# Patient Record
Sex: Female | Born: 1940 | Race: White | Hispanic: No | Marital: Married | Smoking: Never smoker
Health system: Southern US, Community
[De-identification: ages and names within clinical notes are randomized; demographics above are authoritative.]

## PROBLEM LIST (undated history)

## (undated) DIAGNOSIS — K5790 Diverticulosis of intestine, part unspecified, without perforation or abscess without bleeding: Secondary | ICD-10-CM

## (undated) DIAGNOSIS — F32A Depression, unspecified: Secondary | ICD-10-CM

## (undated) DIAGNOSIS — F419 Anxiety disorder, unspecified: Secondary | ICD-10-CM

## (undated) DIAGNOSIS — K449 Diaphragmatic hernia without obstruction or gangrene: Secondary | ICD-10-CM

## (undated) DIAGNOSIS — A159 Respiratory tuberculosis unspecified: Secondary | ICD-10-CM

## (undated) DIAGNOSIS — K317 Polyp of stomach and duodenum: Secondary | ICD-10-CM

## (undated) DIAGNOSIS — K635 Polyp of colon: Secondary | ICD-10-CM

## (undated) DIAGNOSIS — Z9289 Personal history of other medical treatment: Secondary | ICD-10-CM

## (undated) DIAGNOSIS — C4491 Basal cell carcinoma of skin, unspecified: Secondary | ICD-10-CM

## (undated) DIAGNOSIS — M797 Fibromyalgia: Secondary | ICD-10-CM

## (undated) DIAGNOSIS — E669 Obesity, unspecified: Secondary | ICD-10-CM

## (undated) DIAGNOSIS — K2210 Ulcer of esophagus without bleeding: Secondary | ICD-10-CM

## (undated) DIAGNOSIS — I7300 Raynaud's syndrome without gangrene: Secondary | ICD-10-CM

## (undated) DIAGNOSIS — K219 Gastro-esophageal reflux disease without esophagitis: Secondary | ICD-10-CM

## (undated) DIAGNOSIS — E039 Hypothyroidism, unspecified: Secondary | ICD-10-CM

## (undated) DIAGNOSIS — I10 Essential (primary) hypertension: Secondary | ICD-10-CM

## (undated) DIAGNOSIS — E785 Hyperlipidemia, unspecified: Secondary | ICD-10-CM

## (undated) DIAGNOSIS — F329 Major depressive disorder, single episode, unspecified: Secondary | ICD-10-CM

## (undated) DIAGNOSIS — R112 Nausea with vomiting, unspecified: Secondary | ICD-10-CM

## (undated) DIAGNOSIS — Z8249 Family history of ischemic heart disease and other diseases of the circulatory system: Secondary | ICD-10-CM

## (undated) DIAGNOSIS — E538 Deficiency of other specified B group vitamins: Secondary | ICD-10-CM

## (undated) DIAGNOSIS — G4730 Sleep apnea, unspecified: Secondary | ICD-10-CM

## (undated) DIAGNOSIS — G894 Chronic pain syndrome: Secondary | ICD-10-CM

## (undated) DIAGNOSIS — R0600 Dyspnea, unspecified: Secondary | ICD-10-CM

## (undated) DIAGNOSIS — M1990 Unspecified osteoarthritis, unspecified site: Secondary | ICD-10-CM

## (undated) DIAGNOSIS — M25551 Pain in right hip: Secondary | ICD-10-CM

## (undated) DIAGNOSIS — G588 Other specified mononeuropathies: Secondary | ICD-10-CM

## (undated) DIAGNOSIS — N3010 Interstitial cystitis (chronic) without hematuria: Secondary | ICD-10-CM

## (undated) DIAGNOSIS — E063 Autoimmune thyroiditis: Secondary | ICD-10-CM

## (undated) DIAGNOSIS — Z9889 Other specified postprocedural states: Secondary | ICD-10-CM

## (undated) HISTORY — DX: Gastro-esophageal reflux disease without esophagitis: K21.9

## (undated) HISTORY — DX: Interstitial cystitis (chronic) without hematuria: N30.10

## (undated) HISTORY — DX: Chronic pain syndrome: G89.4

## (undated) HISTORY — DX: Depression, unspecified: F32.A

## (undated) HISTORY — DX: Unspecified osteoarthritis, unspecified site: M19.90

## (undated) HISTORY — DX: Essential (primary) hypertension: I10

## (undated) HISTORY — DX: Autoimmune thyroiditis: E06.3

## (undated) HISTORY — DX: Obesity, unspecified: E66.9

## (undated) HISTORY — DX: Basal cell carcinoma of skin, unspecified: C44.91

## (undated) HISTORY — PX: LIPOMA EXCISION: SHX5283

## (undated) HISTORY — DX: Ulcer of esophagus without bleeding: K22.10

## (undated) HISTORY — DX: Other specified mononeuropathies: G58.8

## (undated) HISTORY — DX: Personal history of other medical treatment: Z92.89

## (undated) HISTORY — DX: Raynaud's syndrome without gangrene: I73.00

## (undated) HISTORY — DX: Hyperlipidemia, unspecified: E78.5

## (undated) HISTORY — DX: Polyp of colon: K63.5

## (undated) HISTORY — DX: Pain in right hip: M25.551

## (undated) HISTORY — DX: Diverticulosis of intestine, part unspecified, without perforation or abscess without bleeding: K57.90

## (undated) HISTORY — DX: Hypothyroidism, unspecified: E03.9

## (undated) HISTORY — DX: Deficiency of other specified B group vitamins: E53.8

## (undated) HISTORY — DX: Diaphragmatic hernia without obstruction or gangrene: K44.9

## (undated) HISTORY — DX: Polyp of stomach and duodenum: K31.7

## (undated) HISTORY — DX: Major depressive disorder, single episode, unspecified: F32.9

## (undated) HISTORY — DX: Fibromyalgia: M79.7

## (undated) HISTORY — DX: Family history of ischemic heart disease and other diseases of the circulatory system: Z82.49

## (undated) HISTORY — DX: Anxiety disorder, unspecified: F41.9

## (undated) HISTORY — PX: CATARACT EXTRACTION: SUR2

## (undated) HISTORY — DX: Sleep apnea, unspecified: G47.30

---
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
## 1980-05-21 HISTORY — PX: ABDOMINAL HYSTERECTOMY: SHX81

## 1997-05-21 HISTORY — PX: FOOT SURGERY: SHX648

## 1998-02-07 ENCOUNTER — Observation Stay (HOSPITAL_COMMUNITY): Admission: RE | Admit: 1998-02-07 | Discharge: 1998-02-08 | Payer: Self-pay | Admitting: Specialist

## 1998-06-24 ENCOUNTER — Other Ambulatory Visit: Admission: RE | Admit: 1998-06-24 | Discharge: 1998-06-24 | Payer: Self-pay | Admitting: Obstetrics & Gynecology

## 2000-03-07 ENCOUNTER — Ambulatory Visit (HOSPITAL_COMMUNITY): Admission: RE | Admit: 2000-03-07 | Discharge: 2000-03-07 | Payer: Self-pay | Admitting: Gastroenterology

## 2000-03-27 ENCOUNTER — Encounter: Payer: Self-pay | Admitting: Internal Medicine

## 2000-04-08 ENCOUNTER — Encounter (INDEPENDENT_AMBULATORY_CARE_PROVIDER_SITE_OTHER): Payer: Self-pay | Admitting: *Deleted

## 2000-05-21 DIAGNOSIS — C4491 Basal cell carcinoma of skin, unspecified: Secondary | ICD-10-CM

## 2000-05-21 HISTORY — DX: Basal cell carcinoma of skin, unspecified: C44.91

## 2000-06-13 ENCOUNTER — Ambulatory Visit (HOSPITAL_COMMUNITY): Admission: RE | Admit: 2000-06-13 | Discharge: 2000-06-13 | Payer: Self-pay | Admitting: Gastroenterology

## 2000-08-08 ENCOUNTER — Ambulatory Visit (HOSPITAL_COMMUNITY): Admission: RE | Admit: 2000-08-08 | Discharge: 2000-08-08 | Payer: Self-pay | Admitting: Gastroenterology

## 2001-03-31 HISTORY — PX: BASAL CELL CARCINOMA EXCISION: SHX1214

## 2001-07-29 ENCOUNTER — Ambulatory Visit (HOSPITAL_COMMUNITY): Admission: RE | Admit: 2001-07-29 | Discharge: 2001-07-29 | Payer: Self-pay | Admitting: Internal Medicine

## 2001-07-29 ENCOUNTER — Encounter: Payer: Self-pay | Admitting: Internal Medicine

## 2002-07-16 ENCOUNTER — Other Ambulatory Visit: Admission: RE | Admit: 2002-07-16 | Discharge: 2002-07-16 | Payer: Self-pay | Admitting: Obstetrics and Gynecology

## 2002-12-23 ENCOUNTER — Ambulatory Visit (HOSPITAL_COMMUNITY): Admission: RE | Admit: 2002-12-23 | Discharge: 2002-12-23 | Payer: Self-pay | Admitting: Family Medicine

## 2002-12-23 ENCOUNTER — Encounter: Payer: Self-pay | Admitting: Family Medicine

## 2003-05-22 HISTORY — PX: BLADDER REPAIR: SHX76

## 2003-08-11 ENCOUNTER — Other Ambulatory Visit: Admission: RE | Admit: 2003-08-11 | Discharge: 2003-08-11 | Payer: Self-pay | Admitting: Obstetrics and Gynecology

## 2003-09-17 ENCOUNTER — Ambulatory Visit (HOSPITAL_COMMUNITY): Admission: RE | Admit: 2003-09-17 | Discharge: 2003-09-17 | Payer: Self-pay | Admitting: Gastroenterology

## 2003-10-05 ENCOUNTER — Ambulatory Visit (HOSPITAL_COMMUNITY): Admission: RE | Admit: 2003-10-05 | Discharge: 2003-10-05 | Payer: Self-pay | Admitting: General Surgery

## 2003-10-05 ENCOUNTER — Encounter (INDEPENDENT_AMBULATORY_CARE_PROVIDER_SITE_OTHER): Payer: Self-pay | Admitting: Specialist

## 2004-03-14 ENCOUNTER — Ambulatory Visit (HOSPITAL_COMMUNITY): Admission: RE | Admit: 2004-03-14 | Discharge: 2004-03-14 | Payer: Self-pay | Admitting: Family Medicine

## 2004-07-19 ENCOUNTER — Ambulatory Visit: Payer: Self-pay | Admitting: Gastroenterology

## 2004-08-08 ENCOUNTER — Ambulatory Visit: Payer: Self-pay | Admitting: Gastroenterology

## 2006-01-14 ENCOUNTER — Ambulatory Visit: Payer: Self-pay | Admitting: Gastroenterology

## 2006-01-23 ENCOUNTER — Ambulatory Visit: Payer: Self-pay | Admitting: Gastroenterology

## 2006-01-28 ENCOUNTER — Ambulatory Visit: Payer: Self-pay | Admitting: Gastroenterology

## 2006-01-28 ENCOUNTER — Encounter: Payer: Self-pay | Admitting: Internal Medicine

## 2006-02-11 ENCOUNTER — Ambulatory Visit: Payer: Self-pay | Admitting: Gastroenterology

## 2006-02-19 ENCOUNTER — Encounter: Payer: Self-pay | Admitting: Internal Medicine

## 2006-02-19 ENCOUNTER — Encounter (INDEPENDENT_AMBULATORY_CARE_PROVIDER_SITE_OTHER): Payer: Self-pay | Admitting: *Deleted

## 2006-02-19 ENCOUNTER — Ambulatory Visit: Payer: Self-pay | Admitting: Gastroenterology

## 2006-03-04 ENCOUNTER — Ambulatory Visit: Payer: Self-pay | Admitting: Gastroenterology

## 2006-04-03 ENCOUNTER — Ambulatory Visit: Payer: Self-pay | Admitting: Gastroenterology

## 2006-04-03 LAB — CONVERTED CEMR LAB
CO2: 33 meq/L — ABNORMAL HIGH (ref 19–32)
Calcium: 9.4 mg/dL (ref 8.4–10.5)
Chloride: 101 meq/L (ref 96–112)
Creatinine, Ser: 1 mg/dL (ref 0.4–1.2)
Glucose, Bld: 97 mg/dL (ref 70–99)
Potassium: 3 meq/L — ABNORMAL LOW (ref 3.5–5.1)

## 2006-04-04 ENCOUNTER — Ambulatory Visit: Payer: Self-pay | Admitting: Gastroenterology

## 2006-04-16 ENCOUNTER — Ambulatory Visit: Payer: Self-pay | Admitting: Gastroenterology

## 2006-11-19 ENCOUNTER — Ambulatory Visit (HOSPITAL_COMMUNITY): Admission: RE | Admit: 2006-11-19 | Discharge: 2006-11-19 | Payer: Self-pay | Admitting: *Deleted

## 2006-12-20 HISTORY — PX: TRANSTHORACIC ECHOCARDIOGRAM: SHX275

## 2007-03-18 ENCOUNTER — Encounter (HOSPITAL_COMMUNITY): Admission: RE | Admit: 2007-03-18 | Discharge: 2007-04-17 | Payer: Self-pay | Admitting: Family Medicine

## 2007-04-22 ENCOUNTER — Encounter (HOSPITAL_COMMUNITY): Admission: RE | Admit: 2007-04-22 | Discharge: 2007-05-21 | Payer: Self-pay | Admitting: Family Medicine

## 2007-07-01 ENCOUNTER — Ambulatory Visit (HOSPITAL_COMMUNITY): Admission: RE | Admit: 2007-07-01 | Discharge: 2007-07-01 | Payer: Self-pay | Admitting: Family Medicine

## 2008-01-12 ENCOUNTER — Ambulatory Visit (HOSPITAL_COMMUNITY): Admission: RE | Admit: 2008-01-12 | Discharge: 2008-01-12 | Payer: Self-pay | Admitting: Family Medicine

## 2008-08-23 ENCOUNTER — Encounter (INDEPENDENT_AMBULATORY_CARE_PROVIDER_SITE_OTHER): Payer: Self-pay | Admitting: *Deleted

## 2009-01-19 HISTORY — PX: BLADDER SURGERY: SHX569

## 2009-02-15 ENCOUNTER — Ambulatory Visit (HOSPITAL_COMMUNITY): Admission: RE | Admit: 2009-02-15 | Discharge: 2009-02-16 | Payer: Self-pay | Admitting: Obstetrics and Gynecology

## 2009-02-18 ENCOUNTER — Ambulatory Visit (HOSPITAL_COMMUNITY): Admission: RE | Admit: 2009-02-18 | Discharge: 2009-02-18 | Payer: Self-pay | Admitting: Obstetrics and Gynecology

## 2009-08-16 ENCOUNTER — Ambulatory Visit (HOSPITAL_COMMUNITY): Admission: RE | Admit: 2009-08-16 | Discharge: 2009-08-16 | Payer: Self-pay | Admitting: Family Medicine

## 2009-09-07 ENCOUNTER — Telehealth (INDEPENDENT_AMBULATORY_CARE_PROVIDER_SITE_OTHER): Payer: Self-pay | Admitting: *Deleted

## 2009-10-21 ENCOUNTER — Encounter (INDEPENDENT_AMBULATORY_CARE_PROVIDER_SITE_OTHER): Payer: Self-pay | Admitting: *Deleted

## 2009-11-29 ENCOUNTER — Ambulatory Visit: Payer: Self-pay | Admitting: Internal Medicine

## 2009-11-29 DIAGNOSIS — K219 Gastro-esophageal reflux disease without esophagitis: Secondary | ICD-10-CM | POA: Insufficient documentation

## 2009-11-29 DIAGNOSIS — Z860100 Personal history of colon polyps, unspecified: Secondary | ICD-10-CM | POA: Insufficient documentation

## 2009-11-29 DIAGNOSIS — R1319 Other dysphagia: Secondary | ICD-10-CM

## 2009-12-02 ENCOUNTER — Telehealth: Payer: Self-pay | Admitting: Internal Medicine

## 2009-12-02 ENCOUNTER — Encounter (INDEPENDENT_AMBULATORY_CARE_PROVIDER_SITE_OTHER): Payer: Self-pay | Admitting: *Deleted

## 2009-12-09 ENCOUNTER — Encounter (INDEPENDENT_AMBULATORY_CARE_PROVIDER_SITE_OTHER): Payer: Self-pay | Admitting: *Deleted

## 2009-12-16 ENCOUNTER — Ambulatory Visit: Payer: Self-pay | Admitting: Internal Medicine

## 2009-12-20 ENCOUNTER — Ambulatory Visit: Payer: Self-pay | Admitting: Internal Medicine

## 2009-12-20 HISTORY — PX: UPPER GASTROINTESTINAL ENDOSCOPY: SHX188

## 2009-12-20 HISTORY — PX: COLONOSCOPY W/ BIOPSIES AND POLYPECTOMY: SHX1376

## 2009-12-23 ENCOUNTER — Encounter: Payer: Self-pay | Admitting: Internal Medicine

## 2010-04-20 DIAGNOSIS — Z9289 Personal history of other medical treatment: Secondary | ICD-10-CM

## 2010-04-20 HISTORY — DX: Personal history of other medical treatment: Z92.89

## 2010-05-25 HISTORY — PX: OTHER SURGICAL HISTORY: SHX169

## 2010-06-10 ENCOUNTER — Encounter: Payer: Self-pay | Admitting: Family Medicine

## 2010-06-20 NOTE — Procedures (Signed)
Summary: EGD: Dr. Doreatha Martin   EGD  Procedure date:  02/19/2006  Findings:      Location: [facility]  Findings: Benign Fundic gland and hyperplatic polyps   Patient Name: Audrey [name], Audrey [name] MRN: [id number] Procedure Procedures: Panendoscopy (EGD) CPT: 43235.    with biopsy(s)/brushing(s). CPT: D1846139.  Personnel: Endoscopist: Ulyess Mort, MD.  Referred By: Anderson Malta, DO.  Exam Location: Exam performed in Outpatient Clinic. Outpatient  Patient Consent: Procedure, Alternatives, Risks and Benefits discussed, consent obtained, from patient. Consent was obtained by the RN.  Indications Symptoms: Dysphagia. Dyspepsia, Abdominal pain,  History  Current Medications: Patient is not currently taking Coumadin.  Medical/Surgical History: Possible von willebrand's ,  Allergies: No known allergies.  Pre-Exam Physical: Entire physical exam was normal. Abnormal PE findings include: cushinoid .  Comments: Pt. history reviewed/updated, physical exam performed prior to initiation of sedation? Exam Exam Info: Maximum depth of insertion Duodenum, intended Duodenum. Patient position: on left side. Vocal cords visualized. Gastric retroflexion performed. Images taken. ASA Classification: II. Tolerance: good.  Sedation Meds: Patient assessed and found to be appropriate for moderate (conscious) sedation. Fentanyl 75 mcg. given IV. Versed 9 mg. given IV. Cetacaine Spray 2 sprays given aerosolized.  Monitoring: BP and pulse monitoring done. Oximetry used. Supplemental O2 given  Findings - HIATAL HERNIA: 3 cms. in length. mild stricture.  - MUCOSAL ABNORMALITY: Pyloric Sphincter to Jejunum. Granular mucosa.  POLYP: in Body. Maximum size: 6 mm. sessile polyp. Procedure: biopsy without cautery, Polyp sent to pathology. 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  POLYP: in Antrum. Maximum size: 12 mm. sessile polyp. Procedure: biopsy without cautery, not removed, sent to  pathology. Comments: submucosal lesion  bxs-5.  - MUCOSAL ABNORMALITY: Body to Antrum. Granular mucosa.   Assessment Abnormal examination, see findings above.  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   Events  Unplanned Intervention: No unplanned interventions were required.  Unplanned Events: There were no complications. Plans Medication(s): Await pathology. Continue current medications. PPI:   Patient Education: Patient given standard instructions for: Hiatal Hernia. Reflux. Stenosis / Stricture. Mucosal Abnormality. Polyps.  Disposition: After procedure patient sent to recovery. After recovery patient sent home.   CC:   Anderson Malta, MD   Arlan Organ, MD  This report was created from the original endoscopy report, which was reviewed and signed by the above listed endoscopist.

## 2010-06-20 NOTE — Procedures (Signed)
Summary: Colonoscopy  Patient: [name] Note: All result statuses are Final unless otherwise noted.  Tests: (1) Colonoscopy (COL)   COL Colonoscopy           DONE     [facility]     [address]     [place], [place]  [address]           COLONOSCOPY PROCEDURE REPORT           PATIENT:  Audrey, [name]  MR#:  [id number]     BIRTHDATE:  1940/10/12, 68 yrs. old  GENDER:  female     ENDOSCOPIST:  Iva Boop, MD, [facility]           PROCEDURE DATE:  12/20/2009     PROCEDURE:  Colonoscopy with snare polypectomy     ASA CLASS:  Class II     INDICATIONS:  surveillance and high-risk screening, history of     polyps two polyps removed/destroyed in 2006     MEDICATIONS:   There was residual sedation effect present from     prior procedure., Versed 2 mg IV           DESCRIPTION OF PROCEDURE:   After the risks benefits and     alternatives of the procedure were thoroughly explained, informed     consent was obtained.  Digital rectal exam was performed and     revealed no abnormalities.   The LB CF-[id number] endoscope     was introduced through the anus and advanced to the cecum, which     was identified by both the appendix and ileocecal valve, without     limitations.  The quality of the prep was excellent, using     MoviPrep.  The instrument was then slowly withdrawn as the colon     was fully examined. Insertion: : 4:04 minutes Withdrawal: 9:35     minutes     <<PROCEDUREIMAGES>>           FINDINGS:  Two polyps were found in the ascending colon. 6mm and     1mm polyps. Polyps were snared without cautery. Retrieval was     successful. snare polyp  Moderate diverticulosis was found in the     sigmoid colon.  This was otherwise a normal examination of the     colon.   Retroflexed views in the rectum revealed no     abnormalities.    The scope was then withdrawn from the patient     and the procedure completed.           COMPLICATIONS:  None     ENDOSCOPIC IMPRESSION:   1) Two polyps in the ascending colon (1 and 6 mm polyps removed)           2) Moderate diverticulosis in the sigmoid colon     3) Otherwise normal examination, excellent prep     4) Prior polyp removal (2) in 2006, maximum size 8mm.           REPEAT EXAM:  In for Colonoscopy, pending biopsy results.           Iva Boop, MD, [place]            CC:  Assunta Found, MD     The Patient           n.     eSIGNED:   Iva Boop at 12/20/2009 04:53 PM  Audrey, [name], [id number]  Note: An exclamation mark (!) indicates a result that was not dispersed into the flowsheet. Document Creation Date: 12/20/2009 4:53 PM _______________________________________________________________________  (1) Order result status: Final Collection or observation date-time: 12/20/2009 16:36 Requested date-time:  Receipt date-time:  Reported date-time:  Referring Physician:   Ordering Physician: Stan Head [phone]) Specimen Source:  Source: Launa Grill Order Number: [id number] Lab site:   Appended Document: Colonoscopy     Procedures Next Due Date:    Colonoscopy: 12/2014

## 2010-06-20 NOTE — Letter (Signed)
Summary: New Patient letter  [facility]  [address] [place], [place] [address]   Phone: [phone]  Fax: [phone]       10/21/2009 MRN: [id number]  [facility] [place] [address] [place] [address] [place]  [address]  [place]  Dear Ms. Condon,  Welcome to the Gastroenterology Division at [facility].    You are scheduled to see Dr. Leone Payor on  11/29/2009 at 2:45pm on the 3rd floor at [facility], [address]  We ask that you try to arrive at our office 15 minutes prior to your appointment time to allow for check-in.  We would like you to complete the enclosed self-administered evaluation form prior to your visit and bring it with you on the day of your appointment.  We will review it with you.  Also, please bring a complete list of all your medications or, if you prefer, bring the medication bottles and we will list them.  Please bring your insurance card so that we may make a copy of it.  If your insurance requires a referral to see a specialist, please bring your referral form from your primary care physician.  Co-payments are due at the time of your visit and may be paid by cash, check or credit card.     Your office visit will consist of a consult with your physician (includes a physical exam), any laboratory testing he/she may order, scheduling of any necessary diagnostic testing (e.g. x-ray, ultrasound, CT-scan), and scheduling of a procedure (e.g. Endoscopy, Colonoscopy) if required.  Please allow enough time on your schedule to allow for any/all of these possibilities.    If you cannot keep your appointment, please call [phone] to cancel or reschedule prior to your appointment date.  This allows [place] the opportunity to schedule an appointment for another patient in need of care.  If you do not cancel or reschedule by 5 p.m. the business day prior to your appointment date, you will be charged a $50.00 late cancellation/no-show fee.    Thank you for choosing  [facility] for your medical needs.  We appreciate the opportunity to care for you.  Please visit [place] at our website  to learn more about our practice.                     Sincerely,                                                             The Gastroenterology Division

## 2010-06-20 NOTE — Letter (Signed)
Summary: Patient Notice- Polyp Results  [facility]  [address] [place], [place] [address]   Phone: [phone]  Fax: [phone]        December 23, 2009 MRN: [id number]    [facility] [place] [address] [place] [address] [place]  [address]    Dear Ms. Swiney,  The polyp removed from your colon was adenomatous. This means that it was pre-cancerous or that  it had the potential to change into cancer over time.  I recommend that you have a repeat colonoscopy in 5 years to determine if you have developed any new polyps over time. If you develop any new rectal bleeding, abdominal pain or significant bowel habit changes, please contact us before then.  The esophageal biopsies demonstrated some changes from acid reflux and the stomach biopsies were ok also, no cancer or pre-cancerous changes.  Let's see how you do on the higher dose of omeprazole and hopefully, dilating your esophagus helped. If it did not or you are having other problems, let me know.  Good luck with your bladder surgery!  Sincerely,  Iva Boop MD, [facility]  This letter has been electronically signed by your physician.  Appended Document: Patient Notice- Polyp Results letter mailed

## 2010-06-20 NOTE — Assessment & Plan Note (Signed)
Summary: discuss colonoscopy and pt wants egd, recall project/lk   History of Present Illness Visit Type: new patient  Primary GI MD: Stan Head MD [facility] Primary Provider: Assunta Found, MD  Requesting Provider: na Chief Complaint: Dysphagia  History of Present Illness:   70 yo ww with IBS and GERD, previously followed by Dr. Victorino Dike. Solid-food dysphagia intermittently, especially with meats, hanging and moving through slowly. Also with frequent burping and belching and sometimes she has heartburn. Has been on Prilosec 40 mg daily for some time.   She has to stay on an antibiotic to treat eroded mesh into the bladder related to prior prolapse surgery. Husband recently had surgery for lung cancer thought to be curative. She is going to need surgery on her bladder, most likely.   GI Review of Systems    Reports acid reflux, belching, bloating, dysphagia with solids, and  heartburn.      Denies abdominal pain, chest pain, dysphagia with liquids, loss of appetite, nausea, vomiting, vomiting blood, weight loss, and  weight gain.        Denies anal fissure, black tarry stools, change in bowel habit, constipation, diarrhea, diverticulosis, fecal incontinence, heme positive stool, hemorrhoids, irritable bowel syndrome, jaundice, light color stool, liver problems, rectal bleeding, and  rectal pain.    EGD  Procedure date:  02/19/2006  Findings:      Fundic gland polyps  Colonoscopy  Procedure date:  08/08/2004  Findings:      Two polyps removed: 3mm and 8mm, no pathology specimens Diverticulosis  Colonoscopy  Procedure date:  08/08/2000  Findings:      Diverticulosis  External hemorrhoids  Procedures Next Due Date:    Colonoscopy: 08/2009   Current Medications (verified): 1)  Savella 50 Mg Tabs (Milnacipran Hcl) .... One Tablet By By Mouth in The Morning and One Tablet By Mouth At Night 2)  Lasix 40 Mg Tabs (Furosemide) .... One Tablet To Two Tablets  By Mouth  Once Daily 3)  Prilosec 20 Mg Cpdr (Omeprazole) .... One Tablet By Mouth Once Daily 4)  Hydrochlorothiazide 25 Mg Tabs (Hydrochlorothiazide) .... One Tablet By Mouth Once Daily 5)  Neurontin 300 Mg Caps (Gabapentin) .... One Tablet By Mouth Three Times A Day 6)  Potassium Chloride 20 Meq Pack (Potassium Chloride) .... One Tablet By Mouth Once Daily 7)  Multivitamins   Tabs (Multiple Vitamin) .... One Tablet By Mouth Once Daily 8)  Levothyroxine Sodium 25 Mcg Tabs (Levothyroxine Sodium) .... One Tablet By Mouth Once Daily 9)  Vitamin D (Ergocalciferol) 50000 Unit Caps (Ergocalciferol) .... One Tablet By Mouth Once A Week 10)  Bystolic 5 Mg Tabs (Nebivolol Hcl) .... One Tablet By Mouth Once Daily 11)  Macrobid 100 Mg Caps (Nitrofurantoin Monohyd Macro) .... One Tablet By Mouth Once Daily At Bedtime 12)  Oxygen .... At Bedtime 13)  Librium 10mg  .... One Tablet By Mouth As Needed  Allergies (verified): 1)  ! Erythromycin 2)  ! * Adhesives 3)  ! * Monitor Pad 4)  ! * Band-Aids  Past History:  Past Medical History: Allergies Anxiety Disorder Arthritis Depression GERD Obesity colon polyps gastric polyps Diverticulosis Hyperlipidemia Hypertension Thyroid  Disease Raynaud's ?Von Willerbrands Menieres Disease    Past Surgical History: Hysterectomy Tonsillectomy Cataract Bilateral  Bladder Repair   Family History: Family History of Pancreatic Cancer:Mother  Family History of Prostate Cancer:Father  Family History of Colon Polyps:Father  Family History of Clotting disorder: Oldest Daughter, and Three Grandchildern  Family History of /Crohn's:  Granddaughter  Family History of Diabetes: Mother, Father, Brother and MGM Family History of Heart Disease: Father  Family History of Kidney Disease: Father  Family History of Colon  ? Maternal Uncle  Family History of Breast Cancer: Maternal Aunt   Social History: Retired Married Childern  Patient has never smoked.  Alcohol Use  - no Illicit Drug Use - no Smoking Status:  never Drug Use:  no  Review of Systems       The patient complains of allergy/sinus, back pain, fatigue, muscle pains/cramps, swelling of feet/legs, and urination changes/pain.         All other ROS negative except as per HPI.   Vital Signs:  Patient profile:   70 year old female Height:      61 inches Weight:      134 pounds BMI:     25.41 BSA:     1.59 Pulse rate:   76 / minute Pulse rhythm:   regular BP sitting:   124 / 62  (left arm) Cuff size:   regular  Vitals Entered By: Ok Anis CMA (November 29, 2009 2:53 PM)  Physical Exam  General:  Well developed, well nourished, no acute distress. Eyes:  anicteric Mouth:  clear Neck:  supple Lungs:  Clear throughout to auscultation. Heart:  Regular rate and rhythm; no murmurs, rubs,  or bruits. Abdomen:  Soft, nontender and nondistended. No masses, hepatosplenomegaly or hernias noted. Normal bowel sounds. Rectal:  deferred until time of colonoscopy.   Extremities:  no edema TED stocking on left ankle Psych:  Alert and cooperative. Normal mood and affect.   Impression & Recommendations:  Problem # 1:  OTHER DYSPHAGIA (ICD-787.29) Assessment New Intermittent solid food dysphagia sounds like a ring in esophagus needs EGD and possible esophageal dilation  she will find out when bladder surgery is and then schedule this Risks, benefits,and indications of endoscopic procedure(s) were reviewed with the patient and all questions answered. she has and will modify diet in interim + omeprazole increased from 20 to 40 mg daily  Problem # 2:  GERD (ICD-530.81) Assessment: Unchanged stay on PPI, increase to 40 mg omeprazole once daily may need to adjust once EGD results back  Problem # 3:  ? 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 Dr. Myna Hidalgo evaluated her in 2001 and her  tests did not meet criteria, however. She does not need DDAVP and has tolerated colonoscopic abd gastric polypectomy as well as bladder + other surgerise without DDAVP.  Problem # 4:  COLONIC POLYPS, BENIGN, HX OF (ICD-V12.72) Assessment: Unchanged due for a screening and surveillance exam. No polyp pathology but had an 8 mm and 3 mm polyp removed in 2006. If no polyps this time, 7-10 years for next routine exam, I think. She will call us about scheduling after she learns of bladder surgery timiing. Risks, benefits,and indications of endoscopic procedure(s) were reviewed with the patient and all questions answered.  Problem # 5:  SCREENING, COLON CANCER (ICD-V76.51) Assessment: Unchanged  Patient Instructions: 1)  Please pick up your medications at your pharmacy. OMEPRAZOLE 2)  Increase omeprazole is directed below.  You may take two 20mg  tablets until they run out. 3)  Please call back to schedule your procedures.   4)  The medication list was reviewed and reconciled.  All changed / newly prescribed medications were explained.  A complete medication list was provided to  the patient / caregiver. Prescriptions: OMEPRAZOLE 40 MG CPDR (OMEPRAZOLE) 1 by mouth each morning 30-60 mnutes before breakfast  #30 x 11   Entered and Authorized by:   Iva Boop MD, [facility]   Signed by:   Iva Boop MD, [facility] on 11/29/2009   Method used:   Electronically to        [facility]* (retail)       [address] [place], [place]  [address]       Ph: [phone]       Fax: [phone]   RxID:   [id number]   Appended Document: discuss colonoscopy and pt wants egd, recall project/lk cc Assunta Found, MD and Lorin Picket McDiarmid, MD

## 2010-06-20 NOTE — Letter (Signed)
[facility]   Imported By: Sherian Rein 12/01/2009 13:15:40  _____________________________________________________________________  External Attachment:    Type:   Image     Comment:   External Document

## 2010-06-20 NOTE — Miscellaneous (Signed)
Summary: [facility] Previsit/prep  Clinical Lists Changes  Medications: Added new medication of MOVIPREP 100 GM  SOLR (PEG-KCL-NACL-NASULF-NA ASC-C) As per prep instructions. - Signed Rx of MOVIPREP 100 GM  SOLR (PEG-KCL-NACL-NASULF-NA ASC-C) As per prep instructions.;  #1 x 0;  Signed;  Entered by: Wyona Almas RN;  Authorized by: Iva Boop MD, [facility];  Method used: Electronically to [facility] [address], [place], [place], [place]  [address], Ph: [phone], Fax: [phone] Observations: Added new observation of ALLERGY REV: Done (12/16/2009 12:34)    Prescriptions: MOVIPREP 100 GM  SOLR (PEG-KCL-NACL-NASULF-NA ASC-C) As per prep instructions.  #1 x 0   Entered by:   Wyona Almas RN   Authorized by:   Iva Boop MD, [facility]   Signed by:   Wyona Almas RN on 12/16/2009   Method used:   Electronically to        [facility] (retail)       [address]       [place], [place]  [address]       Ph: [phone]       Fax: [phone]   RxID:   [id number]

## 2010-06-20 NOTE — Letter (Signed)
Summary: [facility] Instructions  [facility]  [address] [place], [place] [address]   Phone: [phone]  Fax: [phone]       Audrey [name]    August 27, 1968    MRN: [id number]        Procedure Day /Date:  12/20/09  Tuesday     Arrival Time:  2:30pm      Procedure Time:  3:30pm     Location of Procedure:                    _x _   Endoscopy Center (4th Floor)                        PREPARATION FOR COLONOSCOPY WITH MOVIPREP   Starting 5 days prior to your procedure 12/15/09 _ do not eat nuts, seeds, popcorn, corn, beans, peas,  salads, or any raw vegetables.  Do not take any fiber supplements (e.g. Metamucil, Citrucel, and Benefiber).  THE DAY BEFORE YOUR PROCEDURE         DATE:   12/19/09  DAY:  Monday  1.  Drink clear liquids the entire day-NO SOLID FOOD  2.  Do not drink anything colored red or purple.  Avoid juices with pulp.  No orange juice.  3.  Drink at least 64 oz. (8 glasses) of fluid/clear liquids during the day to prevent dehydration and help the prep work efficiently.  CLEAR LIQUIDS INCLUDE: Water Jello Ice Popsicles Tea (sugar ok, no milk/cream) Powdered fruit flavored drinks Coffee (sugar ok, no milk/cream) Gatorade Juice: apple, white grape, white cranberry  Lemonade Clear bullion, consomm, broth Carbonated beverages (any kind) Strained chicken noodle soup Hard Candy                             4.  In the morning, mix first dose of MoviPrep solution:    Empty 1 Pouch A and 1 Pouch B into the disposable container    Add lukewarm drinking water to the top line of the container. Mix to dissolve    Refrigerate (mixed solution should be used within 24 hrs)  5.  Begin drinking the prep at 5:00 p.m. The MoviPrep container is divided by 4 marks.   Every 15 minutes drink the solution down to the next mark (approximately 8 oz) until the full liter is complete.   6.  Follow completed prep with 16 oz of clear liquid of your choice  (Nothing red or purple).  Continue to drink clear liquids until bedtime.  7.  Before going to bed, mix second dose of MoviPrep solution:    Empty 1 Pouch A and 1 Pouch B into the disposable container    Add lukewarm drinking water to the top line of the container. Mix to dissolve    Refrigerate  THE DAY OF YOUR PROCEDURE      DATE:   12/20/09  DAY:  Tuesday  Beginning at 10:30 a.m. (5 hours before procedure):         1. Every 15 minutes, drink the solution down to the next mark (approx 8 oz) until the full liter is complete.  2. Follow completed prep with 16 oz. of clear liquid of your choice.    3. You may drink clear liquids until 1:30pm  (2 HOURS BEFORE PROCEDURE).   MEDICATION INSTRUCTIONS  Unless otherwise instructed, you should take regular prescription medications with a small sip  of water   as early as possible the morning of your procedure.    Additional medication instructions:   Hold Lasix and HCTZ the morning of procedure.         OTHER INSTRUCTIONS  You will need a responsible adult at least 70 years of age to accompany you and drive you home.   This person must remain in the waiting room during your procedure.  Wear loose fitting clothing that is easily removed.  Leave jewelry and other valuables at home.  However, you may wish to bring a book to read or  an iPod/MP3 player to listen to music as you wait for your procedure to start.  Remove all body piercing jewelry and leave at home.  Total time from sign-in until discharge is approximately 2-3 hours.  You should go home directly after your procedure and rest.  You can resume normal activities the  day after your procedure.  The day of your procedure you should not:   Drive   Make legal decisions   Operate machinery   Drink alcohol   Return to work  You will receive specific instructions about eating, activities and medications before you leave.    The above instructions have been  reviewed and explained to me by   Wyona Almas RN  December 16, 2009 1:33 PM     I fully understand and can verbalize these instructions _____________________________ Date _________

## 2010-06-20 NOTE — Progress Notes (Signed)
Summary: Schedule Colonoscopy  Phone Note Outgoing Call Call back at [facility] Phone [phone]   Call placed by: Harlow Mares CMA Duncan Dull),  September 07, 2009 4:02 PM Call placed to: Patient Summary of Call: patient is due for Colonosocpy due to family hx of colon cancer.Marland KitchenMarland KitchenLeft message on patients machine to call back. patient was Dr. Corinda Gubler patient Dr. Leone Payor reviewed the chart but patient has not seen any MD in the Practice since Dr. Corinda Gubler.  Initial call taken by: Harlow Mares CMA Duncan Dull),  September 07, 2009 4:04 PM  Follow-up for Phone Call        patient dealing with alot of medical situations with herself and her husband I will flag myself and call her back in a month or so.  Follow-up by: Harlow Mares CMA Duncan Dull),  Sep 19, 2009 11:09 AM

## 2010-06-20 NOTE — Letter (Signed)
Summary: Previsit letter  [facility]  [address] [place], [place] [address]   Phone: [phone]  Fax: [phone]       12/02/2009 MRN: [id number]  [facility] [address] [place] [address], [place]  [address]  [place]  Dear Audrey [name],  Welcome to the Gastroenterology Division at [facility].    You are scheduled to see a nurse for your pre-procedure visit on December 12, 2009 at 4:00 pm on the 3rd floor at [facility], [address]  We ask that you try to arrive at our office 15 minutes prior to your appointment time to allow for check-in.  Your nurse visit will consist of discussing your medical and surgical history, your immediate family medical history, and your medications.    Please bring a complete list of all your medications or, if you prefer, bring the medication bottles and we will list them.  We will need to be aware of both prescribed and over the counter drugs.  We will need to know exact dosage information as well.  If you are on blood thinners (Coumadin, Plavix, Aggrenox, Ticlid, etc.) please call our office today/prior to your appointment, as we need to consult with your physician about holding your medication.   Please be prepared to read and sign documents such as consent forms, a financial agreement, and acknowledgement forms.  If necessary, and with your consent, a friend or relative is welcome to sit-in on the nurse visit with you.  Please bring your insurance card so that we may make a copy of it.  If your insurance requires a referral to see a specialist, please bring your referral form from your primary care physician.  No co-pay is required for this nurse visit.     If you cannot keep your appointment, please call [phone] to cancel or reschedule prior to your appointment date.  This allows [place] the opportunity to schedule an appointment for another patient in need of care.    Thank you for choosing [facility] for your  medical needs.  We appreciate the opportunity to care for you.  Please visit [place] at our website  to learn more about our practice.                     Sincerely.                                                                                                                   The Gastroenterology Division

## 2010-06-20 NOTE — Progress Notes (Signed)
Summary: ECL ?'s  Phone Note Call from Patient Call back at Home Phone [phone]   Caller: Patient Call For: Dr. Leone Payor Reason for Call: Talk to Nurse Summary of Call: would like to speak to [facility] specifically about ECL that was suggested by Dr. Leone Payor per pt Initial call taken by: Vallarie Mare,  December 02, 2009 11:05 AM  Follow-up for Phone Call        Endo-Colon scheduled for 12/20/09 @3 :30pm.  previsit on 12/12/09. Follow-up by: Francee Piccolo CMA Duncan Dull),  December 02, 2009 2:11 PM

## 2010-06-20 NOTE — Procedures (Signed)
Summary: Upper Endoscopy  Patient: Audrey [name] Note: All result statuses are Final unless otherwise noted.  Tests: (1) Upper Endoscopy (EGD)   EGD Upper Endoscopy       DONE (C)     [facility]     [address]     [place], [place]  [address]           ENDOSCOPY PROCEDURE REPORT           PATIENT:  Audrey, [name]  MR#:  [id number]     BIRTHDATE:  [birth year], 68 yrs. old  GENDER:  female           ENDOSCOPIST:  Iva Boop, MD, [facility]     Referred by:           PROCEDURE DATE:  12/20/2009     PROCEDURE:  EGD with biopsy, [name] Dilation of Esophagus     ASA CLASS:  Class II     INDICATIONS:  dysphagia           MEDICATIONS:   Fentanyl 100 mcg IV, Versed 9 mg IV     TOPICAL ANESTHETIC:  Exactacain Spray           DESCRIPTION OF PROCEDURE:   After the risks benefits and     alternatives of the procedure were thoroughly explained, informed     consent was obtained.  [facility] GIF-H180 [id number] endoscope was     introduced through the mouth and advanced to the second portion of     the duodenum, without limitations.  The instrument was slowly     withdrawn as the mucosa was fully examined.     <<PROCEDUREIMAGES>>           There were columnar mucosal changes consistent with Barrett's     esophagus identified  in the distal esophagus. 1-2 cm area above     z-line, best seen on NBI view Multiple biopsies were obtained and     sent to pathology.  Abnormal appearing mucosa in the total     esophagus. Fissuring, some whitish nodules. Raises ? of     eosinophilic esophagitis vs. spasm. Multiple biopsies were     obtained and sent to pathology.  There were multiple polyps     identified. in the body of the stomach. In fundus also. Small (5mm     max) and whitish, consistent with known fundic gland polyps.  A     sessile polyp was found in the antrum. It was 12 mm in size.     Multiple biopsies were obtained and sent to pathology.  Otherwise     the examination was normal.  Maloney dilation performed due to     dysphagia. 48 French Maloney dilator passed, trace heme seen     (biopsies had been taken).    Retroflexed views revealed     Retroflexion exam demonstrated findings as previously described.     The scope was then withdrawn from the patient and the procedure     completed.           COMPLICATIONS:  None           ENDOSCOPIC IMPRESSION:  1) Abnormal mucosa distal esophagus, ?     Barrett's (biopsied)     2) Abnormal mucosa in total esophagus, ? 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 54 Fr Maloney dilation performed     CORRECTION: 48 French           RECOMMENDATIONS:     Clear liquids until 6PM then soft foods. Normal foods tomorrow.           Will notify her re: pathology results and plans.           REPEAT EXAM:  No           Iva Boop, MD, [facility]           CC:  Assunta Found, MD     The Patient           n.     REVISED:  12/23/2009 06:12 AM     eSIGNED:   Iva Boop at 12/23/2009 06:12 AM           Nehemiah Massed, [id number]  Note: An exclamation mark (!) indicates a result that was not dispersed into the flowsheet. Document Creation Date: 12/23/2009 6:13 AM _______________________________________________________________________  (1) Order result status: Final Collection or observation date-time: 12/20/2009 16:17 Requested date-time:  Receipt date-time:  Reported date-time:  Referring Physician:   Ordering Physician: Stan Head [phone]) Specimen Source:  Source: Launa Grill Order Number: [id number] Lab site:

## 2010-06-20 NOTE — Letter (Signed)
[facility]   Imported By: Sherian Rein 09/19/2009 14:22:59  _____________________________________________________________________  External Attachment:    Type:   Image     Comment:   External Document

## 2010-08-09 ENCOUNTER — Encounter (HOSPITAL_COMMUNITY): Payer: Medicare Other | Attending: Oncology

## 2010-08-09 ENCOUNTER — Other Ambulatory Visit (HOSPITAL_COMMUNITY): Payer: Medicare Other

## 2010-08-09 ENCOUNTER — Ambulatory Visit (HOSPITAL_COMMUNITY): Payer: Self-pay | Admitting: Oncology

## 2010-08-09 ENCOUNTER — Ambulatory Visit (HOSPITAL_COMMUNITY): Payer: Medicare Other | Admitting: Oncology

## 2010-08-09 DIAGNOSIS — D6800 Von Willebrand disease, unspecified: Secondary | ICD-10-CM | POA: Insufficient documentation

## 2010-08-09 DIAGNOSIS — Z0189 Encounter for other specified special examinations: Secondary | ICD-10-CM

## 2010-08-14 ENCOUNTER — Other Ambulatory Visit (HOSPITAL_COMMUNITY): Payer: Self-pay | Admitting: Oncology

## 2010-08-14 ENCOUNTER — Other Ambulatory Visit (HOSPITAL_COMMUNITY): Payer: Medicare Other

## 2010-08-14 ENCOUNTER — Encounter (HOSPITAL_COMMUNITY): Payer: Medicare Other | Attending: Oncology

## 2010-08-14 DIAGNOSIS — D6800 Von Willebrand disease, unspecified: Secondary | ICD-10-CM | POA: Insufficient documentation

## 2010-08-14 DIAGNOSIS — D689 Coagulation defect, unspecified: Secondary | ICD-10-CM

## 2010-08-25 LAB — CBC
HCT: 40.3 % (ref 36.0–46.0)
MCHC: 33.7 g/dL (ref 30.0–36.0)
MCV: 93.1 fL (ref 78.0–100.0)
MCV: 93.1 fL (ref 78.0–100.0)
RBC: 3.25 MIL/uL — ABNORMAL LOW (ref 3.87–5.11)
RDW: 13.1 % (ref 11.5–15.5)

## 2010-08-25 LAB — COMPREHENSIVE METABOLIC PANEL
ALT: 22 U/L (ref 0–35)
Albumin: 3.8 g/dL (ref 3.5–5.2)
BUN: 10 mg/dL (ref 6–23)
CO2: 36 mEq/L — ABNORMAL HIGH (ref 19–32)
Chloride: 102 mEq/L (ref 96–112)
Creatinine, Ser: 0.77 mg/dL (ref 0.4–1.2)
GFR calc Af Amer: >60 mL/min (ref >60)
GFR calc non Af Amer: >60 mL/min (ref >60)
Glucose, Bld: 71 mg/dL (ref 70–99)

## 2010-09-12 HISTORY — PX: NASOLACRIMAL DUCT PROBING: SHX367

## 2010-09-26 HISTORY — PX: NASOLACRIMAL DUCT PROBING: SHX367

## 2010-10-06 NOTE — Procedures (Signed)
[facility]  Patient:    Audrey [name], Audrey [name]                           MRN: [id number] Proc. Date: 06/13/00 Adm. Date:  [id number] Disc. Date: [id number] Attending:  Orland Mustard CC:         Sheppard Plumber. Earlene Plater, M.D.  Luciana Axe, M.D.  Brook A. Edward Jolly, M.D.  Rose Phi. Myna Hidalgo, M.D.   Procedure Report  PROCEDURE:  Incomplete colonoscopy.  MEDICATIONS:  Fentanyl 100 mcg, Versed 10 mg IV.  SCOPE:  Olympus pediatric video colonoscope.  INDICATIONS FOR PROCEDURE:  Strong family history of colon cancer.  DESCRIPTION OF PROCEDURE:  The procedure had been explained to the patient and consent obtained. With the patient in the left lateral decubitus position, the Olympus pediatric video colonoscope was inserted and advanced under direct visualization. The patient had a very long tortuous colon. Some time was required to pass through the sigmoid. We were able to eventually advance up to the mid transverse colon as indicated by the light transilluminated in the epigastrium. Despite multiple maneuvers including abdominal pressure, the patient in the supine, right lateral decubitus, left lateral decubitus, and prone position, we were unable to advance beyond the hepatic flexure. The scope was withdrawn. The part of the transverse colon that could be seen was normal as was the splenic flexure, descending and sigmoid colon. There was no diverticular disease and no polyps. The rectum was also free of polyps. The patient tolerated the procedure well and was maintained on low flow oxygen and pulse oximeter throughout the procedure with no obvious problem.  ASSESSMENT: Colon clear to the proximal transverse colon, unable to clear the right side of the colon.  PLAN:  Will send the patient home and arrange an outpatient barium enema in the next several weeks. Recommend that we follow her clinically with a barium enema and sigmoidoscopy rather than attempting another  colonoscopy every five years due to her family history of colon cancer. DD:  06/13/00 TD:  06/13/00 Job: [id number]

## 2010-10-06 NOTE — Op Note (Signed)
NAME:  Audrey [name], Audrey [name]                            ACCOUNT NO.:  [id number]   MEDICAL RECORD NO.:  [id number]                   PATIENT TYPE:  AMB   LOCATION:  DAY                                  FACILITY:  [facility]   PHYSICIAN:  Timothy E. Earlene Plater, M.D.              DATE OF BIRTH:  07-09-1940   DATE OF PROCEDURE:  DATE OF DISCHARGE:                                 OPERATIVE REPORT   PREOPERATIVE DIAGNOSIS:  Lipoma, right arm.   POSTOPERATIVE DIAGNOSIS:  Lipoma, right arm.   PROCEDURE:  Excision of lipoma.   SURGEON:  Timothy E. Earlene Plater, M.D.   ANESTHESIA:  Local with standby.   Ms. [name] presented to my office with a new painful mass in the lateral  aspect of the right antecubital fossa.  This had the appearance and exam of  a lipoma. She wished to have it removed, so we consented her for surgery.  She was identified, and the operative site marked.   She was taken to the operating room.  IV sedation given.  The right arm was  prepped and draped.  The lipoma was both visible and palpable.  The area was  injected with 0.25% Marcaine with epinephrine.  A 3 cm horizontal incision  was made.  In just the superficial fatty tissue was a diffuse lipoma.  This  was gently teased away from the surrounding tissue.  Both internal and  external palpation revealed no other abnormality.  Bleeding was controlled  with cautery.  The wound was closed with 4-0 Vicryl and 3-0 nylon.  A dry  sterile dressing was applied.  Counts were correct.  She tolerated it well.  Was removed to the recovery room in good condition.   Written and verbal instructions given, including Darvocet-N #36.  She will  be followed from outpatient.                                               Timothy E. Earlene Plater, M.D.    TED/MEDQ  D:  10/05/2003  T:  10/05/2003  Job:  [id number]

## 2010-10-06 NOTE — Assessment & Plan Note (Signed)
[facility]                           GASTROENTEROLOGY OFFICE NOTE   NAME:Audrey [name], Audrey [name]                         MRN:          [id number]  DATE:01/28/2006                            DOB:          Sep 24, 1940    This is a very nice young lady who comes in on January 28, 2006, says she  has been having diarrhea up to 7 times a day.  Today, 4 times already.  She  says there is no blood.  Some mucus as before.  She just does not feel well.  Cipro and Flagyl were no help.  She did use some Lomotil.  She has been  having some arthritis symptoms, throat sore, feels somewhat raw.  Has some  heartburn, some dysphagia, she bends over and fluid comes up in her mouth.  Said she has more dysphagia to liquids than to solids, it seems.  She is  concerned because her granddaughter has just been diagnosed with Crohn's,  fairly severe Crohn's.  I think she probably wonders if she has somewhat of  the same kind of problem and I kind of wonder that myself with her history  of arthritis, aphthous ulcers, fatigue and reflux and diarrhea.  As we have  indicated in the past, we have worked her up fairly extensively and she did  have an upper endoscopy.  She was back to Dr. Randa Evens that did not reveal  anything of significance at the time.  This was in 2002.   PHYSICAL EXAMINATION:  She looks good for her age.  Weighs 156, blood  pressure 128/72, pulse 60 and regular.  Oropharynx reveals the coated tongue  again.  I did not see the aphthous ulcers.  There was some slight erythema  of the posterior pharyngeal area.  NECK:  Negative to palpation.  Thyroid was normal to palpation.  No  supraclavicular nodes.  CHEST:  Clear.  HEART:  Revealed a regular rhythm without significant murmur.  ABDOMEN:  Soft.  No masses or organomegaly.  Nontender.   IMPRESSION:  1. Diarrhea of questionable etiology.  2. Mild obesity.  3. Gastroesophageal reflux disease.  4. Arthritis.  5.  Anxiety and depression.  6. History of sinus trouble and allergies.  7. Status post hysterectomy.   FAMILY HISTORY:  Positive for cancer and for Crohn's disease.   RECOMMENDATIONS AND DISPOSITION:  Do a Prometheus First Step diagnostic  study on her.  Start her on some low dose prednisone 20 mg and put her on  some AcipHex samples and try to get her off the Prevacid.  See if this might  change her bowel activity because Prevacid has been known to cause diarrhea.  Will consider doing a CPE on her, which is a capsule endoscopy.  We have  scheduled her for the above followup.  I think she certainly could have  functional diarrhea but I do not really think so.  It almost sounds like she  could have some inflammatory bowel disease.  Will see how the steroids  affect her symptoms.  Ulyess Mort, MD   SML/MedQ  DD:  01/28/2006  DT:  01/29/2006  Job #:  [id number]

## 2010-10-06 NOTE — Assessment & Plan Note (Signed)
[facility]                           GASTROENTEROLOGY OFFICE NOTE   NAME:Audrey, NYJA [name]                         MRN:          [id number]  DATE:04/03/2006                            DOB:          [birth year]    Audrey [name] comes in on November 14, she says she has gone to the bathroom 4 times  before noon today.  The stools are formed a little since she is taking the  Xifaxan.  She still has urgency, and she has seen some blood in the past, it  was bright red blood per rectum, it was not to any great degree.  She has  been having pain in her right lower quadrant, which has gone into her back.   PHYSICAL EXAMINATION:  She weighs 169, blood pressure 130/82, pulse 72 and  regular.  OROPHARYNX:  Negative.  NECK:  Negative.  CHEST:  Clear.  HEART:  Revealed a regular rhythm.  ABDOMEN:  Soft, no masses or organomegaly.  However, it was tender on  palpation of the right lower quadrant.  RECTAL:  Deferred.  EXTREMITIES:  Unremarkable.   IMPRESSION:  1. Right lower quadrant pain of questionable etiology, rule out Crohn's      disease.  2. Diarrhea of questionable etiology.  3. Mild obesity.  4. History of gastroesophageal reflux disease.  5. Arthritis.  6. Anxiety and depression.  7. Status post hysterectomy.  8. Allergies and sinus disorder.   RECOMMENDATIONS:  Get a CT of her abdomen to better assess this significant  pain that she is talking about, and to change to bowel activity I told her  to keep some Protonix to take 1 q.a.m., some Flora-Q, and I also am going to  get a celiac sprue profile, and I will see her back in followup.     Ulyess Mort, MD  Electronically Signed    SML/MedQ  DD: 04/04/2006  DT: 04/05/2006  Job #: [id number]

## 2010-10-06 NOTE — Assessment & Plan Note (Signed)
[facility]                           GASTROENTEROLOGY OFFICE NOTE   NAME:Audrey [name], Audrey [name]                         MRN:          [id number]  DATE:01/14/2006                            DOB:          [birth year]    This nice lady comes in and says she has questionable ulcers in her throat.  Kind of a funny taste in her month and discomfort on her tongue and throat.  She says she had been having diarrhea for 3 weeks.  She was on Cipro for 5  days at 500 mg b.i.d. and then she continued it for another 5 days at 250 mg  b.i.d. for 5 days.  She says she has seen no blood in her stool, does have  some mucus.  Stools are somewhat like jelly.  Has some abdominal pain  working through into the back.  She has had no fever or chills.  She sees  Dr. Phillips Odor.  She says she does not remember anything that she could relate  to that might have caused this.  She says no other family members have had  any diarrheal episode.  The last colonoscopic examination was on August 08, 2004.  She did have several small polyps but nothing else of any  significance was seen except for some mild diverticulosis.  She has had some  problems in the past of dysphagia as well, and she has been on some proton  pump inhibitor for this.   PAST MEDICAL HISTORY:  1. Hyperlipidemia.  2. Arthritis.  3. Anxiety and depression.  4. She is status post hysterectomy.  5. She has had a tonsillectomy, cataract surgery and foot surgery.   FAMILY HISTORY:  Important in that she has a family member with colon cancer  and a family member with inflammatory bowel disease.   SOCIAL HISTORY:  Really noncontributory.  She is retired from the [employer].   REVIEW OF SYSTEMS:  Noncontributory.   PHYSICAL EXAMINATION:  VITAL SIGNS:  She weighed 188, blood pressure 108/66,  pulse 68 and regular.  GENERAL:  Mild obesity.  HEENT:  Oropharynx was coated and dry.  I could not detect any ulcerations  or lesions such as aphthous ulcers.  She was slightly pale.  NECK:  Negative.  Her thyroid was negative.  CARDIAC:  Palpation of the heart revealed a regular rhythm with no  significant murmur.  CHEST:  Clear.  ABDOMEN:  Soft, no mass or organomegaly, nontender.  RECTAL:  Deferred.   IMPRESSION:  1. Acute episode of diarrhea over the past 3 weeks, questionable etiology,      probably infectious or rule our inflammatory bowel disease.  2. Hyperlipidemia.  3. History of arthritis.  4. Anxiety and depression.  5. Status post hysterectomy, tonsillectomy, cataracts and foot surgery.  6. Gastroesophageal reflux disease.   RECOMMENDATIONS:  She should continue her Cipro but I am going to increase  it to 500 mg b.i.d. for 10 days, Flagyl 250 mg one q.i.d., get some stool  for O&P and culture prior to using the medication.  Continue  Lomotil one-  half for each loose bowel movement, no more than 5-6 a day.  I also gave her  some Magic Mouthwash to use for her throat and some Align, and I gave her  some samples of __________.   Present medications include some Prevacid, hydrochlorothiazide, Librium,  Lasix, Premarin, Clarinex, Zetia, Cymbalta, iron and vitamins, Coenzyme Q10,  Lomotil and Flonase.   I told her that if she was not any better in the next week to 10 days, she  should let us know or can call us if her condition worsens.  She needs to  drink lots and lots of fluids, and I put her on a low-residue diet.                                   Ulyess Mort, MD   SML/MedQ  DD:  01/14/2006  DT:  01/15/2006  Job #:  [id number]   cc:   Corrie Mckusick, MD

## 2010-10-06 NOTE — Assessment & Plan Note (Signed)
[facility]                           GASTROENTEROLOGY OFFICE NOTE   NAME:Audrey [name], Audrey [name]                         MRN:          [id number]  DATE:03/04/2006                            DOB:          1940/08/22    Audrey [name] comes in predominantly to discuss her symptoms.  She says that she is  better, approximately 50%.  She has had 4 bowel movements a day, no blood,  soft stools.  The patient has had some more gas with stools and slight  cramping with her bowel movements.  She said that the Cipro and Flagyl that  she took helped, but only near the end of the therapy; and her symptoms  returned fully after she stopped.  C. difficile studies negative, cultures  were negative.  The Prometheus came back as inflammatory bowel disease not  predicted.  She has gotten cushingoid from the prednisone.  She is on 20 mg  a day along with her Prevacid, Zetia, Cymbalta, multivitamins, Coenzyme Q10,  Premarin, Lasix, Librium, hydrochlorothiazide, and Flonase.   I suggested that she taper her prednisone over the next several weeks,  totally.  I gave her some Questran packets to take 1 packet a day, put her  on some Xifaxan 200 mg b.i.d. for 10 days.  We will see how she responds to  the above.  I told her just to call us after she has had approximately 10  days of treatment.            ______________________________  Ulyess Mort, MD      SML/MedQ  DD:  03/04/2006  DT:  03/05/2006  Job #:  [id number]

## 2010-10-06 NOTE — Procedures (Signed)
[facility]  Patient:    Audrey [name], Audrey [name]                           MRN: [id number] Proc. Date: 03/07/00 Attending:  Fayrene Fearing L. Randa Evens, M.D. CC:         [facility]             Dr. Vista Deck. Earlene Plater, M.D.             Brook A. Edward Jolly, M.D.                           Procedure Report  PROCEDURE:  Esophagogastroduodenoscopy with biopsy.  MEDICATIONS:  Hurricaine spray, fentanyl 62.5 mcg, Versed 7 mg IV.  INDICATION:  Persistent dyspepsia.  DESCRIPTION OF PROCEDURE:  The procedure had been explained to the patient and consent obtained.  With the patient in the left lateral decubitus position, the Olympus video endoscope was inserted blindly in the esophagus, advanced under direct visualization.  The stomach was entered, the pylorus identified and passed.  The duodenum, including the bulb and the second portion, was seen well.  Scope withdrawn.  The patient did have a small hiatal hernia.  No other lesions were seen.  Distal and proximal esophagus was normal.  The scope withdrawn.  The patient tolerated the procedure well.  ASSESSMENT:  Hiatal hernia with reflux.  PLAN:  Will recommend colonoscopy after evaluation for Von Willebrands disease. DD:  03/07/00 TD:  03/07/00 Job: [id number]

## 2011-01-08 ENCOUNTER — Ambulatory Visit (HOSPITAL_COMMUNITY)
Admission: RE | Admit: 2011-01-08 | Discharge: 2011-01-08 | Disposition: A | Discharge status: Home / Self Care | Payer: Medicare Other | Source: Ambulatory Visit | Attending: Family Medicine | Admitting: Family Medicine

## 2011-01-08 ENCOUNTER — Other Ambulatory Visit (HOSPITAL_COMMUNITY): Payer: Self-pay | Admitting: Family Medicine

## 2011-01-08 DIAGNOSIS — R609 Edema, unspecified: Secondary | ICD-10-CM

## 2011-01-08 DIAGNOSIS — M899 Disorder of bone, unspecified: Secondary | ICD-10-CM | POA: Insufficient documentation

## 2011-01-08 DIAGNOSIS — M542 Cervicalgia: Secondary | ICD-10-CM

## 2011-01-08 DIAGNOSIS — M7989 Other specified soft tissue disorders: Secondary | ICD-10-CM | POA: Insufficient documentation

## 2011-01-08 DIAGNOSIS — M949 Disorder of cartilage, unspecified: Secondary | ICD-10-CM | POA: Insufficient documentation

## 2011-01-10 ENCOUNTER — Other Ambulatory Visit (HOSPITAL_COMMUNITY): Payer: Self-pay | Admitting: Family Medicine

## 2011-01-10 DIAGNOSIS — M4802 Spinal stenosis, cervical region: Secondary | ICD-10-CM

## 2011-01-12 ENCOUNTER — Other Ambulatory Visit: Payer: Self-pay | Admitting: Gastroenterology

## 2011-01-12 MED ORDER — OMEPRAZOLE 40 MG PO CPDR: 40.0000 mg | DELAYED_RELEASE_CAPSULE | Freq: Every day | ORAL | Status: DC

## 2011-01-12 NOTE — Telephone Encounter (Signed)
Medication refilled

## 2011-01-15 ENCOUNTER — Ambulatory Visit (HOSPITAL_COMMUNITY)
Admission: RE | Admit: 2011-01-15 | Discharge: 2011-01-15 | Disposition: A | Discharge status: Home / Self Care | Payer: Medicare Other | Source: Ambulatory Visit | Attending: Family Medicine | Admitting: Family Medicine

## 2011-01-15 DIAGNOSIS — M4802 Spinal stenosis, cervical region: Secondary | ICD-10-CM

## 2011-01-15 DIAGNOSIS — M542 Cervicalgia: Secondary | ICD-10-CM | POA: Insufficient documentation

## 2011-03-01 HISTORY — PX: OTHER SURGICAL HISTORY: SHX169

## 2011-06-06 DIAGNOSIS — M26609 Unspecified temporomandibular joint disorder, unspecified side: Secondary | ICD-10-CM | POA: Diagnosis not present

## 2011-06-06 DIAGNOSIS — H9209 Otalgia, unspecified ear: Secondary | ICD-10-CM | POA: Diagnosis not present

## 2011-06-06 DIAGNOSIS — H905 Unspecified sensorineural hearing loss: Secondary | ICD-10-CM | POA: Diagnosis not present

## 2011-06-11 ENCOUNTER — Telehealth: Payer: Self-pay | Admitting: Internal Medicine

## 2011-06-11 NOTE — Telephone Encounter (Signed)
Patient called stating that she is still having problems with burning in her throat (acid reflux). Omeprazole not helping patient question what can she do. Please advise.

## 2011-06-12 NOTE — Telephone Encounter (Signed)
Left patient a message to return my call

## 2011-06-12 NOTE — Telephone Encounter (Signed)
Not seen since 2011  She could discuss with PCP or schedule non-urgent REV with me

## 2011-06-12 NOTE — Telephone Encounter (Signed)
Appointment scheduled for 06/26/11 @ 8:45.

## 2011-06-26 ENCOUNTER — Encounter: Payer: Self-pay | Admitting: Internal Medicine

## 2011-06-26 ENCOUNTER — Ambulatory Visit (INDEPENDENT_AMBULATORY_CARE_PROVIDER_SITE_OTHER): Payer: Medicare Other | Admitting: Internal Medicine

## 2011-06-26 VITALS — BP 118/70 | HR 76 | Ht 61.5 in | Wt 156.0 lb

## 2011-06-26 DIAGNOSIS — Z832 Family history of diseases of the blood and blood-forming organs and certain disorders involving the immune mechanism: Secondary | ICD-10-CM

## 2011-06-26 DIAGNOSIS — K219 Gastro-esophageal reflux disease without esophagitis: Secondary | ICD-10-CM | POA: Diagnosis not present

## 2011-06-26 DIAGNOSIS — R1011 Right upper quadrant pain: Secondary | ICD-10-CM

## 2011-06-26 DIAGNOSIS — Z1231 Encounter for screening mammogram for malignant neoplasm of breast: Secondary | ICD-10-CM | POA: Diagnosis not present

## 2011-06-26 DIAGNOSIS — R1319 Other dysphagia: Secondary | ICD-10-CM | POA: Diagnosis not present

## 2011-06-26 NOTE — Patient Instructions (Addendum)
Please try to reduce or eliminate caffeine. You have been scheduled for an Endoscopy with separate instructions given.

## 2011-06-26 NOTE — Assessment & Plan Note (Signed)
This seems mild. No problems on physical exam today. The ribs are not tender either. I would observe this. If we change PPI therapy may be effective though I think that it may the related to her fibromyalgia

## 2011-06-26 NOTE — Assessment & Plan Note (Addendum)
It sounds like this is flaring up it. May need to change PPI. Dysphagia have planned for upper GI endoscopy with probable repeat dilation. I have explained the risks benefits and indications to her she understands and agrees to proceed. She may need to reduce her caffeine as well. Have advised this. She has gained 10 kg in the last year and this could be having some effect on her symptoms. We discussed this today. She does not take him an increase in her abdominal girth. Weight loss is recommended anyway. Note that some of her symptoms sound more like regurgitation and therapy might be directed that.

## 2011-06-26 NOTE — Assessment & Plan Note (Signed)
She is having recurrent problems. This has responded dilation in the past. The last time was 56 [place]. I thought she might have eosinophilic esophagitis but she did not. Presumably related to GERD, question she's forming a stricture. Plan for upper endoscopy with esophageal dilation. Risks benefits and indications were explained she understands and agrees to proceed.

## 2011-06-26 NOTE — Progress Notes (Signed)
Subjective:    Patient ID: Audrey [name], female    DOB: [birth year], 71 y.o.   MRN: [id number]  HPI The patient is here due to several week history burning in the chest area and tightness in the neck with mild solid food, pill (KCL)dysphagia at times. Feels like spasms. Occurs intermitently and day or night though not disrupting sleep much but it has. Has intermittent burning taste in mouth. Right upper quadrant pain at times, sore. No pattern. No clear trigger. She notices she is belching in AM. She takes her omeprazole before breakfast 30-45 minutes before. About 2 cups of coffee a day (+ caffeine). Non-smoker, no tobacco. No bowel complaints. Wt Readings from Last 3 Encounters:  06/26/11 156 lb (70.761 kg)  11/29/09 134 lb (60.782 kg)   Allergies  Allergen Reactions  . Bactrim   . Erythromycin   . Statins    Outpatient Prescriptions Prior to Visit  Medication Sig Dispense Refill  . AMBULATORY NON FORMULARY MEDICATION Oxygen At bedtime      . chlordiazePOXIDE (LIBRIUM) 10 MG capsule Take 10 mg by mouth as needed.      . furosemide (LASIX) 40 MG tablet 1-2 tablets by mouth once daily      . gabapentin (NEURONTIN) 300 MG capsule Take 300 mg by mouth 2 (two) times daily.       . Milnacipran (SAVELLA) 50 MG TABS Take 50 mg by mouth 2 (two) times daily.      . Multiple Vitamin (MULTIVITAMIN) tablet Take 1 tablet by mouth daily.      Marland Kitchen omeprazole (PRILOSEC) 40 MG capsule Take 1 capsule (40 mg total) by mouth daily.  30 capsule  11  . potassium chloride SA (K-DUR,KLOR-CON) 20 MEQ tablet Take 2 tablets by mouth every morning and 2 tablets by mouth every evening      . hydrochlorothiazide (HYDRODIURIL) 25 MG tablet Take 25 mg by mouth daily.      . nebivolol (BYSTOLIC) 5 MG tablet Take 5 mg by mouth daily.      Marland Kitchen levothyroxine (SYNTHROID, LEVOTHROID) 25 MCG tablet Take 25 mcg by mouth daily.      . Vitamin D, Ergocalciferol, (DRISDOL) 50000 UNITS CAPS Take 50,000 Units by mouth every 7 (seven)  days.       Past Medical History  Diagnosis Date  . Allergic rhinitis   . Anxiety and depression   . Arthritis   . Obesity   . Colon polyp   . Diverticulosis   . HLD (hyperlipidemia)   . HTN (hypertension)   . Hypothyroidism   . Raynaud's syndrome   . Meniere's disease   . Von Willebrand disease     ? (testing did not show this)  . Gastric polyps   . GERD (gastroesophageal reflux disease)   . Fibromyalgia   . Sleep apnea    Past Surgical History  Procedure Date  . Abdominal hysterectomy 1982  . Tonsillectomy 1959  . Cataract extraction     bilateral  . Bladder repair 2005    ovaries, tubes removed; pelvic prolapse corrected; rectal repair  . Colonoscopy w/ biopsies and polypectomy 12/20/2009    diverticulosis, adenomatous polyps  . Upper gastrointestinal endoscopy 12/20/2009    GERD  . Foot surgery 1999    bilateral  . Basal cell carcinoma excision 03-31-01    face  . Lipoma excision     right arm  . Bladder surgery 01-2009    with mesh placement  . Laser surgery bladder  05-25-10    on mesh  . Nasolacrimal duct probing 09-12-10    right eye  . Nasolacrimal duct probing 09-26-10    left eye  . Removal of bladder mesh 03-01-11   History   Social History  . Marital Status: Married    Spouse Name: N/A    Number of Children: N/A  . Years of Education: N/A   Occupational History  . retired    Social History Main Topics  . Smoking status: Never Smoker   . Smokeless tobacco: Never Used  . Alcohol Use: No  . Drug Use: No  . Sexually Active: None   Other Topics Concern  . None   Social History Narrative  . None   Family History  Problem Relation Age of Onset  . Pancreatic cancer Mother   . Prostate cancer Father   . Colon polyps Father   . Clotting disorder Daughter   . Clotting disorder      grandchildren x 3  . Crohn's disease      granddaughter  . Diabetes Mother   . Diabetes Father   . Diabetes Brother   . Heart disease Father   . Kidney disease  Father   . Breast cancer Maternal Aunt     x 4  . Colon cancer Maternal Uncle   . Diabetes Maternal Grandmother   . Heart disease Paternal Grandfather   . Stroke Brother         Review of Systems No recent infections, + mild sinus drainage, + fibromyalgia symptoms Blood pressure medications changes after she had abdominal mesh - bladder infiltration surgery - has been on cephalexin since then   Objective:   Physical Exam General:  NAD Eyes:   anicteric Lungs:  clear Heart:  S1S2 no rubs, murmurs or gallops Abdomen:  soft and nontender, BS+ low midline scar     I have reviewed previous colonoscopy and endoscopy reports. Previous office visits.        Assessment & Plan:

## 2011-07-03 DIAGNOSIS — G4733 Obstructive sleep apnea (adult) (pediatric): Secondary | ICD-10-CM | POA: Diagnosis not present

## 2011-07-03 DIAGNOSIS — I10 Essential (primary) hypertension: Secondary | ICD-10-CM | POA: Diagnosis not present

## 2011-07-03 DIAGNOSIS — E782 Mixed hyperlipidemia: Secondary | ICD-10-CM | POA: Diagnosis not present

## 2011-07-03 DIAGNOSIS — R609 Edema, unspecified: Secondary | ICD-10-CM | POA: Diagnosis not present

## 2011-07-04 DIAGNOSIS — E782 Mixed hyperlipidemia: Secondary | ICD-10-CM | POA: Diagnosis not present

## 2011-07-04 DIAGNOSIS — E878 Other disorders of electrolyte and fluid balance, not elsewhere classified: Secondary | ICD-10-CM | POA: Diagnosis not present

## 2011-07-11 ENCOUNTER — Encounter: Payer: Self-pay | Admitting: Internal Medicine

## 2011-07-11 ENCOUNTER — Ambulatory Visit (AMBULATORY_SURGERY_CENTER): Payer: Medicare Other | Admitting: Internal Medicine

## 2011-07-11 VITALS — BP 138/77 | HR 78 | Temp 97.7°F | Resp 19 | Ht 61.5 in | Wt 156.0 lb

## 2011-07-11 DIAGNOSIS — R1319 Other dysphagia: Secondary | ICD-10-CM | POA: Diagnosis not present

## 2011-07-11 DIAGNOSIS — K219 Gastro-esophageal reflux disease without esophagitis: Secondary | ICD-10-CM | POA: Diagnosis not present

## 2011-07-11 DIAGNOSIS — R1011 Right upper quadrant pain: Secondary | ICD-10-CM

## 2011-07-11 MED ORDER — SODIUM CHLORIDE 0.9 % IV SOLN: 500.0000 mL | INTRAVENOUS | Status: DC

## 2011-07-11 MED ORDER — PANTOPRAZOLE SODIUM 40 MG PO TBEC: 40.0000 mg | DELAYED_RELEASE_TABLET | Freq: Every day | ORAL | Status: DC

## 2011-07-11 NOTE — Progress Notes (Signed)
@  1245 DISCHARGE Patient did not experience any of the following events: a burn prior to discharge; a fall within the facility; wrong site/side/patient/procedure/implant event; or a hospital transfer or hospital admission upon discharge from the facility. [phone]) Patient did not have preoperative order for IV antibiotic SSI prophylaxis. [phone])

## 2011-07-11 NOTE — Patient Instructions (Addendum)
A small hiatal hernia was seen. The esophagus was dilated. I changed omeprazole to pantoprazole - you may get that new prescription at your pharmacy. Iva Boop, MD, FACG  YOU HAD AN ENDOSCOPIC PROCEDURE TODAY AT THE [facility]: Refer to the procedure report that was given to you for any specific questions about what was found during the examination.  If the procedure report does not answer your questions, please call your gastroenterologist to clarify.  If you requested that your care partner not be given the details of your procedure findings, then the procedure report has been included in a sealed envelope for you to review at your convenience later.  YOU SHOULD EXPECT: Some feelings of bloating in the abdomen. Passage of more gas than usual.  Walking can help get rid of the air that was put into your GI tract during the procedure and reduce the bloating. If you had a lower endoscopy (such as a colonoscopy or flexible sigmoidoscopy) you may notice spotting of blood in your stool or on the toilet paper. If you underwent a bowel prep for your procedure, then you may not have a normal bowel movement for a few days.  Diet:   Follow the dilation diet given to you in recovery.  ACTIVITY: Your care partner should take you home directly after the procedure.  You should plan to take it easy, moving slowly for the rest of the day.  You can resume normal activity the day after the procedure however you should NOT DRIVE or use heavy machinery for 24 hours (because of the sedation medicines used during the test).    SYMPTOMS TO REPORT IMMEDIATELY: A gastroenterologist can be reached at any hour.  During normal business hours, 8:30 AM to 5:00 PM Monday through Friday, call [phone].  After hours and on weekends, please call the GI answering service at [phone] who will take a message and have the physician on call contact you.   Following upper endoscopy (EGD)  Vomiting of blood  or coffee ground material  New chest pain or pain under the shoulder blades  Painful or persistently difficult swallowing  New shortness of breath  Fever of 100F or higher  Black, tarry-looking stools  FOLLOW UP: If any biopsies were taken you will be contacted by phone or by letter within the next 1-3 weeks.  Call your gastroenterologist if you have not heard about the biopsies in 3 weeks.  Our staff will call the home number listed on your records the next business day following your procedure to check on you and address any questions or concerns that you may have at that time regarding the information given to you following your procedure. This is a courtesy call and so if there is no answer at the home number and we have not heard from you through the emergency physician on call, we will assume that you have returned to your regular daily activities without incident.  SIGNATURES/CONFIDENTIALITY: You and/or your care partner have signed paperwork which will be entered into your electronic medical record.  These signatures attest to the fact that that the information above on your After Visit Summary has been reviewed and is understood.  Full responsibility of the confidentiality of this discharge information lies with you and/or your care-partner.

## 2011-07-11 NOTE — Op Note (Addendum)
[facility] [address] [place], [place]  [address]  ENDOSCOPY PROCEDURE REPORT  PATIENT:  Audrey [name], Audrey [name]  MR#:  [id number] BIRTHDATE:  1940/11/04, 70 yrs. old  GENDER:  female  ENDOSCOPIST:  Iva Boop, MD, [facility]  PROCEDURE DATE:  07/11/2011 PROCEDURE:  EGD, diagnostic, [name] Dilation of the Esophagus ASA CLASS:  Class II INDICATIONS:  1) dysphagia  2) reflux symptoms despite therapy 3)RUQ pain  MEDICATIONS:   These medications were titrated to patient response per physician's verbal order, Fentanyl 50 mcg IV, Versed 6 mg and Benadryl 12.5 mg IV TOPICAL ANESTHETIC:  Cetacaine Spray  DESCRIPTION OF PROCEDURE:   After the risks benefits and alternatives of the procedure were thoroughly explained, informed consent was obtained.  The [id number] endoscope was introduced through the mouth and advanced to the second portion of the duodenum, without limitations.  The instrument was slowly withdrawn as the mucosa was carefully examined. Esophageal photo omitted. <<PROCEDUREIMAGES>>  A hiatal hernia was found. 2 cm in size, from 35-37.  polyps in the body of the stomach. 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 Several seen.  The examination was otherwise normal.    Dilation was then performed at the total esophagus  1) Dilator:  [name]  Size(s):  54 French Resistance:  minimal  Heme:  none  COMPLICATIONS:  None  ENDOSCOPIC IMPRESSION: 1) Hiatal hernia - 2 cm 2) Polyps in the body of the stomach - has known fundic gland polyps 3) Otherwise normal examination. RECOMMENDATIONS: 1) post-dilation diet today 2) stop omeprazole and start pantoprazole 40 mg daily 3) follow-up Dr. Leone Payor as needed  Iva Boop, MD, [facility]  CC:  Assunta Found, MD and The Patient  n. REVISED:  07/11/2011 12:10 PM eSIGNED:   Iva Boop at 07/11/2011 12:10 PM  Nehemiah Massed, [phone]

## 2011-07-12 ENCOUNTER — Telehealth: Payer: Self-pay | Admitting: *Deleted

## 2011-07-12 NOTE — Telephone Encounter (Signed)
  Follow up Call-  Call back number 07/11/2011  Post procedure Call Back phone  # [phone]  Permission to leave phone message Yes     Patient questions:  Do you have a fever, pain , or abdominal swelling? no Pain Score  0 *  Have you tolerated food without any problems? yes  Have you been able to return to your normal activities? yes  Do you have any questions about your discharge instructions: Diet   no Medications  no Follow up visit  no  Do you have questions or concerns about your Care? no  Actions: * If pain score is 4 or above: No action needed, pain <4.

## 2011-07-24 DIAGNOSIS — J019 Acute sinusitis, unspecified: Secondary | ICD-10-CM | POA: Diagnosis not present

## 2011-07-24 DIAGNOSIS — Z6827 Body mass index (BMI) 27.0-27.9, adult: Secondary | ICD-10-CM | POA: Diagnosis not present

## 2011-07-24 DIAGNOSIS — M19019 Primary osteoarthritis, unspecified shoulder: Secondary | ICD-10-CM | POA: Diagnosis not present

## 2011-07-24 DIAGNOSIS — E538 Deficiency of other specified B group vitamins: Secondary | ICD-10-CM | POA: Diagnosis not present

## 2011-07-24 DIAGNOSIS — J069 Acute upper respiratory infection, unspecified: Secondary | ICD-10-CM | POA: Diagnosis not present

## 2011-08-08 DIAGNOSIS — Z85828 Personal history of other malignant neoplasm of skin: Secondary | ICD-10-CM | POA: Diagnosis not present

## 2011-08-08 DIAGNOSIS — L570 Actinic keratosis: Secondary | ICD-10-CM | POA: Diagnosis not present

## 2011-08-08 DIAGNOSIS — L820 Inflamed seborrheic keratosis: Secondary | ICD-10-CM | POA: Diagnosis not present

## 2011-08-08 DIAGNOSIS — L821 Other seborrheic keratosis: Secondary | ICD-10-CM | POA: Diagnosis not present

## 2011-08-22 DIAGNOSIS — M79609 Pain in unspecified limb: Secondary | ICD-10-CM | POA: Diagnosis not present

## 2011-08-22 DIAGNOSIS — Q828 Other specified congenital malformations of skin: Secondary | ICD-10-CM | POA: Diagnosis not present

## 2011-09-07 ENCOUNTER — Other Ambulatory Visit (HOSPITAL_COMMUNITY): Payer: Self-pay | Admitting: Family Medicine

## 2011-09-07 DIAGNOSIS — E039 Hypothyroidism, unspecified: Secondary | ICD-10-CM | POA: Diagnosis not present

## 2011-09-07 DIAGNOSIS — Z139 Encounter for screening, unspecified: Secondary | ICD-10-CM

## 2011-09-07 DIAGNOSIS — J309 Allergic rhinitis, unspecified: Secondary | ICD-10-CM | POA: Diagnosis not present

## 2011-09-07 DIAGNOSIS — M25519 Pain in unspecified shoulder: Secondary | ICD-10-CM | POA: Diagnosis not present

## 2011-09-07 DIAGNOSIS — I10 Essential (primary) hypertension: Secondary | ICD-10-CM | POA: Diagnosis not present

## 2011-09-07 DIAGNOSIS — E538 Deficiency of other specified B group vitamins: Secondary | ICD-10-CM | POA: Diagnosis not present

## 2011-09-07 DIAGNOSIS — Z6826 Body mass index (BMI) 26.0-26.9, adult: Secondary | ICD-10-CM | POA: Diagnosis not present

## 2011-09-12 ENCOUNTER — Ambulatory Visit (HOSPITAL_COMMUNITY)
Admission: RE | Admit: 2011-09-12 | Discharge: 2011-09-12 | Disposition: A | Discharge status: Home / Self Care | Payer: Medicare Other | Source: Ambulatory Visit | Attending: Family Medicine | Admitting: Family Medicine

## 2011-09-12 DIAGNOSIS — Z13820 Encounter for screening for osteoporosis: Secondary | ICD-10-CM | POA: Diagnosis not present

## 2011-09-12 DIAGNOSIS — Z780 Asymptomatic menopausal state: Secondary | ICD-10-CM | POA: Diagnosis not present

## 2011-09-12 DIAGNOSIS — M19019 Primary osteoarthritis, unspecified shoulder: Secondary | ICD-10-CM | POA: Diagnosis not present

## 2011-09-12 DIAGNOSIS — M949 Disorder of cartilage, unspecified: Secondary | ICD-10-CM | POA: Insufficient documentation

## 2011-09-12 DIAGNOSIS — M25519 Pain in unspecified shoulder: Secondary | ICD-10-CM | POA: Diagnosis not present

## 2011-09-12 DIAGNOSIS — M899 Disorder of bone, unspecified: Secondary | ICD-10-CM | POA: Insufficient documentation

## 2011-09-12 DIAGNOSIS — Z139 Encounter for screening, unspecified: Secondary | ICD-10-CM

## 2011-09-26 DIAGNOSIS — G4733 Obstructive sleep apnea (adult) (pediatric): Secondary | ICD-10-CM | POA: Diagnosis not present

## 2011-09-26 DIAGNOSIS — I10 Essential (primary) hypertension: Secondary | ICD-10-CM | POA: Diagnosis not present

## 2011-09-26 DIAGNOSIS — E878 Other disorders of electrolyte and fluid balance, not elsewhere classified: Secondary | ICD-10-CM | POA: Diagnosis not present

## 2011-09-26 DIAGNOSIS — E782 Mixed hyperlipidemia: Secondary | ICD-10-CM | POA: Diagnosis not present

## 2011-10-11 DIAGNOSIS — D518 Other vitamin B12 deficiency anemias: Secondary | ICD-10-CM | POA: Diagnosis not present

## 2011-10-11 DIAGNOSIS — W57XXXA Bitten or stung by nonvenomous insect and other nonvenomous arthropods, initial encounter: Secondary | ICD-10-CM | POA: Diagnosis not present

## 2011-10-11 DIAGNOSIS — T148 Other injury of unspecified body region: Secondary | ICD-10-CM | POA: Diagnosis not present

## 2011-10-11 DIAGNOSIS — Z6826 Body mass index (BMI) 26.0-26.9, adult: Secondary | ICD-10-CM | POA: Diagnosis not present

## 2011-11-06 DIAGNOSIS — N3941 Urge incontinence: Secondary | ICD-10-CM | POA: Diagnosis not present

## 2011-11-06 DIAGNOSIS — R32 Unspecified urinary incontinence: Secondary | ICD-10-CM | POA: Diagnosis not present

## 2011-11-06 DIAGNOSIS — N390 Urinary tract infection, site not specified: Secondary | ICD-10-CM | POA: Diagnosis not present

## 2011-11-13 DIAGNOSIS — M79609 Pain in unspecified limb: Secondary | ICD-10-CM | POA: Diagnosis not present

## 2011-11-13 DIAGNOSIS — Q828 Other specified congenital malformations of skin: Secondary | ICD-10-CM | POA: Diagnosis not present

## 2011-11-14 DIAGNOSIS — G250 Essential tremor: Secondary | ICD-10-CM | POA: Diagnosis not present

## 2011-11-14 DIAGNOSIS — IMO0001 Reserved for inherently not codable concepts without codable children: Secondary | ICD-10-CM | POA: Diagnosis not present

## 2011-11-14 DIAGNOSIS — Z6826 Body mass index (BMI) 26.0-26.9, adult: Secondary | ICD-10-CM | POA: Diagnosis not present

## 2011-11-14 DIAGNOSIS — H9209 Otalgia, unspecified ear: Secondary | ICD-10-CM | POA: Diagnosis not present

## 2011-11-14 DIAGNOSIS — G252 Other specified forms of tremor: Secondary | ICD-10-CM | POA: Diagnosis not present

## 2011-12-25 DIAGNOSIS — N952 Postmenopausal atrophic vaginitis: Secondary | ICD-10-CM | POA: Diagnosis not present

## 2011-12-25 DIAGNOSIS — T83711A Erosion of implanted vaginal mesh and other prosthetic materials to surrounding organ or tissue, initial encounter: Secondary | ICD-10-CM | POA: Diagnosis not present

## 2011-12-25 DIAGNOSIS — R32 Unspecified urinary incontinence: Secondary | ICD-10-CM | POA: Diagnosis not present

## 2011-12-25 DIAGNOSIS — N3941 Urge incontinence: Secondary | ICD-10-CM | POA: Diagnosis not present

## 2011-12-25 DIAGNOSIS — R159 Full incontinence of feces: Secondary | ICD-10-CM | POA: Diagnosis not present

## 2011-12-27 DIAGNOSIS — R159 Full incontinence of feces: Secondary | ICD-10-CM | POA: Insufficient documentation

## 2011-12-27 DIAGNOSIS — D518 Other vitamin B12 deficiency anemias: Secondary | ICD-10-CM | POA: Diagnosis not present

## 2011-12-27 DIAGNOSIS — N952 Postmenopausal atrophic vaginitis: Secondary | ICD-10-CM | POA: Insufficient documentation

## 2011-12-27 DIAGNOSIS — N3941 Urge incontinence: Secondary | ICD-10-CM | POA: Insufficient documentation

## 2012-01-20 HISTORY — PX: INTERSTIM IMPLANT PLACEMENT: SHX5130

## 2012-01-29 DIAGNOSIS — IMO0001 Reserved for inherently not codable concepts without codable children: Secondary | ICD-10-CM | POA: Diagnosis not present

## 2012-01-29 DIAGNOSIS — N3946 Mixed incontinence: Secondary | ICD-10-CM | POA: Diagnosis not present

## 2012-01-29 DIAGNOSIS — Z9889 Other specified postprocedural states: Secondary | ICD-10-CM | POA: Insufficient documentation

## 2012-01-29 DIAGNOSIS — H8109 Meniere's disease, unspecified ear: Secondary | ICD-10-CM | POA: Insufficient documentation

## 2012-01-29 DIAGNOSIS — M797 Fibromyalgia: Secondary | ICD-10-CM | POA: Insufficient documentation

## 2012-01-29 DIAGNOSIS — T83711A Erosion of implanted vaginal mesh and other prosthetic materials to surrounding organ or tissue, initial encounter: Secondary | ICD-10-CM | POA: Diagnosis not present

## 2012-01-29 DIAGNOSIS — I10 Essential (primary) hypertension: Secondary | ICD-10-CM | POA: Diagnosis not present

## 2012-01-29 DIAGNOSIS — G4730 Sleep apnea, unspecified: Secondary | ICD-10-CM | POA: Diagnosis not present

## 2012-01-29 DIAGNOSIS — I7300 Raynaud's syndrome without gangrene: Secondary | ICD-10-CM | POA: Diagnosis not present

## 2012-01-29 DIAGNOSIS — K219 Gastro-esophageal reflux disease without esophagitis: Secondary | ICD-10-CM | POA: Diagnosis not present

## 2012-02-05 DIAGNOSIS — J069 Acute upper respiratory infection, unspecified: Secondary | ICD-10-CM | POA: Diagnosis not present

## 2012-02-05 DIAGNOSIS — F411 Generalized anxiety disorder: Secondary | ICD-10-CM | POA: Diagnosis not present

## 2012-02-05 DIAGNOSIS — M76899 Other specified enthesopathies of unspecified lower limb, excluding foot: Secondary | ICD-10-CM | POA: Diagnosis not present

## 2012-02-08 DIAGNOSIS — R159 Full incontinence of feces: Secondary | ICD-10-CM | POA: Diagnosis not present

## 2012-02-08 DIAGNOSIS — K219 Gastro-esophageal reflux disease without esophagitis: Secondary | ICD-10-CM | POA: Diagnosis not present

## 2012-02-08 DIAGNOSIS — Z87440 Personal history of urinary (tract) infections: Secondary | ICD-10-CM | POA: Diagnosis not present

## 2012-02-08 DIAGNOSIS — G4730 Sleep apnea, unspecified: Secondary | ICD-10-CM | POA: Diagnosis not present

## 2012-02-08 DIAGNOSIS — N318 Other neuromuscular dysfunction of bladder: Secondary | ICD-10-CM | POA: Diagnosis not present

## 2012-02-08 DIAGNOSIS — I10 Essential (primary) hypertension: Secondary | ICD-10-CM | POA: Diagnosis not present

## 2012-02-08 DIAGNOSIS — R32 Unspecified urinary incontinence: Secondary | ICD-10-CM | POA: Diagnosis not present

## 2012-02-08 DIAGNOSIS — Z5181 Encounter for therapeutic drug level monitoring: Secondary | ICD-10-CM | POA: Diagnosis not present

## 2012-02-08 DIAGNOSIS — Z79899 Other long term (current) drug therapy: Secondary | ICD-10-CM | POA: Diagnosis not present

## 2012-02-08 DIAGNOSIS — N3946 Mixed incontinence: Secondary | ICD-10-CM | POA: Diagnosis not present

## 2012-02-18 DIAGNOSIS — R32 Unspecified urinary incontinence: Secondary | ICD-10-CM | POA: Diagnosis not present

## 2012-02-26 DIAGNOSIS — R32 Unspecified urinary incontinence: Secondary | ICD-10-CM | POA: Diagnosis not present

## 2012-02-26 DIAGNOSIS — N390 Urinary tract infection, site not specified: Secondary | ICD-10-CM | POA: Diagnosis not present

## 2012-03-03 DIAGNOSIS — D485 Neoplasm of uncertain behavior of skin: Secondary | ICD-10-CM | POA: Diagnosis not present

## 2012-03-03 DIAGNOSIS — Z85828 Personal history of other malignant neoplasm of skin: Secondary | ICD-10-CM | POA: Diagnosis not present

## 2012-03-03 DIAGNOSIS — L820 Inflamed seborrheic keratosis: Secondary | ICD-10-CM | POA: Diagnosis not present

## 2012-03-03 DIAGNOSIS — C44320 Squamous cell carcinoma of skin of unspecified parts of face: Secondary | ICD-10-CM | POA: Diagnosis not present

## 2012-03-18 DIAGNOSIS — R300 Dysuria: Secondary | ICD-10-CM | POA: Diagnosis not present

## 2012-03-18 DIAGNOSIS — N318 Other neuromuscular dysfunction of bladder: Secondary | ICD-10-CM | POA: Diagnosis not present

## 2012-03-18 DIAGNOSIS — R159 Full incontinence of feces: Secondary | ICD-10-CM | POA: Diagnosis not present

## 2012-03-21 DIAGNOSIS — N3281 Overactive bladder: Secondary | ICD-10-CM | POA: Insufficient documentation

## 2012-04-02 DIAGNOSIS — L821 Other seborrheic keratosis: Secondary | ICD-10-CM | POA: Diagnosis not present

## 2012-04-02 DIAGNOSIS — L570 Actinic keratosis: Secondary | ICD-10-CM | POA: Diagnosis not present

## 2012-04-02 DIAGNOSIS — L905 Scar conditions and fibrosis of skin: Secondary | ICD-10-CM | POA: Diagnosis not present

## 2012-04-02 DIAGNOSIS — C44320 Squamous cell carcinoma of skin of unspecified parts of face: Secondary | ICD-10-CM | POA: Diagnosis not present

## 2012-04-03 ENCOUNTER — Other Ambulatory Visit (HOSPITAL_COMMUNITY): Payer: Self-pay | Admitting: Family Medicine

## 2012-04-03 ENCOUNTER — Ambulatory Visit (HOSPITAL_COMMUNITY)
Admission: RE | Admit: 2012-04-03 | Discharge: 2012-04-03 | Disposition: A | Discharge status: Home / Self Care | Payer: Medicare Other | Source: Ambulatory Visit | Attending: Family Medicine | Admitting: Family Medicine

## 2012-04-03 DIAGNOSIS — M25559 Pain in unspecified hip: Secondary | ICD-10-CM | POA: Diagnosis not present

## 2012-04-03 DIAGNOSIS — M169 Osteoarthritis of hip, unspecified: Secondary | ICD-10-CM | POA: Diagnosis not present

## 2012-04-03 DIAGNOSIS — E538 Deficiency of other specified B group vitamins: Secondary | ICD-10-CM | POA: Diagnosis not present

## 2012-04-03 DIAGNOSIS — Z23 Encounter for immunization: Secondary | ICD-10-CM | POA: Diagnosis not present

## 2012-04-04 DIAGNOSIS — I10 Essential (primary) hypertension: Secondary | ICD-10-CM | POA: Diagnosis not present

## 2012-04-04 DIAGNOSIS — G4733 Obstructive sleep apnea (adult) (pediatric): Secondary | ICD-10-CM | POA: Diagnosis not present

## 2012-04-04 DIAGNOSIS — E782 Mixed hyperlipidemia: Secondary | ICD-10-CM | POA: Diagnosis not present

## 2012-05-23 DIAGNOSIS — H04129 Dry eye syndrome of unspecified lacrimal gland: Secondary | ICD-10-CM | POA: Diagnosis not present

## 2012-05-23 DIAGNOSIS — Z961 Presence of intraocular lens: Secondary | ICD-10-CM | POA: Diagnosis not present

## 2012-05-23 DIAGNOSIS — H1045 Other chronic allergic conjunctivitis: Secondary | ICD-10-CM | POA: Diagnosis not present

## 2012-05-23 DIAGNOSIS — H40029 Open angle with borderline findings, high risk, unspecified eye: Secondary | ICD-10-CM | POA: Diagnosis not present

## 2012-05-29 DIAGNOSIS — M159 Polyosteoarthritis, unspecified: Secondary | ICD-10-CM | POA: Diagnosis not present

## 2012-05-29 DIAGNOSIS — M76899 Other specified enthesopathies of unspecified lower limb, excluding foot: Secondary | ICD-10-CM | POA: Diagnosis not present

## 2012-05-29 DIAGNOSIS — J018 Other acute sinusitis: Secondary | ICD-10-CM | POA: Diagnosis not present

## 2012-06-03 ENCOUNTER — Encounter: Payer: Self-pay | Admitting: Internal Medicine

## 2012-06-03 ENCOUNTER — Ambulatory Visit (INDEPENDENT_AMBULATORY_CARE_PROVIDER_SITE_OTHER): Payer: Medicare Other | Admitting: Internal Medicine

## 2012-06-03 VITALS — BP 128/80 | HR 80 | Ht 61.5 in | Wt 149.2 lb

## 2012-06-03 DIAGNOSIS — K219 Gastro-esophageal reflux disease without esophagitis: Secondary | ICD-10-CM | POA: Diagnosis not present

## 2012-06-03 DIAGNOSIS — R1310 Dysphagia, unspecified: Secondary | ICD-10-CM

## 2012-06-03 NOTE — Progress Notes (Addendum)
Subjective:    Patient ID: Audrey [name], female    DOB: 06-21-1940, 72 y.o.   MRN: [id number]  HPI The patient presents in followup for evaluation of increasing heartburn and reflux symptoms and some dysphagia. I had previously seen her last year, in February 2013 and she underwent an upper endoscopy and dilation, 40 [place]. Says that helped quite a bit. Over the past couple of months, she has had increasing problems with heartburn or reflux and even chest pain-like symptoms. She held her Fosamax which she takes weekly for about 3 weeks at the direction of her primary care physician, and says that did not make any difference. If she bends over she will regurgitate at times. There is still intermittent solid dysphagia but not as frequently as was prior to the dilation in February 2013. She did have multiple months where she was free of dysphagia. She has lost weight, intentionally, she remains active. There is rare caffeine use and she does not smoke. Allergies  Allergen Reactions  . Adhesive (Tape)   . Bactrim   . Erythromycin   . Statins    Outpatient Prescriptions Prior to Visit  Medication Sig Dispense Refill  . AMBULATORY NON FORMULARY MEDICATION Oxygen At bedtime      . chlordiazePOXIDE (LIBRIUM) 10 MG capsule Take 10 mg by mouth as needed.      . Cholecalciferol (VITAMIN D3) 2000 UNITS TABS Take 2 tablets by mouth daily.      . Coenzyme Q10 200 MG capsule Take 200 mg by mouth daily.      . furosemide (LASIX) 40 MG tablet 1-2 tablets by mouth once daily      . gabapentin (NEURONTIN) 300 MG capsule Take 300 mg by mouth 2 (two) times daily.       Marland Kitchen KRILL OIL PO Take 1 capsule by mouth daily.      Marland Kitchen levothyroxine (SYNTHROID, LEVOTHROID) 50 MCG tablet Take 50 mcg by mouth daily.      . Milnacipran (SAVELLA) 50 MG TABS Take 50 mg by mouth 2 (two) times daily.      . Multiple Vitamin (MULTIVITAMIN) tablet Take 1 tablet by mouth daily.      . pantoprazole (PROTONIX) 40 MG tablet Take 1 tablet  (40 mg total) by mouth daily. Take 30 minutes before breakfast  30 tablet  11  . potassium chloride SA (K-DUR,KLOR-CON) 20 MEQ tablet Take 2 tablets by mouth every morning and 2 tablets by mouth every evening                            Last reviewed on 06/03/2012 11:43 AM by Iva Boop, MD Past Medical History  Diagnosis Date  . Allergic rhinitis   . Anxiety and depression   . Arthritis   . Obesity   . Colon polyp   . Diverticulosis   . HLD (hyperlipidemia)   . HTN (hypertension)   . Hypothyroidism   . Raynaud's syndrome   . Meniere's disease   . Gastric polyps   . GERD (gastroesophageal reflux disease)   . Fibromyalgia   . Sleep apnea    Past Surgical History  Procedure Date  . Abdominal hysterectomy 1982  . Tonsillectomy 1959  . Cataract extraction     bilateral  . Bladder repair 2005    ovaries, tubes removed; pelvic prolapse corrected; rectal repair  . Colonoscopy w/ biopsies and polypectomy 12/20/2009    diverticulosis, adenomatous polyps  .  Upper gastrointestinal endoscopy 12/20/2009    GERD  . Foot surgery 1999    bilateral  . Basal cell carcinoma excision 03-31-01    face  . Lipoma excision     right arm  . Bladder surgery 01-2009    with mesh placement  . Laser surgery bladder 05-25-10    on mesh  . Nasolacrimal duct probing 09-12-10    right eye  . Nasolacrimal duct probing 09-26-10    left eye  . Removal of bladder mesh 03-01-11    [facility]  . Interstim implant placement 01/2012    [facility]   History   Social History  . Marital Status: Married                 Occupational History  . retired    Social History Main Topics  . Smoking status: Never Smoker   . Smokeless tobacco: Never Used  . Alcohol Use: No  . Drug Use: No  .            Social History Narrative   Married1 son and 2 daughters, 10 grandchildrenRemains active   Family History  Problem Relation Age of Onset  . Pancreatic cancer Mother   . Prostate cancer Father   . Colon polyps  Father   . Clotting disorder Daughter   . Clotting disorder      grandchildren x 3  . Crohn's disease      granddaughter  . Diabetes Mother   . Diabetes Father   . Diabetes Brother   . Heart disease Father   . Kidney disease Father   . Breast cancer Maternal Aunt     x 4  . Colon cancer Maternal Uncle   . Diabetes Maternal Grandmother   . Heart disease Paternal Grandfather   . Stroke Brother     Review of Systems She is doing well with her interstitial and the device to prevent bladder urgency. Fibromyalgia is well controlled on current therapy He is having right hip and back pain, it did not respond to injection, she is waiting to see a neurologist    Objective:   Physical Exam General:  NAD Eyes:   anicteric Lungs:  clear Heart:  S1S2 no rubs, murmurs or gallops Abdomen:  soft and nontender, BS+ Ext:   no edema  Data Reviewed:  2012 EGD Prior barium study 2005, barium esophagram which was without significant abnormality        Assessment & Plan:   1. Dysphagia   2. GERD (gastroesophageal reflux disease)    1. With a recurrent symptoms and the fact that she is on Fosamax I recommended an upper endoscopy. I think it would be worthwhile for her to have inspection of the esophagus while she is on Fosamax. Though she did not improve when she held it for 3 weeks, that doesn't exclude this as a cause of her problems. 2. Will plan for likely esophageal dilation at that time as well. 3. Endoscopy risks benefits and alternatives are explained to the patient. Since she uses oxygen at home at night for her sleep apnea, we'll perform this procedure in the safer environment at the hospital

## 2012-06-03 NOTE — Patient Instructions (Addendum)
You have been scheduled for an endoscopy with dilation at [facility]. Please follow written instructions given to you at your visit today. If you use inhalers (even only as needed) or a CPAP machine, please bring them with you on the day of your procedure.  Thank you for choosing me and [facility].  Iva Boop, M.D., [facility]

## 2012-06-04 ENCOUNTER — Other Ambulatory Visit (HOSPITAL_COMMUNITY): Payer: Self-pay | Admitting: Physical Medicine and Rehabilitation

## 2012-06-04 ENCOUNTER — Ambulatory Visit (HOSPITAL_COMMUNITY)
Admission: RE | Admit: 2012-06-04 | Discharge: 2012-06-04 | Disposition: A | Discharge status: Home / Self Care | Payer: Medicare Other | Source: Ambulatory Visit | Attending: Physical Medicine and Rehabilitation | Admitting: Physical Medicine and Rehabilitation

## 2012-06-04 DIAGNOSIS — M47817 Spondylosis without myelopathy or radiculopathy, lumbosacral region: Secondary | ICD-10-CM | POA: Insufficient documentation

## 2012-06-04 DIAGNOSIS — M4310 Spondylolisthesis, site unspecified: Secondary | ICD-10-CM | POA: Diagnosis not present

## 2012-06-04 DIAGNOSIS — M545 Low back pain, unspecified: Secondary | ICD-10-CM | POA: Insufficient documentation

## 2012-06-04 DIAGNOSIS — M79609 Pain in unspecified limb: Secondary | ICD-10-CM | POA: Diagnosis not present

## 2012-06-04 DIAGNOSIS — M4120 Other idiopathic scoliosis, site unspecified: Secondary | ICD-10-CM | POA: Diagnosis not present

## 2012-06-04 DIAGNOSIS — M25559 Pain in unspecified hip: Secondary | ICD-10-CM | POA: Insufficient documentation

## 2012-06-04 DIAGNOSIS — Q828 Other specified congenital malformations of skin: Secondary | ICD-10-CM | POA: Diagnosis not present

## 2012-06-09 ENCOUNTER — Ambulatory Visit (HOSPITAL_COMMUNITY)
Admission: RE | Admit: 2012-06-09 | Discharge: 2012-06-09 | Disposition: A | Discharge status: Home / Self Care | Payer: Medicare Other | Source: Ambulatory Visit | Attending: Internal Medicine | Admitting: Internal Medicine

## 2012-06-09 ENCOUNTER — Encounter (HOSPITAL_COMMUNITY)
Admission: RE | Disposition: A | Discharge status: Home / Self Care | Payer: Self-pay | Source: Ambulatory Visit | Attending: Internal Medicine

## 2012-06-09 ENCOUNTER — Encounter (HOSPITAL_COMMUNITY): Payer: Self-pay

## 2012-06-09 DIAGNOSIS — K2289 Other specified disease of esophagus: Secondary | ICD-10-CM | POA: Insufficient documentation

## 2012-06-09 DIAGNOSIS — Z79899 Other long term (current) drug therapy: Secondary | ICD-10-CM | POA: Diagnosis not present

## 2012-06-09 DIAGNOSIS — G4730 Sleep apnea, unspecified: Secondary | ICD-10-CM | POA: Insufficient documentation

## 2012-06-09 DIAGNOSIS — D131 Benign neoplasm of stomach: Secondary | ICD-10-CM | POA: Diagnosis not present

## 2012-06-09 DIAGNOSIS — K228 Other specified diseases of esophagus: Secondary | ICD-10-CM | POA: Insufficient documentation

## 2012-06-09 DIAGNOSIS — K449 Diaphragmatic hernia without obstruction or gangrene: Secondary | ICD-10-CM | POA: Insufficient documentation

## 2012-06-09 DIAGNOSIS — R1013 Epigastric pain: Secondary | ICD-10-CM | POA: Insufficient documentation

## 2012-06-09 DIAGNOSIS — E039 Hypothyroidism, unspecified: Secondary | ICD-10-CM | POA: Diagnosis not present

## 2012-06-09 DIAGNOSIS — R1310 Dysphagia, unspecified: Secondary | ICD-10-CM | POA: Insufficient documentation

## 2012-06-09 DIAGNOSIS — K219 Gastro-esophageal reflux disease without esophagitis: Secondary | ICD-10-CM | POA: Diagnosis not present

## 2012-06-09 DIAGNOSIS — K3189 Other diseases of stomach and duodenum: Secondary | ICD-10-CM | POA: Insufficient documentation

## 2012-06-09 DIAGNOSIS — I10 Essential (primary) hypertension: Secondary | ICD-10-CM | POA: Insufficient documentation

## 2012-06-09 DIAGNOSIS — R1011 Right upper quadrant pain: Secondary | ICD-10-CM

## 2012-06-09 HISTORY — PX: ESOPHAGOGASTRODUODENOSCOPY (EGD) WITH ESOPHAGEAL DILATION: SHX5812

## 2012-06-09 SURGERY — ESOPHAGOGASTRODUODENOSCOPY (EGD) WITH ESOPHAGEAL DILATION
Anesthesia: Moderate Sedation

## 2012-06-09 MED ORDER — BUTAMBEN-TETRACAINE-BENZOCAINE 2-2-14 % EX AERO
INHALATION_SPRAY | CUTANEOUS | Status: DC | PRN
  Administered 2012-06-09: 2 via TOPICAL

## 2012-06-09 MED ORDER — SODIUM CHLORIDE 0.9 % IV SOLN
INTRAVENOUS | Status: DC
  Administered 2012-06-09: 500 mL via INTRAVENOUS

## 2012-06-09 MED ORDER — MIDAZOLAM HCL 10 MG/2ML IJ SOLN
INTRAMUSCULAR | Status: AC
  Filled 2012-06-09: qty 2

## 2012-06-09 MED ORDER — FENTANYL CITRATE 0.05 MG/ML IJ SOLN
INTRAMUSCULAR | Status: DC | PRN
  Administered 2012-06-09 (×3): 25 ug via INTRAVENOUS

## 2012-06-09 MED ORDER — FENTANYL CITRATE 0.05 MG/ML IJ SOLN
INTRAMUSCULAR | Status: AC
  Filled 2012-06-09: qty 2

## 2012-06-09 MED ORDER — MIDAZOLAM HCL 10 MG/2ML IJ SOLN
INTRAMUSCULAR | Status: DC | PRN
  Administered 2012-06-09 (×4): 2 mg via INTRAVENOUS

## 2012-06-09 NOTE — H&P (View-Only) (Signed)
Subjective:    Patient ID: Audrey [name], female    DOB: 06/01/1940, 72 y.o.   MRN: [id number]  HPI The patient presents in followup for evaluation of increasing heartburn and reflux symptoms and some dysphagia. I had previously seen her last year, in February 2013 and she underwent an upper endoscopy and dilation, 48 [place]. Says that helped quite a bit. Over the past couple of months, she has had increasing problems with heartburn or reflux and even chest pain-like symptoms. She held her Fosamax which she takes weekly for about 3 weeks at the direction of her primary care physician, and says that did not make any difference. If she bends over she will regurgitate at times. There is still intermittent solid dysphagia but not as frequently as was prior to the dilation in February 2013. She did have multiple months where she was free of dysphagia. She has lost weight, intentionally, she remains active. There is rare caffeine use and she does not smoke. Allergies  Allergen Reactions  . Adhesive (Tape)   . Bactrim   . Erythromycin   . Statins    Outpatient Prescriptions Prior to Visit  Medication Sig Dispense Refill  . AMBULATORY NON FORMULARY MEDICATION Oxygen At bedtime      . chlordiazePOXIDE (LIBRIUM) 10 MG capsule Take 10 mg by mouth as needed.      . Cholecalciferol (VITAMIN D3) 2000 UNITS TABS Take 2 tablets by mouth daily.      . Coenzyme Q10 200 MG capsule Take 200 mg by mouth daily.      . furosemide (LASIX) 40 MG tablet 1-2 tablets by mouth once daily      . gabapentin (NEURONTIN) 300 MG capsule Take 300 mg by mouth 2 (two) times daily.       Marland Kitchen KRILL OIL PO Take 1 capsule by mouth daily.      Marland Kitchen levothyroxine (SYNTHROID, LEVOTHROID) 50 MCG tablet Take 50 mcg by mouth daily.      . Milnacipran (SAVELLA) 50 MG TABS Take 50 mg by mouth 2 (two) times daily.      . Multiple Vitamin (MULTIVITAMIN) tablet Take 1 tablet by mouth daily.      . pantoprazole (PROTONIX) 40 MG tablet Take 1 tablet  (40 mg total) by mouth daily. Take 30 minutes before breakfast  30 tablet  11  . potassium chloride SA (K-DUR,KLOR-CON) 20 MEQ tablet Take 2 tablets by mouth every morning and 2 tablets by mouth every evening                            Last reviewed on 06/03/2012 11:43 AM by Iva Boop, MD Past Medical History  Diagnosis Date  . Allergic rhinitis   . Anxiety and depression   . Arthritis   . Obesity   . Colon polyp   . Diverticulosis   . HLD (hyperlipidemia)   . HTN (hypertension)   . Hypothyroidism   . Raynaud's syndrome   . Meniere's disease   . Gastric polyps   . GERD (gastroesophageal reflux disease)   . Fibromyalgia   . Sleep apnea    Past Surgical History  Procedure Date  . Abdominal hysterectomy 1982  . Tonsillectomy 1959  . Cataract extraction     bilateral  . Bladder repair 2005    ovaries, tubes removed; pelvic prolapse corrected; rectal repair  . Colonoscopy w/ biopsies and polypectomy 12/20/2009    diverticulosis, adenomatous polyps  .  Upper gastrointestinal endoscopy 12/20/2009    GERD  . Foot surgery 1999    bilateral  . Basal cell carcinoma excision 03-31-01    face  . Lipoma excision     right arm  . Bladder surgery 01-2009    with mesh placement  . Laser surgery bladder 05-25-10    on mesh  . Nasolacrimal duct probing 09-12-10    right eye  . Nasolacrimal duct probing 09-26-10    left eye  . Removal of bladder mesh 03-01-11    [facility]  . Interstim implant placement 01/2012    [facility]   History   Social History  . Marital Status: Married                 Occupational History  . retired    Social History Main Topics  . Smoking status: Never Smoker   . Smokeless tobacco: Never Used  . Alcohol Use: No  . Drug Use: No  .            Social History Narrative   Married1 son and 2 daughters, 10 grandchildrenRemains active   Family History  Problem Relation Age of Onset  . Pancreatic cancer Mother   . Prostate cancer Father   . Colon polyps  Father   . Clotting disorder Daughter   . Clotting disorder      grandchildren x 3  . Crohn's disease      granddaughter  . Diabetes Mother   . Diabetes Father   . Diabetes Brother   . Heart disease Father   . Kidney disease Father   . Breast cancer Maternal Aunt     x 4  . Colon cancer Maternal Uncle   . Diabetes Maternal Grandmother   . Heart disease Paternal Grandfather   . Stroke Brother     Review of Systems She is doing well with her interstitial and the device to prevent bladder urgency. Fibromyalgia is well controlled on current therapy He is having right hip and back pain, it did not respond to injection, she is waiting to see a neurologist    Objective:   Physical Exam General:  NAD Eyes:   anicteric Lungs:  clear Heart:  S1S2 no rubs, murmurs or gallops Abdomen:  soft and nontender, BS+ Ext:   no edema  Data Reviewed:  2012 EGD Prior barium study 2005, barium esophagram which was without significant abnormality        Assessment & Plan:   1. Dysphagia   2. GERD (gastroesophageal reflux disease)    1. With a recurrent symptoms and the fact that she is on Fosamax I recommended an upper endoscopy. I think it would be worthwhile for her to have inspection of the esophagus while she is on Fosamax. Though she did not improve when she held it for 3 weeks, that doesn't exclude this as a cause of her problems. 2. Will plan for likely esophageal dilation at that time as well. 3. Anoscopy risks benefits and alternatives are explained to the patient. Since she uses oxygen at home at night for her sleep apnea, we'll perform this procedure in the safer environment at the hospital

## 2012-06-09 NOTE — Op Note (Signed)
[facility] [address] [place], [address]   ENDOSCOPY PROCEDURE REPORT  PATIENT: Audrey [name], Audrey [name]  MR#: [id number] BIRTHDATE: [birth year] , 71  yrs. old GENDER: Female ENDOSCOPIST: Iva Boop, MD, [facility] PROCEDURE DATE:  06/09/2012 PROCEDURE:  EGD, diagnostic and Maloney dilation of esophagus ASA CLASS:     Class II INDICATIONS:  Dysphagia.   abdominal pain in the upper right quadrant.   Dyspepsia. MEDICATIONS: Fentanyl 75 mcg IV and Versed 8 mg IV TOPICAL ANESTHETIC: Cetacaine Spray  DESCRIPTION OF PROCEDURE: After the risks benefits and alternatives of the procedure were thoroughly explained, informed consent was obtained.  The Pentax Gastroscope [id number] endoscope was introduced through the mouth and advanced to the second portion of the duodenum. Without limitations.  The instrument was slowly withdrawn as the mucosa was fully examined.        ESOPHAGUS: The esophagus was tortuous.   A 1 cm hiatal hernia was noted.   Irregular z-line negative for Barrett's in past  STOMACH: Multiple diminutive sessile polyps were found in the gastric body and gastric antrum had a prominent fold/polyp. All as before on prior exams/already biopsied.  The remainder of the upper endoscopy exam was otherwise normal. Retroflexed views revealed a hiatal hernia.     The scope was then withdrawn from the patient and the procedure completed.  COMPLICATIONS: There were no complications. ENDOSCOPIC IMPRESSION: 1.   The esophagus was tortuous with some possible distal stenosis (GE junction)-dilated 54 french 2.   1 cm hiatal hernia 3.   Multiple diminutive sessile polyps were found in the gastric body (known fundic gland polyps) and a chronic prominentantral fold/polyp. Both have been biopsied before. 4.   The remainder of the upper endoscopy exam was otherwise normal Fosamax not causing problems as far as I can tell.  RECOMMENDATIONS: Clear liquids until 1130  , then  soft foods rest of day.  Resume prior diet tomorrow. I will call with plans, ? try amitriptyline. Would be ok to try ketorolac. eSigned:  Iva Boop, MD, [facility] 06/09/2012 10:11 AM  XB:JYNW Phillips Odor, MD, Herminio Heads, MDand The Patient

## 2012-06-09 NOTE — Interval H&P Note (Signed)
History and Physical Interval Note:  06/09/2012 9:24 AM  Audrey [name]  has presented today for surgery, with the diagnosis of gerd/dysphagia  The various methods of treatment have been discussed with the patient and family. After consideration of risks, benefits and other options for treatment, the patient has consented to  Procedure(s) (LRB) with comments: ESOPHAGOGASTRODUODENOSCOPY (EGD) WITH ESOPHAGEAL DILATION (N/A) as a surgical intervention .  The patient's history has been reviewed, patient examined, no change in status, stable for surgery.  I have reviewed the patient's chart and labs.  Questions were answered to the patient's satisfaction.     Stan Head, MD, [facility]

## 2012-06-09 NOTE — Progress Notes (Signed)
Patient denies versed allergy,discussed with Dr.  Leone Payor ok to proceed with sedation.

## 2012-06-10 ENCOUNTER — Encounter (HOSPITAL_COMMUNITY): Payer: Self-pay | Admitting: Internal Medicine

## 2012-06-17 DIAGNOSIS — M79609 Pain in unspecified limb: Secondary | ICD-10-CM | POA: Diagnosis not present

## 2012-06-17 DIAGNOSIS — M47817 Spondylosis without myelopathy or radiculopathy, lumbosacral region: Secondary | ICD-10-CM | POA: Diagnosis not present

## 2012-06-17 DIAGNOSIS — G894 Chronic pain syndrome: Secondary | ICD-10-CM | POA: Diagnosis not present

## 2012-06-17 DIAGNOSIS — M545 Low back pain: Secondary | ICD-10-CM | POA: Diagnosis not present

## 2012-06-27 DIAGNOSIS — Z803 Family history of malignant neoplasm of breast: Secondary | ICD-10-CM | POA: Diagnosis not present

## 2012-06-27 DIAGNOSIS — Z1231 Encounter for screening mammogram for malignant neoplasm of breast: Secondary | ICD-10-CM | POA: Diagnosis not present

## 2012-07-01 ENCOUNTER — Other Ambulatory Visit: Payer: Self-pay | Admitting: Internal Medicine

## 2012-07-05 ENCOUNTER — Other Ambulatory Visit: Payer: Self-pay

## 2012-07-08 ENCOUNTER — Telehealth: Payer: Self-pay

## 2012-07-08 NOTE — Telephone Encounter (Signed)
Message copied by Annett Fabian on Tue Jul 08, 2012  9:41 AM ------      Message from: Stan Head E      Created: Mon Jul 07, 2012  2:48 PM      Regarding: ok to try amitriptyline       Please let her know that I eventually caught up with Dr. Eduard Clos (Rehab and pain MD)and when she sees her in follow-up plan is to consider using amitrityline to see if that helps her sxs.       ------

## 2012-07-08 NOTE — Telephone Encounter (Signed)
Patient advised  She asked that I send a copy if this to her primary care

## 2012-07-14 DIAGNOSIS — M169 Osteoarthritis of hip, unspecified: Secondary | ICD-10-CM | POA: Diagnosis not present

## 2012-08-05 ENCOUNTER — Other Ambulatory Visit (HOSPITAL_COMMUNITY): Payer: Self-pay | Admitting: Physical Medicine and Rehabilitation

## 2012-08-05 DIAGNOSIS — M4310 Spondylolisthesis, site unspecified: Secondary | ICD-10-CM

## 2012-08-05 DIAGNOSIS — IMO0002 Reserved for concepts with insufficient information to code with codable children: Secondary | ICD-10-CM

## 2012-08-06 ENCOUNTER — Encounter (HOSPITAL_COMMUNITY): Payer: Self-pay

## 2012-08-06 ENCOUNTER — Ambulatory Visit (HOSPITAL_COMMUNITY)
Admission: RE | Admit: 2012-08-06 | Discharge: 2012-08-06 | Disposition: A | Discharge status: Home / Self Care | Payer: Medicare Other | Source: Ambulatory Visit | Attending: Physical Medicine and Rehabilitation | Admitting: Physical Medicine and Rehabilitation

## 2012-08-06 DIAGNOSIS — IMO0002 Reserved for concepts with insufficient information to code with codable children: Secondary | ICD-10-CM

## 2012-08-06 DIAGNOSIS — M5126 Other intervertebral disc displacement, lumbar region: Secondary | ICD-10-CM | POA: Diagnosis not present

## 2012-08-06 DIAGNOSIS — M545 Low back pain, unspecified: Secondary | ICD-10-CM | POA: Insufficient documentation

## 2012-08-06 DIAGNOSIS — M4310 Spondylolisthesis, site unspecified: Secondary | ICD-10-CM

## 2012-08-06 DIAGNOSIS — M51379 Other intervertebral disc degeneration, lumbosacral region without mention of lumbar back pain or lower extremity pain: Secondary | ICD-10-CM | POA: Insufficient documentation

## 2012-08-06 DIAGNOSIS — M25559 Pain in unspecified hip: Secondary | ICD-10-CM | POA: Diagnosis not present

## 2012-08-06 DIAGNOSIS — M4120 Other idiopathic scoliosis, site unspecified: Secondary | ICD-10-CM | POA: Diagnosis not present

## 2012-08-06 DIAGNOSIS — M5137 Other intervertebral disc degeneration, lumbosacral region: Secondary | ICD-10-CM | POA: Diagnosis not present

## 2012-08-11 DIAGNOSIS — IMO0002 Reserved for concepts with insufficient information to code with codable children: Secondary | ICD-10-CM | POA: Diagnosis not present

## 2012-08-11 DIAGNOSIS — M48062 Spinal stenosis, lumbar region with neurogenic claudication: Secondary | ICD-10-CM | POA: Diagnosis not present

## 2012-08-11 DIAGNOSIS — M4310 Spondylolisthesis, site unspecified: Secondary | ICD-10-CM | POA: Diagnosis not present

## 2012-08-11 DIAGNOSIS — M5137 Other intervertebral disc degeneration, lumbosacral region: Secondary | ICD-10-CM | POA: Diagnosis not present

## 2012-08-11 DIAGNOSIS — G894 Chronic pain syndrome: Secondary | ICD-10-CM | POA: Diagnosis not present

## 2012-08-11 DIAGNOSIS — M47817 Spondylosis without myelopathy or radiculopathy, lumbosacral region: Secondary | ICD-10-CM | POA: Diagnosis not present

## 2012-08-11 DIAGNOSIS — M545 Low back pain: Secondary | ICD-10-CM | POA: Diagnosis not present

## 2012-08-18 ENCOUNTER — Ambulatory Visit (INDEPENDENT_AMBULATORY_CARE_PROVIDER_SITE_OTHER): Payer: Medicare Other | Admitting: Neurology

## 2012-08-18 ENCOUNTER — Ambulatory Visit (INDEPENDENT_AMBULATORY_CARE_PROVIDER_SITE_OTHER): Payer: Medicare Other

## 2012-08-18 ENCOUNTER — Encounter: Payer: Self-pay | Admitting: Neurology

## 2012-08-18 DIAGNOSIS — G544 Lumbosacral root disorders, not elsewhere classified: Secondary | ICD-10-CM

## 2012-08-18 DIAGNOSIS — L821 Other seborrheic keratosis: Secondary | ICD-10-CM | POA: Diagnosis not present

## 2012-08-18 DIAGNOSIS — Z85828 Personal history of other malignant neoplasm of skin: Secondary | ICD-10-CM | POA: Diagnosis not present

## 2012-08-18 DIAGNOSIS — M25551 Pain in right hip: Secondary | ICD-10-CM | POA: Insufficient documentation

## 2012-08-18 DIAGNOSIS — L259 Unspecified contact dermatitis, unspecified cause: Secondary | ICD-10-CM | POA: Diagnosis not present

## 2012-08-18 DIAGNOSIS — Z0289 Encounter for other administrative examinations: Secondary | ICD-10-CM

## 2012-08-18 DIAGNOSIS — M545 Low back pain: Secondary | ICD-10-CM

## 2012-08-18 HISTORY — DX: Pain in right hip: M25.551

## 2012-08-18 NOTE — Procedures (Signed)
History: 72 years old female, was referred by her pain management Dr.Bethea for evaluation of low back pain, radiating pain to her right lower extremity She presented with 2 years history of low back pain, getting worse over the past couple years, radiating pain to her right hip, right lateral leg, right anterior shin, recent few months, also similar, much mild involvement of her left side. She has gait difficulty due to pain, denied bilateral lower extremity paresthesia, she has chronic urinary urgency, status post bladder stimulator replacement  On examination, bilateral upper and lower extremity motor strength was normal, deep tendon reflex, patellar 1/1, Achilles reflexes 1 /1, decreased vibratory sensation to ankle level   Nerve conduction study: Bilateral peroneal sensory responses were normal. Bilateral peroneal to EDB, and tibial motor responses were normal. Bilateral tibial H. reflexes were normal and symmetric  Electromyography:  Selected needle examination was performed at bilateral lower extremity muscles, right lumbosacral paraspinal muscles.  Needle examination of bilateral tibialis anterior, tibialis posterior, medial gastrocnemius, right vastus lateralis, peroneal longus was normal.  There was no spontaneous activity at right lumbosacral paraspinal muscles, right L4, 5, S1,  In conclusion:  This is a normal study, there is no electrodiagnostic evidence of large fiber peripheral neuropathy, or active right lumbosacral radiculopathy

## 2012-08-20 DIAGNOSIS — D518 Other vitamin B12 deficiency anemias: Secondary | ICD-10-CM | POA: Diagnosis not present

## 2012-08-26 DIAGNOSIS — IMO0002 Reserved for concepts with insufficient information to code with codable children: Secondary | ICD-10-CM | POA: Diagnosis not present

## 2012-08-26 DIAGNOSIS — M48062 Spinal stenosis, lumbar region with neurogenic claudication: Secondary | ICD-10-CM | POA: Diagnosis not present

## 2012-08-26 DIAGNOSIS — M5137 Other intervertebral disc degeneration, lumbosacral region: Secondary | ICD-10-CM | POA: Diagnosis not present

## 2012-08-26 DIAGNOSIS — M4310 Spondylolisthesis, site unspecified: Secondary | ICD-10-CM | POA: Diagnosis not present

## 2012-08-26 DIAGNOSIS — M545 Low back pain: Secondary | ICD-10-CM | POA: Diagnosis not present

## 2012-08-26 DIAGNOSIS — G894 Chronic pain syndrome: Secondary | ICD-10-CM | POA: Diagnosis not present

## 2012-09-12 DIAGNOSIS — Q828 Other specified congenital malformations of skin: Secondary | ICD-10-CM | POA: Diagnosis not present

## 2012-09-12 DIAGNOSIS — M79609 Pain in unspecified limb: Secondary | ICD-10-CM | POA: Diagnosis not present

## 2012-09-16 DIAGNOSIS — IMO0002 Reserved for concepts with insufficient information to code with codable children: Secondary | ICD-10-CM | POA: Diagnosis not present

## 2012-09-16 DIAGNOSIS — M545 Low back pain: Secondary | ICD-10-CM | POA: Diagnosis not present

## 2012-09-17 DIAGNOSIS — R159 Full incontinence of feces: Secondary | ICD-10-CM | POA: Diagnosis not present

## 2012-09-17 DIAGNOSIS — R300 Dysuria: Secondary | ICD-10-CM | POA: Diagnosis not present

## 2012-09-17 DIAGNOSIS — T83711A Erosion of implanted vaginal mesh and other prosthetic materials to surrounding organ or tissue, initial encounter: Secondary | ICD-10-CM | POA: Diagnosis not present

## 2012-09-17 DIAGNOSIS — R32 Unspecified urinary incontinence: Secondary | ICD-10-CM | POA: Diagnosis not present

## 2012-09-17 DIAGNOSIS — N318 Other neuromuscular dysfunction of bladder: Secondary | ICD-10-CM | POA: Diagnosis not present

## 2012-09-17 DIAGNOSIS — N3941 Urge incontinence: Secondary | ICD-10-CM | POA: Diagnosis not present

## 2012-09-19 DIAGNOSIS — E785 Hyperlipidemia, unspecified: Secondary | ICD-10-CM | POA: Diagnosis not present

## 2012-09-19 DIAGNOSIS — H6120 Impacted cerumen, unspecified ear: Secondary | ICD-10-CM | POA: Diagnosis not present

## 2012-09-19 DIAGNOSIS — Z6828 Body mass index (BMI) 28.0-28.9, adult: Secondary | ICD-10-CM | POA: Diagnosis not present

## 2012-09-19 DIAGNOSIS — I10 Essential (primary) hypertension: Secondary | ICD-10-CM | POA: Diagnosis not present

## 2012-09-22 ENCOUNTER — Other Ambulatory Visit: Payer: Self-pay | Admitting: Neurosurgery

## 2012-09-22 DIAGNOSIS — I10 Essential (primary) hypertension: Secondary | ICD-10-CM | POA: Diagnosis not present

## 2012-09-22 DIAGNOSIS — M5410 Radiculopathy, site unspecified: Secondary | ICD-10-CM

## 2012-09-22 DIAGNOSIS — D518 Other vitamin B12 deficiency anemias: Secondary | ICD-10-CM | POA: Diagnosis not present

## 2012-09-22 DIAGNOSIS — E785 Hyperlipidemia, unspecified: Secondary | ICD-10-CM | POA: Diagnosis not present

## 2012-09-22 DIAGNOSIS — M549 Dorsalgia, unspecified: Secondary | ICD-10-CM

## 2012-09-22 DIAGNOSIS — H6120 Impacted cerumen, unspecified ear: Secondary | ICD-10-CM | POA: Diagnosis not present

## 2012-09-30 ENCOUNTER — Ambulatory Visit
Admission: RE | Admit: 2012-09-30 | Discharge: 2012-09-30 | Disposition: A | Discharge status: Home / Self Care | Payer: Medicare Other | Source: Ambulatory Visit | Attending: Neurosurgery | Admitting: Neurosurgery

## 2012-09-30 VITALS — BP 140/66 | HR 84

## 2012-09-30 DIAGNOSIS — M5137 Other intervertebral disc degeneration, lumbosacral region: Secondary | ICD-10-CM | POA: Diagnosis not present

## 2012-09-30 DIAGNOSIS — M5126 Other intervertebral disc displacement, lumbar region: Secondary | ICD-10-CM | POA: Diagnosis not present

## 2012-09-30 DIAGNOSIS — M4310 Spondylolisthesis, site unspecified: Secondary | ICD-10-CM | POA: Diagnosis not present

## 2012-09-30 DIAGNOSIS — M47817 Spondylosis without myelopathy or radiculopathy, lumbosacral region: Secondary | ICD-10-CM | POA: Diagnosis not present

## 2012-09-30 DIAGNOSIS — M5410 Radiculopathy, site unspecified: Secondary | ICD-10-CM

## 2012-09-30 DIAGNOSIS — M25551 Pain in right hip: Secondary | ICD-10-CM

## 2012-09-30 DIAGNOSIS — M4120 Other idiopathic scoliosis, site unspecified: Secondary | ICD-10-CM | POA: Diagnosis not present

## 2012-09-30 DIAGNOSIS — M549 Dorsalgia, unspecified: Secondary | ICD-10-CM

## 2012-09-30 DIAGNOSIS — M545 Low back pain: Secondary | ICD-10-CM

## 2012-09-30 MED ORDER — IOHEXOL 180 MG/ML  SOLN
15.0000 mL | Freq: Once | INTRAMUSCULAR | Status: AC | PRN
  Administered 2012-09-30: 15 mL via INTRATHECAL

## 2012-09-30 MED ORDER — DIAZEPAM 5 MG PO TABS
5.0000 mg | ORAL_TABLET | Freq: Once | ORAL | Status: AC
  Administered 2012-09-30: 5 mg via ORAL

## 2012-10-07 DIAGNOSIS — IMO0002 Reserved for concepts with insufficient information to code with codable children: Secondary | ICD-10-CM | POA: Diagnosis not present

## 2012-10-16 ENCOUNTER — Ambulatory Visit (HOSPITAL_COMMUNITY)
Admission: RE | Admit: 2012-10-16 | Discharge: 2012-10-16 | Disposition: A | Discharge status: Home / Self Care | Payer: Medicare Other | Source: Ambulatory Visit | Attending: Family Medicine | Admitting: Family Medicine

## 2012-10-16 DIAGNOSIS — M545 Low back pain, unspecified: Secondary | ICD-10-CM | POA: Diagnosis not present

## 2012-10-16 DIAGNOSIS — I10 Essential (primary) hypertension: Secondary | ICD-10-CM | POA: Insufficient documentation

## 2012-10-16 DIAGNOSIS — IMO0001 Reserved for inherently not codable concepts without codable children: Secondary | ICD-10-CM | POA: Insufficient documentation

## 2012-10-16 DIAGNOSIS — M25559 Pain in unspecified hip: Secondary | ICD-10-CM | POA: Insufficient documentation

## 2012-10-16 NOTE — Evaluation (Addendum)
Physical Therapy Evaluation  Patient Details  Name: Audrey [name] MRN: [id number] Date of Birth: 20-Sep-1940  Today's Date: 10/16/2012 Time: 0800-0845 PT Time Calculation (min): 45 min Charges: 1 eval             Visit#: 1 of 8  Re-eval: 11/15/12 Assessment Diagnosis: Low Back Pain Next MD Visit: Dr. Lovell Sheehan  Authorization: Medicare    Authorization Time Period:    Authorization Visit#: 1 of 8   Past Medical History:  Past Medical History  Diagnosis Date  . Allergic rhinitis   . Anxiety and depression   . Arthritis   . Obesity   . Colon polyp   . Diverticulosis   . HLD (hyperlipidemia)   . HTN (hypertension)   . Hypothyroidism   . Raynaud's syndrome   . Meniere's disease   . Gastric polyps   . GERD (gastroesophageal reflux disease)   . Fibromyalgia   . Sleep apnea   . Right hip pain 08/18/2012   Past Surgical History:  Past Surgical History  Procedure Laterality Date  . Abdominal hysterectomy  1982  . Tonsillectomy  1959  . Cataract extraction      bilateral  . Bladder repair  2005    ovaries, tubes removed; pelvic prolapse corrected; rectal repair  . Colonoscopy w/ biopsies and polypectomy  12/20/2009    diverticulosis, adenomatous polyps  . Upper gastrointestinal endoscopy  12/20/2009    GERD  . Foot surgery  1999    bilateral  . Basal cell carcinoma excision  03-31-01    face  . Lipoma excision      right arm  . Bladder surgery  01-2009    with mesh placement  . Laser surgery bladder  05-25-10    on mesh  . Nasolacrimal duct probing  09-12-10    right eye  . Nasolacrimal duct probing  09-26-10    left eye  . Removal of bladder mesh  03-01-11    [facility]  . Interstim implant placement  01/2012    [facility]  . Esophagogastroduodenoscopy (egd) with esophageal dilation  06/09/2012    Procedure: ESOPHAGOGASTRODUODENOSCOPY (EGD) WITH ESOPHAGEAL DILATION;  Surgeon: Iva Boop, MD;  Location: [facility] ENDOSCOPY;  Service: Endoscopy;  Laterality: N/A;     Subjective Symptoms/Limitations Symptoms: PMH: Fibromyalgia Pertinent History: Pt is referred to PT for lumbago.  She reports that for the past 10 years she has had a pain in her Rt hip and has migrated to her low back.  She has had PT over the past 10 years and she was 100% healed.  She reports that the pain is getting worse. She has interstim for urgency.  She has recieved injections from Dr. Eduard Clos, without any relief. Her c/co: pain worse at night, difficulty walking, difficulty carrying grocery bags, difficulty going shopping Limitations: Sitting;Standing;Walking;House hold activities How long can you stand comfortably?: difficulty  How long can you walk comfortably?: painful instantly, increased radicular pain Patient Stated Goal: Pt wishes to reduce pain and burning down her RLE Pain Assessment Currently in Pain?: Yes Pain Score:   6 (average pain) Pain Location: Back Pain Orientation: Lower;Right;Left Pain Type: Neuropathic pain;Chronic pain Pain Radiating Towards: RLE  Precautions/Restrictions  Precautions Precaution Comments: Hx of Cancer. Interstim placement  Balance Screening Balance Screen Has the patient fallen in the past 6 months: No (stumbling) Has the patient had a decrease in activity level because of a fear of falling? : Yes Is the patient reluctant to leave their home because of  a fear of falling? : No  Prior Functioning  Prior Function Driving: Yes Comments: knitting, croquetting, canning, raising a farm/garden, stays busy with housework and friends.    Sensation/Coordination/Flexibility/Functional Tests Sensation Light Touch: Impaired by gross assessment (to RLE) Coordination Gross Motor Movements are Fluid and Coordinated: No Coordination and Movement Description: impaired to Transverse abdominus and mutlifius Flexibility Thomas: Positive 90/90: Positive Functional Tests Functional Tests: + Ely's Functional Tests: Oswestry Disability Index  (ODI): 66%  Assessment RLE Strength Right Hip Flexion: 3/5 Right Hip Extension: 3/5 Right Hip ABduction: 3+/5 LLE Strength Left Hip Flexion: 4/5 Left Hip Extension: 3+/5 Left Hip ABduction: 4/5 Lumbar AROM Lumbar Flexion: WNL - painful flexion and going into extension- gower signt on return Lumbar Extension: Decreased 100% Lumbar - Right Side Bend: Decreased 25% - no pain Lumbar - Left Side Bend: Decreased 25% - pain to right side Palpation Palpation: significant pain and tenderness with fascial restrictions to   Exercise/Treatments Stretches Active Hamstring Stretch: 2 reps;30 seconds Single Knee to Chest Stretch: 2 reps;30 seconds Piriformis Stretch: 2 reps;30 seconds    Physical Therapy Assessment and Plan PT Assessment and Plan Clinical Impression Statement: Pt is a 72 year old female referred to PT for lumbago with impairments listed below.  She has a significant hx of fibromyalgia and urinary urgency which has been controlled with interstim placement.  At this time pt is demonstrating significant postural limitations which is causing increased fascial restrictions and pain.  Pt will benefit from skilled therapeutic intervention in order to improve on the following deficits: Pain;Increased fascial restricitons;Decreased balance;Impaired flexibility;Decreased strength;Decreased coordination Rehab Potential: Good PT Frequency: Min 2X/week PT Duration: 6 weeks PT Treatment/Interventions: Therapeutic activities;Therapeutic exercise;Balance training;Patient/family education;Manual techniques PT Plan: No modalities (interstim and hx of cancer). Continue with manual therapy to check alignment, hamstring and adductor MFR, core strengthening.  Progress to LE strengthening and postural strengthening.      Goals Home Exercise Program Pt will Perform Home Exercise Program: Independently PT Goal: Perform Home Exercise Program - Progress: Goal set today PT Short Term Goals Time to  Complete Short Term Goals: 3 weeks PT Short Term Goal 1: Pt will imporve her core coordination and demonstrate TrA and multifidus activiation with min cueing to progress towards approprirate posture. PT Short Term Goal 2: Pt will improve her lumbar extension to 10%.in order to mainiatin approrpriate posture.  PT Short Term Goal 3: Pt will report pain to lumbar region less than 4/10 for 50% of her day.  PT Short Term Goal 4: Pt will demonstrate minimal fascial restricins throughout lumbar. adductor, hamstring and gluteal region to improve LE flexibility.  PT Long Term Goals Time to Complete Long Term Goals:  (6 weeks. ) PT Long Term Goal 1: Pt will report pain less than 3/10 for 75% of her day for improved QOL. PT Long Term Goal 2: Pt will improve her core strength and coordination to WNL in order to tolerate standing and walking with approrpriate posture x30 minutes to be able to grocery shop with decreased pain.  Long Term Goal 3: Pt will improve her LE flexibility to WNL in order to tolerate sitting for greater than 1 hour to continue with her lesiure activities.  Long Term Goal 4: Pt will improve her ODI to less than 20% for improved percieved functional ability.   Problem List Patient Active Problem List   Diagnosis Date Noted  . Lumbago 10/16/2012  . Low back pain 08/18/2012  . Right hip pain 08/18/2012  .  Family history of von Willebrand disease 06/26/2011  . Abdominal pain, right upper quadrant 06/26/2011  . GERD 11/29/2009  . DYSPHAGIA 11/29/2009  . COLONIC POLYPS, BENIGN, HX OF 11/29/2009    General Behavior During Therapy: [facility] for tasks assessed/performed Cognition: [facility] for tasks performed PT Plan of Care PT Home Exercise Plan: see scanned report PT Patient Instructions: importance of posture, answered questions about diagnosis, discussed issues with pelvic floor function and importance with lumbar pain.   Consulted and Agree with Plan of Care: Patient  GP Functional  Assessment Tool Used: ODI: 66% Functional Limitation: Self care Self Care Current Status (Z6109): At least 60 percent but less than 80 percent impaired, limited or restricted Self Care Goal Status (U0454): At least 20 percent but less than 40 percent impaired, limited or restricted   , MPT, ATC 10/16/2012, 12:18 PM  Physician Documentation Your signature is required to indicate approval of the treatment plan as stated above.  Please sign and either send electronically or make a copy of this report for your files and return this physician signed original.   Please mark one 1.__approve of plan  2. ___approve of plan with the following conditions.   ______________________________                                                          _____________________ Physician Signature                                                                                                             Date

## 2012-10-21 ENCOUNTER — Ambulatory Visit (HOSPITAL_COMMUNITY)
Admission: RE | Admit: 2012-10-21 | Discharge: 2012-10-21 | Disposition: A | Discharge status: Home / Self Care | Payer: Medicare Other | Source: Ambulatory Visit | Attending: Family Medicine | Admitting: Family Medicine

## 2012-10-21 DIAGNOSIS — M25559 Pain in unspecified hip: Secondary | ICD-10-CM | POA: Insufficient documentation

## 2012-10-21 DIAGNOSIS — IMO0001 Reserved for inherently not codable concepts without codable children: Secondary | ICD-10-CM | POA: Diagnosis not present

## 2012-10-21 DIAGNOSIS — M545 Low back pain, unspecified: Secondary | ICD-10-CM | POA: Insufficient documentation

## 2012-10-21 DIAGNOSIS — I10 Essential (primary) hypertension: Secondary | ICD-10-CM | POA: Insufficient documentation

## 2012-10-21 NOTE — Progress Notes (Signed)
Physical Therapy Treatment Patient Details  Name: Audrey [name] MRN: [id number] Date of Birth: [birth year]  Today's Date: 10/21/2012 Time: 0928-1022 PT Time Calculation (min): 54 min Charge: Therex 21 min ([id number]), Manual x 30' (0930-0938, 02-1021)  Visit#: 2 of 8  Re-eval:   Assessment Diagnosis: Low Back Pain Next MD Visit: Dr. Lovell Sheehan  Authorization: Medicare  Authorization Time Period:    Authorization Visit#: 2 of 8   Subjective: Symptoms/Limitations Symptoms: Pt reported radicular pain down Rt LE to half of great toe pain scale 4/10 while sitting with pain increase to 8/10 with walking.  Pt complaint with HEP.   Pain Assessment Currently in Pain?: Yes Pain Score:   6 (range from 4-8/10) Pain Location: Back Pain Orientation: Lower;Right  Precautions/Restrictions  Precautions Precaution Comments: Hx of Cancer. Interstim placement  Exercise/Treatments Stretches Active Hamstring Stretch: 3 reps;30 seconds Seated Other Seated Lumbar Exercises: anterior/posterior rotation 5x with max cueing for technique Supine Ab Set: 10 reps;5 seconds;Limitations AB Set Limitations: PFC with tactile cueing Bridge: 5 reps Prone  Other Prone Lumbar Exercises: Multifidus 6x 10" with multimodal cueing  Manual Therapy Manual Therapy: Myofascial release Myofascial Release: Lumbar/sacral region (10:00-10:15) Other Manual Therapy: SI alignment check 930-938;  Prone femoral nerve glides to reduce radicular symptoms ([id number])  Physical Therapy Assessment and Plan PT Assessment and Plan Clinical Impression Statement: Began treatment for core strengthening and manual techniques to improve SI alignement,  reduce fascial restrictions and reduce radicular symptoms down Rt LE.  Unable to check SI alignment/complete MET due to decreased hip mobility and max cueing required for anterior/posterior rotation.  Significant reduced of fascial restrictions noted to lumbar region following manual. PT  Plan: No modalities (interstim and hx of cancer). Continue with manual therapy to check alignment, hamstring and adductor MFR, core strengthening.  Progress to LE strengthening and postural strengthening.      Goals    Problem List Patient Active Problem List   Diagnosis Date Noted  . Lumbago 10/16/2012  . Low back pain 08/18/2012  . Right hip pain 08/18/2012  . Family history of von Willebrand disease 06/26/2011  . Abdominal pain, right upper quadrant 06/26/2011  . GERD 11/29/2009  . DYSPHAGIA 11/29/2009  . COLONIC POLYPS, BENIGN, HX OF 11/29/2009    PT - End of Session Activity Tolerance: Patient tolerated treatment well General Behavior During Therapy: [facility] for tasks assessed/performed Cognition: WFL for tasks performed  GP    Juel Burrow 10/21/2012, 10:54 AM

## 2012-10-24 ENCOUNTER — Ambulatory Visit (HOSPITAL_COMMUNITY)
Admission: RE | Admit: 2012-10-24 | Discharge: 2012-10-24 | Disposition: A | Discharge status: Home / Self Care | Payer: Medicare Other | Source: Ambulatory Visit | Attending: Family Medicine | Admitting: Family Medicine

## 2012-10-24 DIAGNOSIS — M545 Low back pain: Secondary | ICD-10-CM

## 2012-10-24 NOTE — Progress Notes (Signed)
Physical Therapy Treatment Patient Details  Name: Audrey [name] MRN: [id number] Date of Birth: 02-18-1941  Today's Date: 10/24/2012 Time: 1307-1350 PT Time Calculation (min): 43 min Charges: Manual: [id number] Visit#: 3 of 8  Re-eval:      Authorization: Medicare  Authorization Time Period:    Authorization Visit#: 3 of 8   Subjective: Symptoms/Limitations Symptoms: Pt reports that she continues to have pain down her Rt leg. She felt great after last session. Is compliant with her HEP.  Pain Assessment Currently in Pain?: Yes Pain Score:  (FACES: 6/10) Pain Location: Back  Precautions/Restrictions     Exercise/Treatments  Manual Therapy Manual Therapy: Joint mobilization Joint Mobilization: Grade II to L2-4 SP to improve spinal mobility  Myofascial Release: Supine and Prone BLE: calf, adductors, hamstrings, quadriceps, hip flexors, gluteal, lumbar and thoracic region and to stomach adhesions to decrease fascial restrictions to improve mobility and decrease pain.  Other Manual Therapy: MET to correct: Lt hip inflare and pubic clearning with 75% correction   Physical Therapy Assessment and Plan PT Assessment and Plan Clinical Impression Statement: Treatment focused on manual techniques to improve alignment and decrease fascial restrictions to decrease pain and improve nerve mobility to decrease radicular pan ; PT Plan: No modalities (interstim and hx of cancer). Continue with manual therapy to check alignment, hamstring and adductor MFR, core strengthening.  Progress to LE strengthening and postural strengthening.      Goals    Problem List Patient Active Problem List   Diagnosis Date Noted  . Lumbago 10/16/2012  . Low back pain 08/18/2012  . Right hip pain 08/18/2012  . Family history of von Willebrand disease 06/26/2011  . Abdominal pain, right upper quadrant 06/26/2011  . GERD 11/29/2009  . DYSPHAGIA 11/29/2009  . COLONIC POLYPS, BENIGN, HX OF 11/29/2009    PT -  End of Session Activity Tolerance: Patient tolerated treatment well General Behavior During Therapy: [facility] for tasks assessed/performed Cognition: WFL for tasks performed  GP     , MPT, ATC 10/24/2012, 3:25 PM

## 2012-10-28 ENCOUNTER — Ambulatory Visit (HOSPITAL_COMMUNITY)
Admission: RE | Admit: 2012-10-28 | Discharge: 2012-10-28 | Disposition: A | Discharge status: Home / Self Care | Payer: Medicare Other | Source: Ambulatory Visit | Attending: Family Medicine | Admitting: Family Medicine

## 2012-10-28 DIAGNOSIS — M545 Low back pain: Secondary | ICD-10-CM

## 2012-10-28 NOTE — Progress Notes (Signed)
Physical Therapy Treatment Patient Details  Name: Audrey [name] MRN: [id number] Date of Birth: 11-Apr-1941  Today's Date: 10/28/2012 Time: 0935-1027 PT Time Calculation (min): 52 min Charge: Manual [id number]), TE [phone])  Visit#: 4 of 8  Re-eval: 11/15/12 Assessment Diagnosis: Low Back Pain Next MD Visit: Dr. Lovell Sheehan  Authorization: Medicare  Authorization Time Period:    Authorization Visit#: 4 of 8   Subjective: Symptoms/Limitations Symptoms: Pt reported busy weekend, continues to have pain in Rt buttocks with radicular symptoms with pain increase with walking, felt good after last session. Pain Assessment Currently in Pain?: Yes Pain Score:   4 Pain Location: Back Pain Orientation: Lower;Right Pain Radiating Towards: RLE  Precautions/Restrictions  Precautions Precaution Comments: Hx of Cancer. Interstim placement  Exercise/Treatments Stretches Active Hamstring Stretch: 3 reps;30 seconds Hip Flexor Stretch: 1 rep;60 seconds;Limitations Hip Flexor Stretch Limitations: supine edge of bed Piriformis Stretch: 2 reps;30 seconds Aerobic Tread Mill: 1.2 mph x 5 minutes, incline 5 following MET  Manual Therapy Myofascial Release: Supine and Prone BLE: calf, adductors, hamstrings, quadriceps and hip flexors, gluteal and lumbar region to decrease fascial restrictions Other Manual Therapy: MET for Rt anterior rotation, pubic clearing f/b gait on TM  Physical Therapy Assessment and Plan PT Assessment and Plan Clinical Impression Statement: MET for Rt anterior rotation with improved hip mobility, improved gait mechanics, pain reduced f/b manual techniques to reduce fascial restrictions.  Added hip flexor stretch to improve flexibilty Rt hip.  Pt reported no radicular symptoms and pain centralized to Rt groin region at end of session with lower pain scale.   PT Plan: No modalities (interstim and hx of cancer). Continue with manual therapy to check alignment, hamstring and  adductor MFR, core strengthening.  Progress to LE strengthening and postural strengthening.      Goals    Problem List Patient Active Problem List   Diagnosis Date Noted  . Lumbago 10/16/2012  . Low back pain 08/18/2012  . Right hip pain 08/18/2012  . Family history of von Willebrand disease 06/26/2011  . Abdominal pain, right upper quadrant 06/26/2011  . GERD 11/29/2009  . DYSPHAGIA 11/29/2009  . COLONIC POLYPS, BENIGN, HX OF 11/29/2009    PT - End of Session Activity Tolerance: Patient tolerated treatment well General Behavior During Therapy: [facility] for tasks assessed/performed Cognition: WFL for tasks performed  GP    Juel Burrow 10/28/2012, 3:05 PM

## 2012-10-31 ENCOUNTER — Ambulatory Visit (HOSPITAL_COMMUNITY)
Admission: RE | Admit: 2012-10-31 | Discharge: 2012-10-31 | Disposition: A | Discharge status: Home / Self Care | Payer: Medicare Other | Source: Ambulatory Visit | Attending: Family Medicine | Admitting: Family Medicine

## 2012-10-31 DIAGNOSIS — Z6829 Body mass index (BMI) 29.0-29.9, adult: Secondary | ICD-10-CM | POA: Diagnosis not present

## 2012-10-31 DIAGNOSIS — E538 Deficiency of other specified B group vitamins: Secondary | ICD-10-CM | POA: Diagnosis not present

## 2012-10-31 DIAGNOSIS — M545 Low back pain: Secondary | ICD-10-CM

## 2012-10-31 DIAGNOSIS — J209 Acute bronchitis, unspecified: Secondary | ICD-10-CM | POA: Diagnosis not present

## 2012-10-31 NOTE — Progress Notes (Signed)
Physical Therapy Treatment Patient Details  Name: Audrey [name] MRN: [id number] Date of Birth: 31-Oct-1940  Today's Date: 10/31/2012 Time: 6644-0347 PT Time Calculation (min): 42 min Charges Manual: [phone] Visit#: 5 of 8  Re-eval: 11/15/12    Authorization: Medicare  Authorization Time Period:    Authorization Visit#: 5 of 8   Subjective: Symptoms/Limitations Symptoms: The front of my right hip is really sore today.  I have been doing a lot my stretching.  Pain Assessment Currently in Pain?: Yes Pain Score:   6 Pain Location: Hip Pain Orientation: Left  Exercise/Treatments Manual Therapy Manual Therapy: Other (comment) Myofascial Release: Supine and Prone: Bil adductors, calf and gluteal region. with strain counter straing to Rt adductor to decreae fascial restrictions, muscles spasms to improve QOL and decrease pain.  Other Manual Therapy: MET for Rt iliac inflare Lt outflare, Rt anterior iliace rotation, pubic clearning  Physical Therapy Assessment and Plan PT Assessment and Plan Clinical Impression Statement: MET complete with 75% correction to Rt hip alignment. Improved fascial movement to BLE and had decreased pain.  PT Plan: No modalities (interstim and hx of cancer). Continue with manual therapy to check alignment, hamstring and adductor MFR, core strengthening.  Progress to LE strengthening and postural strengthening.      Goals Home Exercise Program Pt will Perform Home Exercise Program: Independently PT Goal: Perform Home Exercise Program - Progress: Met PT Short Term Goals Time to Complete Short Term Goals: 3 weeks PT Short Term Goal 1: Pt will imporve her core coordination and demonstrate TrA and multifidus activiation with min cueing to progress towards approprirate posture. PT Short Term Goal 1 - Progress: Progressing toward goal PT Short Term Goal 2: Pt will improve her lumbar extension to 10%.in order to mainiatin approrpriate posture.  PT Short Term Goal  2 - Progress: Progressing toward goal PT Short Term Goal 3: Pt will report pain to lumbar region less than 4/10 for 50% of her day.  PT Short Term Goal 3 - Progress: Progressing toward goal PT Short Term Goal 4: Pt will demonstrate minimal fascial restricins throughout lumbar. adductor, hamstring and gluteal region to improve LE flexibility.  PT Long Term Goals Time to Complete Long Term Goals:  (6 weeks. ) PT Long Term Goal 1: Pt will report pain less than 3/10 for 75% of her day for improved QOL. PT Long Term Goal 1 - Progress: Progressing toward goal PT Long Term Goal 2: Pt will improve her core strength and coordination to WNL in order to tolerate standing and walking with approrpriate posture x30 minutes to be able to grocery shop with decreased pain.  PT Long Term Goal 2 - Progress: Progressing toward goal Long Term Goal 3: Pt will improve her LE flexibility to WNL in order to tolerate sitting for greater than 1 hour to continue with her lesiure activities.  Long Term Goal 3 Progress: Progressing toward goal Long Term Goal 4: Pt will improve her ODI to less than 20% for improved percieved functional ability.  Long Term Goal 4 Progress: Progressing toward goal  Problem List Patient Active Problem List   Diagnosis Date Noted  . Lumbago 10/16/2012  . Low back pain 08/18/2012  . Right hip pain 08/18/2012  . Family history of von Willebrand disease 06/26/2011  . Abdominal pain, right upper quadrant 06/26/2011  . GERD 11/29/2009  . DYSPHAGIA 11/29/2009  . COLONIC POLYPS, BENIGN, HX OF 11/29/2009    PT - End of Session Activity Tolerance: Patient tolerated treatment well  General Behavior During Therapy: [facility] for tasks assessed/performed Cognition: WFL for tasks performed  GP     , MPT ATC 10/31/2012, 12:28 PM

## 2012-11-04 ENCOUNTER — Ambulatory Visit (HOSPITAL_COMMUNITY)
Admission: RE | Admit: 2012-11-04 | Discharge: 2012-11-04 | Disposition: A | Discharge status: Home / Self Care | Payer: Medicare Other | Source: Ambulatory Visit | Attending: Family Medicine | Admitting: Family Medicine

## 2012-11-04 NOTE — Progress Notes (Signed)
Physical Therapy Treatment Patient Details  Name: Audrey [name] MRN: [id number] Date of Birth: 1940-09-01  Today's Date: 11/04/2012 Time: 0940 (Pt was 10' late)-1020 PT Time Calculation (min): 40 min  Visit#: 6 of 8  Re-eval: 11/15/12 Authorization: Medicare  Authorization Visit#: 6 of 8  Charges:  therex (24') [phone]; manual [phone]   Subjective: Symptoms/Limitations Symptoms: Pt. reports overal improvement.  States she had some discomfort yesterday eveneing, however was up all day on her feet.  Currently 3/10 pain that is mostly in her Rt lumbar region around to her hip and down her Rt LE. Pain Assessment Currently in Pain?: Yes Pain Score:   3 Pain Location: Hip Pain Orientation: Right   Exercise/Treatments Supine Ab Set: 10 reps;5 seconds;Limitations AB Set Limitations: PFC with tactile cueing Clam: 10 reps Bent Knee Raise: 10 reps Bridge: 10 reps Straight Leg Raise: 5 reps;Limitations Straight Leg Raises Limitations: bilaterally    Manual Therapy Manual Therapy: Other (comment) Myofascial Release: Prone:  Rt lumbar, gluteal region into Rt hip.  Supine:  SCS to Rt adductor  Physical Therapy Assessment and Plan PT Assessment and Plan Clinical Impression Statement: Pt in good alignment today without need for MET.  SCS continued for Rt hip adductor and added more stabilization exercises for core and pelvic floor musculature.  Focused manual techniques to Rt. lumbar and glutte region to decrease pain and adhesions.  PT Plan: No modalities (interstim and hx of cancer). Continue with manual therapy to check alignment, hamstring and adductor MFR, core strengthening.  Progress to LE strengthening and postural strengthening.          Problem List Patient Active Problem List   Diagnosis Date Noted  . Lumbago 10/16/2012  . Low back pain 08/18/2012  . Right hip pain 08/18/2012  . Family history of von Willebrand disease 06/26/2011  . Abdominal pain, right upper  quadrant 06/26/2011  . GERD 11/29/2009  . DYSPHAGIA 11/29/2009  . COLONIC POLYPS, BENIGN, HX OF 11/29/2009    PT - End of Session Activity Tolerance: Patient tolerated treatment well General Behavior During Therapy: [facility] for tasks assessed/performed Cognition: WFL for tasks performed   Lurena Nida, PTA/CLT 11/04/2012, 10:25 AM

## 2012-11-05 ENCOUNTER — Ambulatory Visit (HOSPITAL_COMMUNITY)
Admission: RE | Admit: 2012-11-05 | Discharge: 2012-11-05 | Disposition: A | Discharge status: Home / Self Care | Payer: Medicare Other | Source: Ambulatory Visit

## 2012-11-05 DIAGNOSIS — M545 Low back pain: Secondary | ICD-10-CM

## 2012-11-05 NOTE — Progress Notes (Signed)
Physical Therapy Treatment Patient Details  Name: SABRE ROMBERGER MRN: [id number] Date of Birth: Jan 19, 1941  Today's Date: 11/05/2012 Time: 4098-1191 PT Time Calculation (min): 52 min Charge ;Therex 27' 4782-9562; Manual 19' 1716-1735  Visit#: 7 of 8  Re-eval: 11/15/12 Assessment Diagnosis: Low Back Pain Next MD Visit: Dr. Lovell Sheehan  Authorization: Medicare  Authorization Time Period:    Authorization Visit#: 7 of 8   Subjective: Symptoms/Limitations Symptoms: Pt reports she is feeling better stated she walks better and not in as much pain.  Pt has been on her feet all day Pain Assessment Currently in Pain?: Yes Pain Score:   3 Pain Location: Hip Pain Orientation: Right  Precautions/Restrictions  Precautions Precaution Comments: Hx of Cancer. Interstim placement  Exercise/Treatments Stretches Passive Hamstring Stretch: 2 reps;30 seconds ITB Stretch: 2 reps;30 seconds;Limitations ITB Stretch Limitations: passive Piriformis Stretch: 1 rep;30 seconds Seated Other Seated Lumbar Exercises: anterior/posterior rotation 5x with max cueing for technique Other Seated Lumbar Exercises: Sitting on theraball hip rotation A/P and R/L; PFC and TrA activation on ball 5x 10"; marching 5x 5", dead bug 5x 5"  Manual Therapy Myofascial Release: Prone: Bil gluteal region into R hip.  Supine STM to Rt adductor. Other Manual Therapy: No MET required;  Femoral nerve glides in prone position to Rt LE.    Physical Therapy Assessment and Plan PT Assessment and Plan Clinical Impression Statement: Pt with improved core activation and coordination this session.  No MET required.  Therex complete on green theraball today with improved hip mobility noted and core coordination with change in surface.  Femoral nerve glides complete to resolve  radicular symptoms Rt and MFR/ STM complete to Bil gluteal region into Rt hip and adductors.  Pt with pain reduced, radicular symptoms resolved and improve gait  mechanics at end of session.   PT Plan: Re-eval next session. Continue current POC with manual therapy to check SI alignment, hamstring and adductor MFR, core strengthening.  Progress to LE and postural strengthening.  NO MODALITIES (interstim and hx of cancer.)    Goals    Problem List Patient Active Problem List   Diagnosis Date Noted  . Lumbago 10/16/2012  . Low back pain 08/18/2012  . Right hip pain 08/18/2012  . Family history of von Willebrand disease 06/26/2011  . Abdominal pain, right upper quadrant 06/26/2011  . GERD 11/29/2009  . DYSPHAGIA 11/29/2009  . COLONIC POLYPS, BENIGN, HX OF 11/29/2009    PT - End of Session Activity Tolerance: Patient tolerated treatment well General Behavior During Therapy: [facility] for tasks assessed/performed Cognition: WFL for tasks performed  GP    Juel Burrow 11/05/2012, 5:56 PM

## 2012-11-07 ENCOUNTER — Ambulatory Visit (HOSPITAL_COMMUNITY): Payer: Medicare Other

## 2012-11-11 ENCOUNTER — Ambulatory Visit (HOSPITAL_COMMUNITY): Payer: Medicare Other

## 2012-11-12 ENCOUNTER — Ambulatory Visit (HOSPITAL_COMMUNITY): Payer: Medicare Other

## 2012-11-13 ENCOUNTER — Ambulatory Visit (HOSPITAL_COMMUNITY)
Admission: RE | Admit: 2012-11-13 | Discharge: 2012-11-13 | Disposition: A | Discharge status: Home / Self Care | Payer: Medicare Other | Source: Ambulatory Visit | Attending: Family Medicine | Admitting: Family Medicine

## 2012-11-13 NOTE — Evaluation (Signed)
Physical Therapy Re-evaluation  Patient Details  Name: ARDEN AXON MRN: [id number] Date of Birth: 1940-07-27  Today's Date: 11/13/2012 Time: 2130-8657 PT Time Calculation (min): 36 min              Visit#: 8 of 13  Re-eval: 11/15/12 Assessment Diagnosis: Low Back Pain Next MD Visit: Dr. Lovell Sheehan Charges: MMT x 1 [phone]) Self-care x 23' (Going over goals, ODI, discussing progress) ([phone])  Authorization: Medicare    Authorization Visit#: 8 of 13   Subjective Symptoms/Limitations Symptoms: Pt states that she is now able to negotiate stairs reciprocally. Pain Assessment Currently in Pain?: Yes Pain Score:   2 Pain Location: Hip Pain Orientation: Right  Precautions/Restrictions  Precautions Precaution Comments: Hx of Cancer. Interstim placement  RLE Strength Right Hip Flexion: 5/5 (was 3/5) Right Hip Extension:  (4+/5 was 3/5) Right Hip ABduction: 5/5 (was 3+/5) LLE Strength Left Hip Flexion: 5/5 (was 3+/5) Left Hip Extension:  (4+/5was 3+/5) Left Hip ABduction: 5/5 (was 4/5) Lumbar AROM Lumbar Flexion: WNL Lumbar Extension: WNL Lumbar - Right Side Bend: WNL Lumbar - Left Side Bend: WNL  Physical Therapy Assessment and Plan PT Assessment and Plan Clinical Impression Statement: Ms. Hester has attended 8 OP PT visits over 4 weeks with the following findings: has progressed well with therapy. Strength and ROM has significantly improved.  Reports greater ease with ascending and descending steps to [place] beach house, able to walk on the beach while carrying fishing equipment which she has not been able to do for years. Pt continues to be limited by hip and leg pain (3-4/10 on average), especially at night. Pt would benefit from continuing skilled PT to decrease pain to improve QOL.  Frequency: 2x/week Duration: 2 weeks PT Plan: Continue with manual technqiues and flexibility to help decrease pain to hip and lumbar region.   Goals Time to Complete Short Term Goals: 3  weeks PT Short Term Goal 1: Pt will imporve her core coordination and demonstrate TrA and multifidus activiation with min cueing to progress towards approprirate posture.: Progressing toward goal PT Short Term Goal 2: Pt will improve her lumbar extension to 10% in order to mainiatin approrpriate posture. : Met PT Short Term Goal 3: Pt will report pain to lumbar region less than 4/10 for 50% of her day. : Met PT Short Term Goal 4: Pt will demonstrate minimal fascial restricins throughout lumbar. adductor, hamstring and gluteal region to improve LE flexibility. : Progressing toward goal PT Long Term Goals:  (6 weeks. ) PT Long Term Goal 1: Pt will report pain less than 3/10 for 75% of her day for improved QOL.: Met PT Long Term Goal 2: Pt will improve her core strength and coordination to WNL in order to tolerate standing and walking with approrpriate posture x30 minutes to be able to grocery shop with decreased pain. : Met Long Term Goal 3: Pt will improve her LE flexibility to WNL in order to tolerate sitting for greater than 1 hour to continue with her lesiure activities. : Met Long Term Goal 4: Pt will improve her ODI to less than 20% for improved percieved functional ability. : Progressing toward goal  Problem List Patient Active Problem List   Diagnosis Date Noted  . Lumbago 10/16/2012  . Low back pain 08/18/2012  . Right hip pain 08/18/2012  . Family history of von Willebrand disease 06/26/2011  . Abdominal pain, right upper quadrant 06/26/2011  . GERD 11/29/2009  . DYSPHAGIA 11/29/2009  . COLONIC POLYPS,  BENIGN, HX OF 11/29/2009    PT - End of Session Activity Tolerance: Patient tolerated treatment well General Behavior During Therapy: WFL for tasks assessed/performed Cognition: WFL for tasks performed  GP Functional Assessment Tool Used: ODI: 24% Functional Limitation: Self care Self Care Current Status (B1478): At least 20 percent but less than 40 percent impaired, limited or  restricted Self Care Goal Status (G9562): At least 20 percent but less than 40 percent impaired, limited or restricted  Seth Bake, PTA; Annett Fabian, MPT, ATC  11/13/2012, 10:17 AM  Physician Documentation Your signature is required to indicate approval of the treatment plan as stated above.  Please sign and either send electronically or make a copy of this report for your files and return this physician signed original.   Please mark one 1.__approve of plan  2. ___approve of plan with the following conditions.   ______________________________                                                          _____________________ Physician Signature                                                                                                             Date

## 2012-11-14 ENCOUNTER — Ambulatory Visit (HOSPITAL_COMMUNITY)
Admission: RE | Admit: 2012-11-14 | Discharge: 2012-11-14 | Disposition: A | Discharge status: Home / Self Care | Payer: Medicare Other | Source: Ambulatory Visit | Attending: Family Medicine | Admitting: Family Medicine

## 2012-11-14 DIAGNOSIS — M545 Low back pain: Secondary | ICD-10-CM

## 2012-11-14 NOTE — Progress Notes (Signed)
Physical Therapy Treatment Patient Details  Name: Audrey [name] MRN: [id number] Date of Birth: 06-08-1940  Today's Date: 11/14/2012 Time: 1111-1205 PT Time Calculation (min): 54 min Charge: Manual 21' 1111-1132, TE 30' 1135-1205  Visit#: 9 of 13  Re-eval: 11/27/12 Assessment Diagnosis: Low Back Pain Next MD Visit: Dr. Lovell Sheehan 01/06/2013  Authorization: Medicare  Authorization Time Period: Roselind Rily done 8th visit  Authorization Visit#: 9 of 13   Subjective: Symptoms/Limitations Symptoms: Pt brought in her therapeautic ball for therapy today.  Pt reports family members stateing they have noticed she has improved walking and increase ease with stairs. Pain Assessment Currently in Pain?: Yes Pain Score:   3 Pain Location: Back Pain Orientation: Lower;Right  Precautions/Restrictions  Precautions Precaution Comments: Hx of Cancer. Interstim placement  Exercise/Treatments Seated Other Seated Lumbar Exercises: Sitting on theraball marching, dead bug, and knee extension 5x each Supine Bridge: 10 reps;Limitations Bridge Limitations: feet on ball 10x, 10x h/s curl Large Ball Abdominal Isometric: 5 reps;5 seconds Large Ball Oblique Isometric: 5 reps;5 seconds Prone  Straight Leg Raise: 10 reps;3 seconds  Manual Therapy Myofascial Release: Prone MFR to Bil Lumbar region to coccyx, Bil gluteal region to Rt hip and STM to Rt hamstrings and adductor to reduce tightness and pain  Physical Therapy Assessment and Plan PT Assessment and Plan Clinical Impression Statement: Manual techniques complete to reduce fascial restrictions in lumbar/sacral, Rt glut to hip and adductors regions for pain control.  Therex focus on glut and core strengthening on therapetic ball that pt brought from home.  Pt able to follow instructtions and given written printout of exercises to continue these exercises at home for maximum benefits.  Noted core instabilty on dynamic surface. PT Plan: Continue with current  POC towards PT goals.  Continue manual techniques to decrease pain and fascial restrictions.    Goals    Problem List Patient Active Problem List   Diagnosis Date Noted  . Lumbago 10/16/2012  . Low back pain 08/18/2012  . Right hip pain 08/18/2012  . Family history of von Willebrand disease 06/26/2011  . Abdominal pain, right upper quadrant 06/26/2011  . GERD 11/29/2009  . DYSPHAGIA 11/29/2009  . COLONIC POLYPS, BENIGN, HX OF 11/29/2009    PT - End of Session Activity Tolerance: Patient tolerated treatment well General Behavior During Therapy: WFL for tasks assessed/performed Cognition: WFL for tasks performed PT Plan of Care PT Home Exercise Plan: See printout of therapeutic ball exercises  GP    Juel Burrow 11/14/2012, 12:21 PM

## 2012-11-18 ENCOUNTER — Ambulatory Visit (HOSPITAL_COMMUNITY)
Admission: RE | Admit: 2012-11-18 | Discharge: 2012-11-18 | Disposition: A | Discharge status: Home / Self Care | Payer: Medicare Other | Source: Ambulatory Visit | Attending: Family Medicine | Admitting: Family Medicine

## 2012-11-18 DIAGNOSIS — M25559 Pain in unspecified hip: Secondary | ICD-10-CM | POA: Diagnosis not present

## 2012-11-18 DIAGNOSIS — IMO0001 Reserved for inherently not codable concepts without codable children: Secondary | ICD-10-CM | POA: Diagnosis not present

## 2012-11-18 DIAGNOSIS — M545 Low back pain, unspecified: Secondary | ICD-10-CM | POA: Diagnosis not present

## 2012-11-18 DIAGNOSIS — I10 Essential (primary) hypertension: Secondary | ICD-10-CM | POA: Insufficient documentation

## 2012-11-18 NOTE — Progress Notes (Signed)
Physical Therapy Treatment Patient Details  Name: Audrey [name] MRN: [id number] Date of Birth: 11-Jul-1940  Today's Date: 11/18/2012 Time: 0932-1015 PT Time Calculation (min): 43 min  Visit#: 10 of 13  Re-eval: 11/27/12 Charges: Manual x 38' [phone])  Authorization: Medicare  Authorization Time Period: Gcode done 8th visit  Authorization Visit#: 10 of 13   Subjective: Symptoms/Limitations Symptoms: Pt states that she was very active this weekend. She feels that she "over did it". Pain Assessment Currently in Pain?: Yes Pain Score:   4 Pain Location: Back   Exercise/Treatments  Manual Therapy Myofascial Release: To right lumbar and gluteal musculature to decrease pain and tightness Other Manual Therapy: Manual stretching of bilateral hips in prone and supine  Physical Therapy Assessment and Plan PT Assessment and Plan Clinical Impression Statement: Tx focus on decrease pain and fascial restrictions. Manual strengthening completed to bilateral hips. MFR techniques completed to right lumbar and gluteal musculature. Pt reports pain decrease to 3/10 at end of session. PT Plan: Continue with current POC towards PT goals.  Continue manual techniques to decrease pain and fascial restrictions.     Problem List Patient Active Problem List   Diagnosis Date Noted  . Lumbago 10/16/2012  . Low back pain 08/18/2012  . Right hip pain 08/18/2012  . Family history of von Willebrand disease 06/26/2011  . Abdominal pain, right upper quadrant 06/26/2011  . GERD 11/29/2009  . DYSPHAGIA 11/29/2009  . COLONIC POLYPS, BENIGN, HX OF 11/29/2009    PT - End of Session Activity Tolerance: Patient tolerated treatment well General Behavior During Therapy: [facility] for tasks assessed/performed Cognition: WFL for tasks performed  Seth Bake, PTA  11/18/2012, 12:31 PM

## 2012-11-20 ENCOUNTER — Ambulatory Visit (HOSPITAL_COMMUNITY)
Admission: RE | Admit: 2012-11-20 | Discharge: 2012-11-20 | Disposition: A | Discharge status: Home / Self Care | Payer: Medicare Other | Source: Ambulatory Visit | Attending: *Deleted | Admitting: *Deleted

## 2012-11-20 DIAGNOSIS — M545 Low back pain: Secondary | ICD-10-CM

## 2012-11-20 NOTE — Progress Notes (Signed)
Physical Therapy Treatment Patient Details  Name: Audrey [name] MRN: [id number] Date of Birth: 1940-05-25  Today's Date: 11/20/2012 Time: 4098-1191 PT Time Calculation (min): 41 min Charge: Massage 35' 1650-1725  Visit#: 11 of 13  Re-eval: 11/27/12    Authorization: Medicare  Authorization Time Period: Gcode done 8th visit  Authorization Visit#: 11 of 13   Subjective: Symptoms/Limitations Symptoms: Pt stated she has done a lot of picking, jarring and canning berries today, pain scale 3/10.  Pt reported completeing more functional activities, feels improved mechanics with gait and stairs. Pain Assessment Currently in Pain?: Yes Pain Score: 3  Pain Location: Back Pain Orientation: Lower  Objective :  Exercise/Treatments Manual Therapy Myofascial Release: To Bil lumbar and gluteal musculature to decrease pain and tightness 1650-1725  Physical Therapy Assessment and Plan PT Assessment and Plan Clinical Impression Statement: Manual MFR and soft tissue massage following to Bil lumbar and gluteal region.  Significant reduction of fascial restrictions noted coccyx region.  Able to reduce but not fully resolve spasms to gluteal region L>R this session.  Pt reported pain free at end of  session.  Pt encouraged to drink extra water following tx to reduce risk of headaches. PT Plan: Continue with current POC towards PT goals.  Continue manual techniques to decrease pain and fascial restrictions.    Goals    Problem List Patient Active Problem List   Diagnosis Date Noted  . Lumbago 10/16/2012  . Low back pain 08/18/2012  . Right hip pain 08/18/2012  . Family history of von Willebrand disease 06/26/2011  . Abdominal pain, right upper quadrant 06/26/2011  . GERD 11/29/2009  . DYSPHAGIA 11/29/2009  . COLONIC POLYPS, BENIGN, HX OF 11/29/2009    PT - End of Session Activity Tolerance: Patient tolerated treatment well General Behavior During Therapy: [facility] for tasks  assessed/performed Cognition: WFL for tasks performed  GP    Juel Burrow 11/20/2012, 5:52 PM

## 2012-11-25 ENCOUNTER — Ambulatory Visit (HOSPITAL_COMMUNITY)
Admission: RE | Admit: 2012-11-25 | Discharge: 2012-11-25 | Disposition: A | Discharge status: Home / Self Care | Payer: Medicare Other | Source: Ambulatory Visit | Attending: *Deleted | Admitting: *Deleted

## 2012-11-25 NOTE — Progress Notes (Signed)
Physical Therapy Treatment Patient Details  Name: Audrey [name] MRN: [id number] Date of Birth: 1940/10/25  Today's Date: 11/25/2012 Time: 0940-1020 PT Time Calculation (min): 40 min  Visit#: 12 of 13  Re-eval: 11/27/12 Charges: Manual x 32' (0940-1012)   Authorization: Medicare  Authorization Time Period: Gcode done 8th visit  Authorization Visit#: 12 of 13   Subjective: Symptoms/Limitations Symptoms: Pt states that her pain is elevated today.  Pain Assessment Currently in Pain?: Yes Pain Score: 5  Pain Location: Back Pain Orientation: Lower    Exercise/Treatments Treadmill x 5' @ 1.2 after MET (1015-1020) Manual Therapy Myofascial Release: To Bil lumbar and gluteal musculature to decrease pain and tightness  Other Manual Therapy: Manual stretching of bilateral hips in prone; MET completed to correct bilateral out flair along with pubic clearing  Physical Therapy Assessment and Plan PT Assessment and Plan Clinical Impression Statement: Tx focus on decreaseing pain and improveing SI allignement. MET completed to correct rigt ht and left outflair. Pubic clearing also completed. Manual stretching compelted to bilateral hips as well. Eduacted pt on the improtacnce of gradually returning to normal activities letting pain be her guide. PT Plan: Reassess next session.      Problem List Patient Active Problem List   Diagnosis Date Noted  . Lumbago 10/16/2012  . Low back pain 08/18/2012  . Right hip pain 08/18/2012  . Family history of von Willebrand disease 06/26/2011  . Abdominal pain, right upper quadrant 06/26/2011  . GERD 11/29/2009  . DYSPHAGIA 11/29/2009  . COLONIC POLYPS, BENIGN, HX OF 11/29/2009    PT - End of Session Activity Tolerance: Patient tolerated treatment well General Behavior During Therapy: [facility] for tasks assessed/performed Cognition: WFL for tasks performed  Seth Bake, PTA 11/25/2012, 11:41 AM

## 2012-11-27 ENCOUNTER — Ambulatory Visit (HOSPITAL_COMMUNITY)
Admission: RE | Admit: 2012-11-27 | Discharge: 2012-11-27 | Disposition: A | Discharge status: Home / Self Care | Payer: Medicare Other | Source: Ambulatory Visit | Attending: Family Medicine | Admitting: Family Medicine

## 2012-11-27 NOTE — Progress Notes (Addendum)
Physical Therapy Re-Evaluation/Dishcarge  Patient Details  Name: Audrey [name] MRN: [id number] Date of Birth: 12/13/1940  Today's Date: 11/27/2012 Time: 5621-3086 PT Time Calculation (min): 45 min Charges: Self Care: [phone] MMT/ROM: [phone]             Visit#: 13 of 13  Re-eval: 11/27/12 Assessment Diagnosis: Low Back Pain Next MD Visit: Dr. Lovell Sheehan 01/06/2013  Authorization: Medicare    Authorization Time Period: Roselind Rily done 8th visit  Authorization Visit#: 13 of 13   Subjective Symptoms/Limitations Symptoms: Pt states that her pain increaes when her activity increases. Pain Assessment Currently in Pain?: Yes Pain Score: 4  Pain Location: Back Pain Orientation: Lower  Precautions/Restrictions  Precautions Precaution Comments: Hx of Cancer. Interstim placement  Sensation/Coordination/Flexibility/Functional Tests Functional Tests Functional Tests: Oswestry Disability Index (ODI): 22% (was 66%)  Assessment RLE Strength Right Hip Flexion: 5/5 (was 5/5) Right Hip Extension:  (4+/5, was 3+/5) Right Hip ABduction: 5/5 (was 5/5) LLE Strength Left Hip Flexion: 5/5 (was 5/5) Left Hip Extension:  (4+/5, was 3+/5) Left Hip ABduction: 5/5 (was 5/5) Lumbar AROM Lumbar Flexion: WNL Lumbar Extension: WNL Lumbar - Right Side Bend: WNL Lumbar - Left Side Bend: WNL  Mobility/Balance  Ambulation/Gait Ambulation/Gait: Yes Gait Pattern: Within Functional Limits Posture/Postural Control Posture/Postural Control: Postural limitations Postural Limitations: min cueing for proper posture able to maintain on her own (was slouched w/decreased lordosis)     Physical Therapy Assessment and Plan PT Assessment and Plan Clinical Impression Statement: Ms. Bojarski has attended 13 OP PT visits over the past 6 weeks the the following findings: Pt progressed well with therapy. Pt does have increased pain from returning too quickly to strenuous activities. Educated pt on importance of gradually  coming back to activities. Pt has met all goals. ODI has improved 22%. Pt is independent with HEP.  Encouraged to pt f/u with MD to discuss remaining pain concerns to her hip and may want to find an orthopedic MD to discuss her hip pain.  At this time will d/c from PT.  PT Plan: D/C to HEP    Goals PT Short Term Goals Time to Complete Short Term Goals: 3 weeks PT Short Term Goal 1: Pt will imporve her core coordination and demonstrate TrA and multifidus activiation with min cueing to progress towards approprirate posture. PT Short Term Goal 1 - Progress: Met PT Short Term Goal 2: Pt will improve her lumbar extension to 10% in order to mainiatin approrpriate posture.  PT Short Term Goal 2 - Progress: Met PT Short Term Goal 3: Pt will report pain to lumbar region less than 4/10 for 50% of her day.  PT Short Term Goal 3 - Progress: Met PT Short Term Goal 4: Pt will demonstrate minimal fascial restricins throughout lumbar. adductor, hamstring and gluteal region to improve LE flexibility.  PT Short Term Goal 4 - Progress: Met PT Long Term Goals Time to Complete Long Term Goals:  (6 weeks. ) PT Long Term Goal 1: Pt will report pain less than 3/10 for 75% of her day for improved QOL. PT Long Term Goal 1 - Progress: Met PT Long Term Goal 2: Pt will improve her core strength and coordination to WNL in order to tolerate standing and walking with approrpriate posture x30 minutes to be able to grocery shop with decreased pain.  PT Long Term Goal 2 - Progress: Met Long Term Goal 3: Pt will improve her LE flexibility to WNL in order to tolerate sitting for greater than 1  hour to continue with her lesiure activities.  Long Term Goal 3 Progress: Met Long Term Goal 4: Pt will improve her ODI to less than 20% for improved percieved functional ability.   Problem List Patient Active Problem List   Diagnosis Date Noted  . Lumbago 10/16/2012  . Low back pain 08/18/2012  . Right hip pain 08/18/2012  . Family  history of von Willebrand disease 06/26/2011  . Abdominal pain, right upper quadrant 06/26/2011  . GERD 11/29/2009  . DYSPHAGIA 11/29/2009  . COLONIC POLYPS, BENIGN, HX OF 11/29/2009    PT - End of Session Activity Tolerance: Patient tolerated treatment well General Behavior During Therapy: WFL for tasks assessed/performed Cognition: WFL for tasks performed Pt instructions: discussed ODI, discussed importance of continuing with HEP, discussed common findings with pain with increased activities.  Encouraged to f/u with MD if needed and finding a massage therapist for maintenance. Pt agreeable to this after much discussion.    GP Functional Assessment Tool Used: ODI: 22% Functional Limitation: Self care Self Care Goal Status (Z6109): At least 20 percent but less than 40 percent impaired, limited or restricted Self Care Discharge Status [phone]): At least 20 percent but less than 40 percent impaired, limited or restricted  Antonieta Iba, PTA;  , MPT, ATC 11/27/2012, 11:36 AM  Physician Documentation Your signature is required to indicate approval of the treatment plan as stated above.  Please sign and either send electronically or make a copy of this report for your files and return this physician signed original.   Please mark one 1.__approve of plan  2. ___approve of plan with the following conditions.   ______________________________                                                          _____________________ Physician Signature                                                                                                             Date

## 2012-12-12 DIAGNOSIS — M79609 Pain in unspecified limb: Secondary | ICD-10-CM | POA: Diagnosis not present

## 2012-12-12 DIAGNOSIS — Q828 Other specified congenital malformations of skin: Secondary | ICD-10-CM | POA: Diagnosis not present

## 2012-12-24 ENCOUNTER — Other Ambulatory Visit: Payer: Self-pay

## 2012-12-24 DIAGNOSIS — Z6829 Body mass index (BMI) 29.0-29.9, adult: Secondary | ICD-10-CM | POA: Diagnosis not present

## 2012-12-24 DIAGNOSIS — I10 Essential (primary) hypertension: Secondary | ICD-10-CM | POA: Diagnosis not present

## 2012-12-24 DIAGNOSIS — M5137 Other intervertebral disc degeneration, lumbosacral region: Secondary | ICD-10-CM | POA: Diagnosis not present

## 2012-12-24 DIAGNOSIS — E538 Deficiency of other specified B group vitamins: Secondary | ICD-10-CM | POA: Diagnosis not present

## 2013-01-06 DIAGNOSIS — M549 Dorsalgia, unspecified: Secondary | ICD-10-CM | POA: Diagnosis not present

## 2013-01-06 DIAGNOSIS — IMO0002 Reserved for concepts with insufficient information to code with codable children: Secondary | ICD-10-CM | POA: Diagnosis not present

## 2013-01-06 DIAGNOSIS — E663 Overweight: Secondary | ICD-10-CM | POA: Diagnosis not present

## 2013-01-23 DIAGNOSIS — D518 Other vitamin B12 deficiency anemias: Secondary | ICD-10-CM | POA: Diagnosis not present

## 2013-01-28 DIAGNOSIS — I10 Essential (primary) hypertension: Secondary | ICD-10-CM | POA: Diagnosis not present

## 2013-01-28 DIAGNOSIS — Z6829 Body mass index (BMI) 29.0-29.9, adult: Secondary | ICD-10-CM | POA: Diagnosis not present

## 2013-01-28 DIAGNOSIS — L0291 Cutaneous abscess, unspecified: Secondary | ICD-10-CM | POA: Diagnosis not present

## 2013-01-28 DIAGNOSIS — Z23 Encounter for immunization: Secondary | ICD-10-CM | POA: Diagnosis not present

## 2013-01-28 DIAGNOSIS — E785 Hyperlipidemia, unspecified: Secondary | ICD-10-CM | POA: Diagnosis not present

## 2013-02-03 DIAGNOSIS — Q842 Other congenital malformations of hair: Secondary | ICD-10-CM | POA: Diagnosis not present

## 2013-02-18 DIAGNOSIS — Z6829 Body mass index (BMI) 29.0-29.9, adult: Secondary | ICD-10-CM | POA: Diagnosis not present

## 2013-02-18 DIAGNOSIS — N390 Urinary tract infection, site not specified: Secondary | ICD-10-CM | POA: Diagnosis not present

## 2013-02-18 DIAGNOSIS — E538 Deficiency of other specified B group vitamins: Secondary | ICD-10-CM | POA: Diagnosis not present

## 2013-03-05 ENCOUNTER — Encounter: Payer: Self-pay | Admitting: Internal Medicine

## 2013-03-05 ENCOUNTER — Ambulatory Visit (INDEPENDENT_AMBULATORY_CARE_PROVIDER_SITE_OTHER): Payer: Medicare Other | Admitting: Internal Medicine

## 2013-03-05 VITALS — BP 118/58 | HR 70 | Ht 61.0 in | Wt 157.0 lb

## 2013-03-05 DIAGNOSIS — R1319 Other dysphagia: Secondary | ICD-10-CM | POA: Diagnosis not present

## 2013-03-05 DIAGNOSIS — R1310 Dysphagia, unspecified: Secondary | ICD-10-CM

## 2013-03-05 DIAGNOSIS — Z9981 Dependence on supplemental oxygen: Secondary | ICD-10-CM

## 2013-03-05 DIAGNOSIS — G4734 Idiopathic sleep related nonobstructive alveolar hypoventilation: Secondary | ICD-10-CM | POA: Insufficient documentation

## 2013-03-05 DIAGNOSIS — K219 Gastro-esophageal reflux disease without esophagitis: Secondary | ICD-10-CM

## 2013-03-05 NOTE — Patient Instructions (Addendum)
You have been scheduled for a Barium Esophogram at [facility] on 03/11/13 at 8:30am. Please arrive 30 minutes prior to your appointment for registration. Make certain not to have anything to eat or drink 6 hours prior to your test. If you need to reschedule for any reason, please contact radiology at [phone] do so. __________________________________________________________________ A barium swallow is an examination that concentrates on views of the esophagus. This tends to be a double contrast exam (barium and two liquids which, when combined, create a gas to distend the wall of the oesophagus) or single contrast (non-ionic iodine based). The study is usually tailored to your symptoms so a good history is essential. Attention is paid during the study to the form, structure and configuration of the esophagus, looking for functional disorders (such as aspiration, dysphagia, achalasia, motility and reflux) EXAMINATION You may be asked to change into a gown, depending on the type of swallow being performed. A radiologist and radiographer will perform the procedure. The radiologist will advise you of the type of contrast selected for your procedure and direct you during the exam. You will be asked to stand, sit or lie in several different positions and to hold a small amount of fluid in your mouth before being asked to swallow while the imaging is performed .In some instances you may be asked to swallow barium coated marshmallows to assess the motility of a solid food bolus. The exam can be recorded as a digital or video fluoroscopy procedure. POST PROCEDURE It will take 1-2 days for the barium to pass through your system. To facilitate this, it is important, unless otherwise directed, to increase your fluids for the next 24-48hrs and to resume your normal diet.  This test typically takes about 30 minutes to  perform. __________________________________________________________________________________  I appreciate the opportunity to care for you.

## 2013-03-05 NOTE — Assessment & Plan Note (Signed)
Recurrent neck sxs, sounds like spasm. Does have GERD and post-nasal gtt Check Ba swallow ? mano ? Change PPI

## 2013-03-05 NOTE — Assessment & Plan Note (Signed)
Stay on PPI ? change

## 2013-03-07 NOTE — Progress Notes (Signed)
  Subjective:    Patient ID: Audrey [name], female    DOB: 08-28-1940, 72 y.o.   MRN: [id number]  HPI The patient is here with complaints of recurrent mild dysphagia but more often has a sense of throat closing intermittently when not eating. Will cough with this. Does have indigestion, is on PPI. Also has sinus drainage/post-nasal drip. Can be hoarse in AM. Is using 2 pillows to elevate herself at night with some help.  Has known GERD and EGD 06/2011 with 2 cm hiatal hernia and fundic gland polyps. 54 Fr Maloney dilation then helped for a while.  Medications, allergies, past medical history, past surgical history, family history and social history are reviewed and updated in the EMR.   Review of Systems Recent UTI Sxs, Tx cipro.    Objective:   Physical Exam WDWN NAD    Assessment & Plan:  Dysphagia, unspecified(787.20) - Plan: DG Esophagus  GERD (gastroesophageal reflux disease)  On supplemental oxygen therapy-nocturnal (Sleep apnea)

## 2013-03-11 ENCOUNTER — Ambulatory Visit (HOSPITAL_COMMUNITY)
Admission: RE | Admit: 2013-03-11 | Discharge: 2013-03-11 | Disposition: A | Discharge status: Home / Self Care | Payer: Medicare Other | Source: Ambulatory Visit | Attending: Internal Medicine | Admitting: Internal Medicine

## 2013-03-11 ENCOUNTER — Other Ambulatory Visit: Payer: Self-pay

## 2013-03-11 DIAGNOSIS — R1310 Dysphagia, unspecified: Secondary | ICD-10-CM | POA: Diagnosis not present

## 2013-03-11 DIAGNOSIS — R079 Chest pain, unspecified: Secondary | ICD-10-CM | POA: Diagnosis not present

## 2013-03-11 DIAGNOSIS — R0789 Other chest pain: Secondary | ICD-10-CM | POA: Diagnosis not present

## 2013-03-11 MED ORDER — PANTOPRAZOLE SODIUM 40 MG PO TBEC: 40.0000 mg | DELAYED_RELEASE_TABLET | Freq: Two times a day (BID) | ORAL | Status: DC

## 2013-03-11 NOTE — Progress Notes (Signed)
Quick Note:  Ba swallow NL  I recommend she try taking pantoprazole 40 mg before breakfast and supper and do that for 2 months and return to see me  Please Rx the bid regimen for her # 60 2 refills ______

## 2013-03-18 DIAGNOSIS — N390 Urinary tract infection, site not specified: Secondary | ICD-10-CM | POA: Diagnosis not present

## 2013-03-23 ENCOUNTER — Encounter: Payer: Self-pay | Admitting: *Deleted

## 2013-03-24 ENCOUNTER — Encounter: Payer: Self-pay | Admitting: Cardiovascular Disease

## 2013-03-25 ENCOUNTER — Ambulatory Visit (INDEPENDENT_AMBULATORY_CARE_PROVIDER_SITE_OTHER): Payer: Medicare Other | Admitting: Cardiovascular Disease

## 2013-03-25 ENCOUNTER — Encounter: Payer: Self-pay | Admitting: Cardiovascular Disease

## 2013-03-25 VITALS — BP 112/78 | HR 77 | Ht 61.0 in | Wt 159.0 lb

## 2013-03-25 DIAGNOSIS — E785 Hyperlipidemia, unspecified: Secondary | ICD-10-CM

## 2013-03-25 DIAGNOSIS — I10 Essential (primary) hypertension: Secondary | ICD-10-CM | POA: Diagnosis not present

## 2013-03-25 NOTE — Progress Notes (Signed)
03/25/2013 Audrey [name]   [birth year]  [id number]  Primary Physician Colette Ribas, MD Primary Cardiologist: Runell Gess MD Roseanne Reno   HPI:  The patient is a very pleasant 72 year old, thin appearing, married Caucasian female, mother of three, grandmother to six grandchildren, whom I last saw in the office six months ago. She has a history of hypertension, hyperlipidemia, and a strong family history for heart disease with a father, who had bypass surgery. She has never had a heart attack or stroke, and denies chest pain or shortness of breath, but does have reflux. She also has Raynaud's phenomenon on low dose calcium channel block, as well as obstructive sleep apnea, not on CPAP but on home O2 at bedtime. She had a Myoview stress test performed on May 19, 2010, which was nonischemic. She has an unfavorable lipid profile, intolerant to statin drugs.    Current Outpatient Prescriptions  Medication Sig Dispense Refill  . alendronate (FOSAMAX) 70 MG tablet Take 70 mg by mouth every 7 (seven) days. Take with a full glass of water on an empty stomach.      . AMBULATORY NON FORMULARY MEDICATION Oxygen At bedtime      . chlordiazePOXIDE (LIBRIUM) 10 MG capsule Take 10 mg by mouth as needed.      . Cholecalciferol (VITAMIN D3) 2000 UNITS TABS Take 2 tablets by mouth daily.      . Coenzyme Q10 200 MG capsule Take 200 mg by mouth daily.      . cyanocobalamin (,VITAMIN B-12,) 1000 MCG/ML injection Inject 1,000 mcg into the muscle every 30 (thirty) days.      Marland Kitchen estradiol (ESTRACE) 0.5 MG tablet Take 0.5 mg by mouth daily.      . furosemide (LASIX) 40 MG tablet 1-2 tablets by mouth once daily      . gabapentin (NEURONTIN) 300 MG capsule Take 300 mg by mouth. One tablet in the morning, one tablet in the afternoon, and 2 tablets in the evening.      Marland Kitchen KRILL OIL PO Take 1 capsule by mouth daily.      Marland Kitchen levothyroxine (SYNTHROID, LEVOTHROID) 50 MCG tablet Take 50 mcg by  mouth daily.      Marland Kitchen losartan-hydrochlorothiazide (HYZAAR) 100-25 MG per tablet Take by mouth daily. 1/2 tablet daily      . meloxicam (MOBIC) 15 MG tablet       . Milnacipran (SAVELLA) 50 MG TABS Take 50 mg by mouth 2 (two) times daily.      . mupirocin ointment (BACTROBAN) 2 %       . pantoprazole (PROTONIX) 40 MG tablet Take 1 tablet (40 mg total) by mouth 2 (two) times daily.  60 tablet  2  . potassium chloride SA (K-DUR,KLOR-CON) 20 MEQ tablet Take 2 tablets by mouth every morning and 2 tablets by mouth every evening       No current facility-administered medications for this visit.    Allergies  Allergen Reactions  . Erythromycin Diarrhea  . Tramadol Nausea And Vomiting and Other (See Comments)    Dizziness   . Bactrim   . Vytorin [Ezetimibe-Simvastatin]   . Adhesive [Tape] Rash  . Statins Other (See Comments)    Makes her hurt bad    History   Social History  . Marital Status: Married    Spouse Name: N/A    Number of Children: 3  . Years of Education: N/A   Occupational History  . retired - Naval architect  [employer]    Social History Main Topics  . Smoking status: Never Smoker   . Smokeless tobacco: Never Used  . Alcohol Use: No  . Drug Use: No  . Sexual Activity: Not on file   Other Topics Concern  . Not on file   Social History Narrative   Married   1 son and 2 daughters, 10 grandchildren   Remains active     Review of Systems: General: negative for chills, fever, night sweats or weight changes.  Cardiovascular: negative for chest pain, dyspnea on exertion, edema, orthopnea, palpitations, paroxysmal nocturnal dyspnea or shortness of breath Dermatological: negative for rash Respiratory: negative for cough or wheezing Urologic: negative for hematuria Abdominal: negative for nausea, vomiting, diarrhea, bright red blood per rectum, melena, or hematemesis Neurologic: negative for visual changes, syncope, or dizziness All other systems reviewed and are otherwise  negative except as noted above.    Blood pressure 112/78, pulse 77, height 5\' 1"  (1.549 m), weight 159 lb (72.122 kg).  General appearance: alert and no distress Neck: no adenopathy, no carotid bruit, no JVD, supple, symmetrical, trachea midline and thyroid not enlarged, symmetric, no tenderness/mass/nodules Lungs: clear to auscultation bilaterally Heart: regular rate and rhythm, S1, S2 normal, no murmur, click, rub or gallop Extremities: extremities normal, atraumatic, no cyanosis or edema  EKG normal sinus rhythm at 77 without ST or T wave changes  ASSESSMENT AND PLAN:   Hyperlipidemia Intolerant to statin drugs  Essential hypertension Well-controlled on current medications      Runell Gess MD [facility], [facility] 03/25/2013 12:16 PM

## 2013-03-25 NOTE — Assessment & Plan Note (Signed)
Well-controlled on current medications 

## 2013-03-25 NOTE — Assessment & Plan Note (Signed)
Intolerant to statin drugs 

## 2013-03-25 NOTE — Patient Instructions (Signed)
Your physician wants you to follow-up in: 1 year with Dr Berry. You will receive a reminder letter in the mail two months in advance. If you don't receive a letter, please call our office to schedule the follow-up appointment.  

## 2013-03-26 ENCOUNTER — Other Ambulatory Visit: Payer: Self-pay

## 2013-04-21 DIAGNOSIS — D518 Other vitamin B12 deficiency anemias: Secondary | ICD-10-CM | POA: Diagnosis not present

## 2013-04-24 DIAGNOSIS — H01029 Squamous blepharitis unspecified eye, unspecified eyelid: Secondary | ICD-10-CM | POA: Diagnosis not present

## 2013-04-24 DIAGNOSIS — Z961 Presence of intraocular lens: Secondary | ICD-10-CM | POA: Diagnosis not present

## 2013-04-24 DIAGNOSIS — H04129 Dry eye syndrome of unspecified lacrimal gland: Secondary | ICD-10-CM | POA: Diagnosis not present

## 2013-04-24 DIAGNOSIS — H1045 Other chronic allergic conjunctivitis: Secondary | ICD-10-CM | POA: Diagnosis not present

## 2013-04-24 DIAGNOSIS — H40019 Open angle with borderline findings, low risk, unspecified eye: Secondary | ICD-10-CM | POA: Diagnosis not present

## 2013-04-28 DIAGNOSIS — R300 Dysuria: Secondary | ICD-10-CM | POA: Diagnosis not present

## 2013-04-28 DIAGNOSIS — R32 Unspecified urinary incontinence: Secondary | ICD-10-CM | POA: Diagnosis not present

## 2013-04-28 DIAGNOSIS — Z79899 Other long term (current) drug therapy: Secondary | ICD-10-CM | POA: Diagnosis not present

## 2013-04-28 DIAGNOSIS — N39498 Other specified urinary incontinence: Secondary | ICD-10-CM | POA: Diagnosis not present

## 2013-04-28 DIAGNOSIS — Z5189 Encounter for other specified aftercare: Secondary | ICD-10-CM | POA: Diagnosis not present

## 2013-04-28 DIAGNOSIS — N390 Urinary tract infection, site not specified: Secondary | ICD-10-CM | POA: Diagnosis not present

## 2013-05-05 DIAGNOSIS — Q842 Other congenital malformations of hair: Secondary | ICD-10-CM | POA: Diagnosis not present

## 2013-05-05 DIAGNOSIS — T83711A Erosion of implanted vaginal mesh and other prosthetic materials to surrounding organ or tissue, initial encounter: Secondary | ICD-10-CM | POA: Insufficient documentation

## 2013-05-08 ENCOUNTER — Other Ambulatory Visit (HOSPITAL_COMMUNITY): Payer: Self-pay | Admitting: Internal Medicine

## 2013-05-08 ENCOUNTER — Encounter: Payer: Self-pay | Admitting: Internal Medicine

## 2013-05-08 ENCOUNTER — Ambulatory Visit (INDEPENDENT_AMBULATORY_CARE_PROVIDER_SITE_OTHER): Payer: Medicare Other | Admitting: Internal Medicine

## 2013-05-08 VITALS — BP 112/68 | HR 88 | Ht 60.75 in | Wt 162.5 lb

## 2013-05-08 DIAGNOSIS — R1319 Other dysphagia: Secondary | ICD-10-CM | POA: Diagnosis not present

## 2013-05-08 DIAGNOSIS — R05 Cough: Secondary | ICD-10-CM | POA: Diagnosis not present

## 2013-05-08 DIAGNOSIS — K219 Gastro-esophageal reflux disease without esophagitis: Secondary | ICD-10-CM | POA: Diagnosis not present

## 2013-05-08 DIAGNOSIS — R1310 Dysphagia, unspecified: Secondary | ICD-10-CM

## 2013-05-08 DIAGNOSIS — R059 Cough, unspecified: Secondary | ICD-10-CM | POA: Insufficient documentation

## 2013-05-08 NOTE — Progress Notes (Signed)
         Subjective:    Patient ID: Audrey [name], female    DOB: 1940/12/08, 72 y.o.   MRN: [id number]  HPI The patient reports she is better on bid vs qd PPI - saying that heartburn is mosty  gone. However still coghs at times with "indigestion triggering" and also coughs when swallowing liquids and saliva fills mouth and throat.  Medications, allergies, past medical history, past surgical history, family history and social history are reviewed and updated in the EMR.   Review of Systems As above    Objective:   Physical Exam Elderly NAD    Assessment & Plan:  DYSPHAGIA - Plan: SLP modified barium swallow, Ambulatory referral to Gastroenterology  GERD  Cough

## 2013-05-08 NOTE — Assessment & Plan Note (Signed)
Better on bid PPI, no reflux/regurgitation Still has indigestion and then coughs - ? Spasm Check MBS and mano

## 2013-05-08 NOTE — Assessment & Plan Note (Signed)
?   Related to GERD, aspiration, spasm

## 2013-05-08 NOTE — Patient Instructions (Addendum)
You have been scheduled for a Barium Esophogram at [facility] (1st floor of the hospital) on 05/26/13 at 1:00pm. Please arrive 15 minutes prior to your appointment for registration. Make certain not to have anything to eat or drink 6 hours prior to your test. If you need to reschedule for any reason, please contact radiology at [phone] to do so. __________________________________________________________________ A barium swallow is an examination that concentrates on views of the esophagus. This tends to be a double contrast exam (barium and two liquids which, when combined, create a gas to distend the wall of the oesophagus) or single contrast (non-ionic iodine based). The study is usually tailored to your symptoms so a good history is essential. Attention is paid during the study to the form, structure and configuration of the esophagus, looking for functional disorders (such as aspiration, dysphagia, achalasia, motility and reflux) EXAMINATION You may be asked to change into a gown, depending on the type of swallow being performed. A radiologist and radiographer will perform the procedure. The radiologist will advise you of the type of contrast selected for your procedure and direct you during the exam. You will be asked to stand, sit or lie in several different positions and to hold a small amount of fluid in your mouth before being asked to swallow while the imaging is performed .In some instances you may be asked to swallow barium coated marshmallows to assess the motility of a solid food bolus. The exam can be recorded as a digital or video fluoroscopy procedure. POST PROCEDURE It will take 1-2 days for the barium to pass through your system. To facilitate this, it is important, unless otherwise directed, to increase your fluids for the next 24-48hrs and to resume your normal diet.  This test typically takes about 30 minutes to  perform. __________________________________________________________________________________ Audrey [name] have been scheduled for an esophageal manometry at [facility] on __________ at ________________. Please arrive 30 minutes prior to your procedure for registration. You will need to go to outpatient registration (1st floor of the hospital) first. Make certain to bring your insurance cards as well as a complete list of medications.  Please remember the following:  1) Nothing to eat or drink after 12:00 midnight on the night before your test.  2) Hold all diabetic medications/insulin the morning of the test. You may eat and take   your medications after the test.  3) For 3 days prior to your test do not take: Dexilant, Prevacid, Nexium, Protonix,   Aciphex, Zegerid, Pantoprazole, Prilosec or omeprazole.  4) For 2 days prior to your test, do not take: Reglan, Tagamet, Zantac, Axid or Pepcid.  5) You MAY use an antacid such as Rolaids or Tums up to 12 hours prior to your test.  It will take at least 2 weeks to receive the results of this test from your physician. ------------------------------------------ ABOUT ESOPHAGEAL MANOMETRY Esophageal manometry (muh-NOM-uh-tree) is a test that gauges how well your esophagus works. Your esophagus is the long, muscular tube that connects your throat to your stomach. Esophageal manometry measures the rhythmic muscle contractions (peristalsis) that occur in your esophagus when you swallow. Esophageal manometry also measures the coordination and force exerted by the muscles of your esophagus.  During esophageal manometry, a thin, flexible tube (catheter) that contains sensors is passed through your nose, down your esophagus and into your stomach. Esophageal manometry can be helpful in diagnosing some mostly uncommon disorders that affect your esophagus.  Why it's done Esophageal manometry is used  to evaluate the movement (motility) of food through the  esophagus and into the stomach. The test measures how well the circular bands of muscle (sphincters) at the top and bottom of your esophagus open and close, as well as the pressure, strength and pattern of the wave of esophageal muscle contractions that moves food along.  What you can expect Esophageal manometry is an outpatient procedure done without sedation. Most people tolerate it well. You may be asked to change into a hospital gown before the test starts.  During esophageal manometry  While you are sitting up, a member of your health care team sprays your throat with a numbing medication or puts numbing gel in your nose or both.  A catheter is guided through your nose into your esophagus. The catheter may be sheathed in a water-filled sleeve. It doesn't interfere with your breathing. However, your eyes may water, and you may gag. You may have a slight nosebleed from irritation.  After the catheter is in place, you may be asked to lie on your back on an exam table, or you may be asked to remain seated.  You then swallow small sips of water. As you do, a computer connected to the catheter records the pressure, strength and pattern of your esophageal muscle contractions.  During the test, you'll be asked to breathe slowly and smoothly, remain as still as possible, and swallow only when you're asked to do so.  A member of your health care team may move the catheter down into your stomach while the catheter continues its measurements.  The catheter then is slowly withdrawn. The test usually lasts 20 to 30 minutes.  After esophageal manometry  When your esophageal manometry is complete, you may return to your normal activities  This test typically takes 30-45 minutes to complete. ________________________________________________________________________________   I appreciate the opportunity to care for you.

## 2013-05-08 NOTE — Assessment & Plan Note (Addendum)
Persists - some coughing w/ liquids Check MBS Check manometry  Now on doxycycline for eye issues - warned about possible esophageal irritation

## 2013-05-25 ENCOUNTER — Encounter (HOSPITAL_COMMUNITY)
Admission: RE | Disposition: A | Discharge status: Home / Self Care | Payer: Self-pay | Source: Ambulatory Visit | Attending: Internal Medicine

## 2013-05-25 ENCOUNTER — Ambulatory Visit (HOSPITAL_COMMUNITY)
Admission: RE | Admit: 2013-05-25 | Discharge: 2013-05-25 | Disposition: A | Discharge status: Home / Self Care | Payer: Medicare Other | Source: Ambulatory Visit | Attending: Internal Medicine | Admitting: Internal Medicine

## 2013-05-25 DIAGNOSIS — R1319 Other dysphagia: Secondary | ICD-10-CM | POA: Diagnosis not present

## 2013-05-25 DIAGNOSIS — K219 Gastro-esophageal reflux disease without esophagitis: Secondary | ICD-10-CM | POA: Insufficient documentation

## 2013-05-25 DIAGNOSIS — R1310 Dysphagia, unspecified: Secondary | ICD-10-CM | POA: Insufficient documentation

## 2013-05-25 HISTORY — PX: ESOPHAGEAL MANOMETRY: SHX5429

## 2013-05-25 SURGERY — MANOMETRY, ESOPHAGUS

## 2013-05-25 MED ORDER — LIDOCAINE VISCOUS 2 % MT SOLN
OROMUCOSAL | Status: AC
  Filled 2013-05-25: qty 15

## 2013-05-25 SURGICAL SUPPLY — 1 items: FACESHIELD LNG OPTICON STERILE (SAFETY) IMPLANT

## 2013-05-26 ENCOUNTER — Ambulatory Visit (HOSPITAL_COMMUNITY)
Admission: RE | Admit: 2013-05-26 | Discharge: 2013-05-26 | Disposition: A | Discharge status: Home / Self Care | Payer: Medicare Other | Source: Ambulatory Visit | Attending: Internal Medicine | Admitting: Internal Medicine

## 2013-05-26 ENCOUNTER — Encounter (HOSPITAL_COMMUNITY): Payer: Self-pay | Admitting: Internal Medicine

## 2013-05-26 DIAGNOSIS — R1310 Dysphagia, unspecified: Secondary | ICD-10-CM

## 2013-05-26 DIAGNOSIS — R1319 Other dysphagia: Secondary | ICD-10-CM | POA: Diagnosis not present

## 2013-05-26 NOTE — Procedures (Addendum)
Objective Swallowing Evaluation: Modified Barium Swallowing Study  Patient Details  Name: Audrey [name] MRN: [id number] Date of Birth: 1941/04/20  Today's Date: 05/26/2013 Time: 5631-4970 SLP Time Calculation (min): 31 min  Past Medical History:  Past Medical History  Diagnosis Date  . Allergic rhinitis   . Anxiety and depression   . Arthritis   . Obesity   . Colon polyp   . Diverticulosis   . HLD (hyperlipidemia)   . HTN (hypertension)   . Hypothyroidism   . Raynaud's syndrome   . Meniere's disease   . Gastric polyps   . GERD (gastroesophageal reflux disease)   . Fibromyalgia   . Sleep apnea   . Right hip pain 08/18/2012  . Family history of heart disease   . History of nuclear stress test 04/2010    nonischemic   . Basal cell carcinoma 2002   Past Surgical History:  Past Surgical History  Procedure Laterality Date  . Abdominal hysterectomy  1982  . Tonsillectomy  1959  . Cataract extraction      bilateral  . Bladder repair  2005    ovaries, tubes removed; pelvic prolapse corrected; rectal repair  . Colonoscopy w/ biopsies and polypectomy  12/20/2009    diverticulosis, adenomatous polyps  . Upper gastrointestinal endoscopy  12/20/2009    GERD  . Foot surgery  1999    bilateral  . Basal cell carcinoma excision  03-31-01    face  . Lipoma excision      right arm  . Bladder surgery  01-2009    with mesh placement  . Laser surgery bladder  05-25-10    on mesh  . Nasolacrimal duct probing  09-12-10    right eye  . Nasolacrimal duct probing  09-26-10    left eye  . Removal of bladder mesh  03-01-11    [facility]  . Interstim implant placement  01/2012    [facility]  . Esophagogastroduodenoscopy (egd) with esophageal dilation  06/09/2012    Procedure: ESOPHAGOGASTRODUODENOSCOPY (EGD) WITH ESOPHAGEAL DILATION;  Surgeon: Gatha Mayer, MD;  Location: [facility];  Service: Endoscopy;  Laterality: N/A;  . Transthoracic echocardiogram  12/2006    EF=>55%; LA mild-mod dilated; RA mildly  dilated; mild mitral annular calcif, mild MR; mod TR & elevated RVSP; mild pulm vavle regurg  . Esophageal manometry N/A 05/25/2013    Procedure: ESOPHAGEAL MANOMETRY (EM);  Surgeon: Gatha Mayer, MD;  Location: [facility];  Service: Endoscopy;  Laterality: N/A;   HPI:  73 yo female referred by Dr Carlean Purl for MBS due to pt complaint of dysphagia.  PMH + for hypertension, hyperlipidemia, reflux.  During GI visit 03/11/13, MD recommended pt take panoprazole 40 mg before breakfast and supper for 2 months.   Pt reports improvement in symptoms since taking her PPI.  She also has Raynaud's phenomenon on low dose calcium channel block, as well as obstructive sleep apnea, not on CPAP but on home O2 at bedtime.   Pt underwent esophageal manometry yesterday but results not posted yet.  Symptoms described by pt include issues with indigestion and coughing causing pain in throat.  Cough noted at times prior to swallowing if pt gets food/drink "too far back" and after swallowing if pt belches and senses reflux.  Pt also states she feels food or liquids sliding down her esophagus- pointing to proximal esophagus.  Pt without neuro hx but has marked decreased facial/labial movement on left - she is uncertain how long this has been present.  Assessment / Plan / Recommendation Clinical Impression  Dysphagia Diagnosis: Mild oral phase dysphagia  Clinical impression: Pt presents with minimal oropharyngeal dysphagia.  Audible aspiration of thin present when pt was attempting to swallow barium tablet- due to premature spillage of liquids into trachea.   Strong cough response observed with aspiration- which is consistent with some dysphagia symptoms pt verbalized.  Pt expectorated barium tablet per SLP command and swallowed adequately with pudding.    Pt demonstrated minimal pharyngeal residuals of liquids x1 during testing which adequately cleared with reflexive swallow.   Pharyngeal swallow was strong with  solids/pudding with no stasis.    Xerostomia reported by pt for which mitigation strategies given.  SLP educated pt to findings and compensation strategies to decrease coughing episodes from oropharyngeal deficits - eg: avoiding mixed consistencies,  taking medicine with applesauce- follow w/water, etc.    Pt's symptoms of belching and sensing reflux to pharynx causing cough does not appear consistent with oropharyngeal deficits, therefore suspect multifactorial deficits present.   All educated completed with pt.  Thanks for this referral.      Treatment Recommendation    No further SLP   Diet Recommendation Regular;Thin liquid   Liquid Administration via: Cup;Straw Medication Administration: Whole meds with puree (start and follow with water) Supervision: Patient able to self feed Compensations: Slow rate;Small sips/bites Postural Changes and/or Swallow Maneuvers: Seated upright 90 degrees;Upright 30-60 min after meal (eat several small meals)    Other  Recommendations Oral Care Recommendations: Oral care BID   Follow Up Recommendations  None         General Date of Onset: 05/26/13 HPI: 73 yo female referred by Dr Carlean Purl for MBS due to pt complaint of dysphagia.  PMH + for hypertension, hyperlipidemia, reflux.  During GI visit 03/11/13, MD recommended pt take panoprazole 40 mg before breakfast and supper for 2 months.   Pt reports improvement in symptoms since taking her PPI.  She also has Raynaud's phenomenon on low dose calcium channel block, as well as obstructive sleep apnea, not on CPAP but on home O2 at bedtime.   Pt underwent manometry yesterday but results not posted yet.  Symptoms described by pt include issues with indigestion and coughing causing pain in throat.  Cough noted at times prior to swallowing if pt gets food/drink "too far back" and after swallowing if pt belches and senses reflux.  Pt also states she feels food or liquids sliding down her esophagus- pointing to  proximal esophagus.  Pt without neuro hx but has marked decreased facial/labial movement on left - she is uncertain how long this has been present.   Type of Study: Modified Barium Swallowing Study Reason for Referral: Objectively evaluate swallowing function Previous Swallow Assessment: esophagram 03/11/13 normal, esophagram 09/17/03 slight gastroesophageal reflux, mild distal esophagitis, otherwise negative Diet Prior to this Study: Regular;Thin liquids Respiratory Status: Room air Behavior/Cognition: Alert;Cooperative;Pleasant mood Oral Cavity - Dentition: Adequate natural dentition Oral Motor / Sensory Function: Impaired sensory;Impaired motor (decreased facial/labial movement on left side, also sensation different on left, pt denies neuro hx) Self-Feeding Abilities: Able to feed self Patient Positioning: Upright in bed Baseline Vocal Quality: Clear Volitional Cough: Strong Volitional Swallow: Able to elicit Anatomy: Within functional limits    Reason for Referral Objectively evaluate swallowing function   Oral Phase Oral Preparation/Oral Phase Oral Phase: Impaired Oral - Nectar Oral - Nectar Cup: Within functional limits Oral - Thin Oral - Thin Cup: Within functional limits Oral - Thin Straw: Within functional  limits (single episode of premature spillage into pharynx) Oral - Solids Oral - Puree: Within functional limits Oral - Regular: Within functional limits Oral - Pill:  (pt needed pudding to aid oral transit of pill) Oral Phase - Comment Oral Phase - Comment: discoordination when taking barium tablet with liquid requiring pt to expectorate it, pt able to adequately transit and swallow pill with pudding/applesauce   Pharyngeal Phase Pharyngeal - Nectar Pharyngeal - Nectar Cup: Within functional limits Pharyngeal - Thin Pharyngeal - Thin Cup: Within functional limits Pharyngeal - Thin Straw: Pharyngeal residue - valleculae;Pharyngeal residue - pyriform  sinuses;Penetration/Aspiration before swallow (mild amount of pharyngeal residuals cleared with reflexive dry swallow) Penetration/Aspiration details (thin straw): Material enters airway, remains ABOVE vocal cords then ejected out Pharyngeal - Solids Pharyngeal - Puree: Within functional limits Pharyngeal - Regular: Within functional limits Pharyngeal - Pill: Within functional limits (aspiration of thin when trying to swallow pill)  Cervical Esophageal Phase    GO    Cervical Esophageal Phase Cervical Esophageal Phase: Impaired Cervical Esophageal Phase - Comment Cervical Esophageal Comment: Pt reported slow clearance x1 pointing to proximal esophagus with pudding - appearance of mild amount of barium in said area was adequately cleared with liquid swallow, radiologist not present- please refer to previous esophagrams and manometry    Functional Assessment Tool Used: MBS, clinical judgement Functional Limitations: Swallowing Swallow Current Status [id number]): At least 1 percent but less than 20 percent impaired, limited or restricted Swallow Goal Status [phone]): At least 1 percent but less than 20 percent impaired, limited or restricted Swallow Discharge Status [phone]): At least 1 percent but less than 20 percent impaired, limited or restricted   Luanna Salk, [facility] SLP [phone]

## 2013-05-27 DIAGNOSIS — E538 Deficiency of other specified B group vitamins: Secondary | ICD-10-CM | POA: Diagnosis not present

## 2013-05-27 DIAGNOSIS — J309 Allergic rhinitis, unspecified: Secondary | ICD-10-CM | POA: Diagnosis not present

## 2013-05-27 DIAGNOSIS — I10 Essential (primary) hypertension: Secondary | ICD-10-CM | POA: Diagnosis not present

## 2013-05-27 DIAGNOSIS — R7301 Impaired fasting glucose: Secondary | ICD-10-CM | POA: Diagnosis not present

## 2013-05-27 DIAGNOSIS — Z6829 Body mass index (BMI) 29.0-29.9, adult: Secondary | ICD-10-CM | POA: Diagnosis not present

## 2013-05-27 DIAGNOSIS — E039 Hypothyroidism, unspecified: Secondary | ICD-10-CM | POA: Diagnosis not present

## 2013-05-27 DIAGNOSIS — J0100 Acute maxillary sinusitis, unspecified: Secondary | ICD-10-CM | POA: Diagnosis not present

## 2013-05-28 NOTE — Progress Notes (Signed)
Quick Note:  Let her know that the modified barium swallow test revealed what I think the cause of her problems are in she should follow the speech pathologist recommendations. The esophageal manometry was normal. Followup as needed. ______

## 2013-05-29 NOTE — Op Note (Signed)
ESOPHAGEAL MANOMETRY   Normal study  See full scanned report also  Summary    Basal Pressures* LES, respiratory mean(mmHg) 21.8 (13-43) UES mean(mmHg) 65.7 (34-104)  Anatomy* LES proximal(cm) 37.9 LES intraabdominal(cm) 0.0 Esophageal length(cm) 21.1 Hiatal hernia Yes, 2.9   Residual Pressures* LES (mean)(mmHg) 5.0 (<15.0) UES (mean)(mmHg) 3.6 (<12.0)    Lower Esophageal Sphincter Region  Normal Esophageal Motility  Normal  Landmarks   Number of swallows evaluated 10       Proximal LES (from nares)(cm) 37.9  High Resolution Parameters        LES length(cm) 3.2 2.7-4.8     Distal contractile integral(mean)(mmHg-cm-s) 2974.8 [phone]      Esophageal length (LES-UES centers)(cm) 21.1      Distal contractile integral(highest)(mmHg-cm-s) 5418.7       Intraabdominal LES length(cm) 0.0      Contractile front velocity(cm/s) 3.4 <9.0      Hiatal hernia? Yes, 2.9  [place] Classification    LES Pressures       Distal latency 6.0       Pressure meas. method eSleeve,IRP      % failed ([place] Classification) 0       Basal (respiratory min.)(mmHg) 16.1 4.8-32.0     % panesophageal pressurization 0       Basal (respiratory mean)(mmHg) 21.8 13-43     % premature contraction 0       Residual (mean)(mmHg) 5.0 <15.0     % rapid contraction 0          % large breaks 0          % small breaks 0      Impedance analysis           Incomplete bolus clearance(%) 10          Upper Esophageal Sphincter  Normal Pharyngeal / UES Motility  Normal  Mean basal pressure(mmHg) 65.7 34-104 No. swallows evaluated 10   Mean residual pressure(mmHg) 3.6 <12.0 Evaluated @ 3.0 & N/A above UES           Mean peak pressure(mmHg) 8.8

## 2013-06-02 DIAGNOSIS — H04129 Dry eye syndrome of unspecified lacrimal gland: Secondary | ICD-10-CM | POA: Diagnosis not present

## 2013-06-02 DIAGNOSIS — H40019 Open angle with borderline findings, low risk, unspecified eye: Secondary | ICD-10-CM | POA: Diagnosis not present

## 2013-06-02 DIAGNOSIS — H01029 Squamous blepharitis unspecified eye, unspecified eyelid: Secondary | ICD-10-CM | POA: Diagnosis not present

## 2013-06-02 DIAGNOSIS — Z961 Presence of intraocular lens: Secondary | ICD-10-CM | POA: Diagnosis not present

## 2013-06-02 DIAGNOSIS — H43819 Vitreous degeneration, unspecified eye: Secondary | ICD-10-CM | POA: Diagnosis not present

## 2013-06-02 DIAGNOSIS — H1045 Other chronic allergic conjunctivitis: Secondary | ICD-10-CM | POA: Diagnosis not present

## 2013-06-05 DIAGNOSIS — J3489 Other specified disorders of nose and nasal sinuses: Secondary | ICD-10-CM | POA: Diagnosis not present

## 2013-06-05 DIAGNOSIS — Z9981 Dependence on supplemental oxygen: Secondary | ICD-10-CM | POA: Diagnosis not present

## 2013-06-30 DIAGNOSIS — M549 Dorsalgia, unspecified: Secondary | ICD-10-CM | POA: Diagnosis not present

## 2013-06-30 DIAGNOSIS — E663 Overweight: Secondary | ICD-10-CM | POA: Diagnosis not present

## 2013-06-30 DIAGNOSIS — M542 Cervicalgia: Secondary | ICD-10-CM | POA: Diagnosis not present

## 2013-07-02 DIAGNOSIS — I872 Venous insufficiency (chronic) (peripheral): Secondary | ICD-10-CM | POA: Diagnosis not present

## 2013-07-02 DIAGNOSIS — I8310 Varicose veins of unspecified lower extremity with inflammation: Secondary | ICD-10-CM | POA: Diagnosis not present

## 2013-07-02 DIAGNOSIS — D518 Other vitamin B12 deficiency anemias: Secondary | ICD-10-CM | POA: Diagnosis not present

## 2013-07-08 DIAGNOSIS — Z1231 Encounter for screening mammogram for malignant neoplasm of breast: Secondary | ICD-10-CM | POA: Diagnosis not present

## 2013-08-14 DIAGNOSIS — E538 Deficiency of other specified B group vitamins: Secondary | ICD-10-CM | POA: Diagnosis not present

## 2013-08-25 DIAGNOSIS — Z85828 Personal history of other malignant neoplasm of skin: Secondary | ICD-10-CM | POA: Diagnosis not present

## 2013-08-25 DIAGNOSIS — I872 Venous insufficiency (chronic) (peripheral): Secondary | ICD-10-CM | POA: Diagnosis not present

## 2013-08-25 DIAGNOSIS — L570 Actinic keratosis: Secondary | ICD-10-CM | POA: Diagnosis not present

## 2013-08-25 DIAGNOSIS — D235 Other benign neoplasm of skin of trunk: Secondary | ICD-10-CM | POA: Diagnosis not present

## 2013-08-25 DIAGNOSIS — I8310 Varicose veins of unspecified lower extremity with inflammation: Secondary | ICD-10-CM | POA: Diagnosis not present

## 2013-08-25 DIAGNOSIS — L821 Other seborrheic keratosis: Secondary | ICD-10-CM | POA: Diagnosis not present

## 2013-08-25 DIAGNOSIS — D1801 Hemangioma of skin and subcutaneous tissue: Secondary | ICD-10-CM | POA: Diagnosis not present

## 2013-09-01 DIAGNOSIS — N952 Postmenopausal atrophic vaginitis: Secondary | ICD-10-CM | POA: Diagnosis not present

## 2013-09-01 DIAGNOSIS — N318 Other neuromuscular dysfunction of bladder: Secondary | ICD-10-CM | POA: Diagnosis not present

## 2013-09-01 DIAGNOSIS — N390 Urinary tract infection, site not specified: Secondary | ICD-10-CM | POA: Diagnosis not present

## 2013-09-01 DIAGNOSIS — N3941 Urge incontinence: Secondary | ICD-10-CM | POA: Diagnosis not present

## 2013-09-01 DIAGNOSIS — T83711A Erosion of implanted vaginal mesh and other prosthetic materials to surrounding organ or tissue, initial encounter: Secondary | ICD-10-CM | POA: Diagnosis not present

## 2013-09-01 DIAGNOSIS — R159 Full incontinence of feces: Secondary | ICD-10-CM | POA: Diagnosis not present

## 2013-09-07 ENCOUNTER — Other Ambulatory Visit: Payer: Self-pay

## 2013-09-07 MED ORDER — PANTOPRAZOLE SODIUM 40 MG PO TBEC: 40.0000 mg | DELAYED_RELEASE_TABLET | Freq: Two times a day (BID) | ORAL | Status: DC

## 2013-09-11 DIAGNOSIS — M79609 Pain in unspecified limb: Secondary | ICD-10-CM | POA: Diagnosis not present

## 2013-09-16 DIAGNOSIS — J069 Acute upper respiratory infection, unspecified: Secondary | ICD-10-CM | POA: Diagnosis not present

## 2013-09-16 DIAGNOSIS — Z6831 Body mass index (BMI) 31.0-31.9, adult: Secondary | ICD-10-CM | POA: Diagnosis not present

## 2013-09-16 DIAGNOSIS — E538 Deficiency of other specified B group vitamins: Secondary | ICD-10-CM | POA: Diagnosis not present

## 2013-10-23 DIAGNOSIS — H6120 Impacted cerumen, unspecified ear: Secondary | ICD-10-CM | POA: Diagnosis not present

## 2013-10-23 DIAGNOSIS — Z6830 Body mass index (BMI) 30.0-30.9, adult: Secondary | ICD-10-CM | POA: Diagnosis not present

## 2013-10-23 DIAGNOSIS — H6500 Acute serous otitis media, unspecified ear: Secondary | ICD-10-CM | POA: Diagnosis not present

## 2013-10-23 DIAGNOSIS — E538 Deficiency of other specified B group vitamins: Secondary | ICD-10-CM | POA: Diagnosis not present

## 2013-11-06 DIAGNOSIS — Z6830 Body mass index (BMI) 30.0-30.9, adult: Secondary | ICD-10-CM | POA: Diagnosis not present

## 2013-11-06 DIAGNOSIS — N390 Urinary tract infection, site not specified: Secondary | ICD-10-CM | POA: Diagnosis not present

## 2013-11-06 DIAGNOSIS — H6980 Other specified disorders of Eustachian tube, unspecified ear: Secondary | ICD-10-CM | POA: Diagnosis not present

## 2013-11-12 DIAGNOSIS — N390 Urinary tract infection, site not specified: Secondary | ICD-10-CM | POA: Diagnosis not present

## 2013-11-12 DIAGNOSIS — E538 Deficiency of other specified B group vitamins: Secondary | ICD-10-CM | POA: Diagnosis not present

## 2013-12-18 DIAGNOSIS — I739 Peripheral vascular disease, unspecified: Secondary | ICD-10-CM | POA: Diagnosis not present

## 2013-12-18 DIAGNOSIS — B351 Tinea unguium: Secondary | ICD-10-CM | POA: Diagnosis not present

## 2013-12-18 DIAGNOSIS — M2040 Other hammer toe(s) (acquired), unspecified foot: Secondary | ICD-10-CM | POA: Diagnosis not present

## 2013-12-18 DIAGNOSIS — Q828 Other specified congenital malformations of skin: Secondary | ICD-10-CM | POA: Diagnosis not present

## 2013-12-21 ENCOUNTER — Other Ambulatory Visit (HOSPITAL_COMMUNITY): Payer: Self-pay | Admitting: Podiatry

## 2013-12-21 DIAGNOSIS — R0989 Other specified symptoms and signs involving the circulatory and respiratory systems: Secondary | ICD-10-CM

## 2013-12-21 DIAGNOSIS — M79605 Pain in left leg: Secondary | ICD-10-CM

## 2013-12-21 DIAGNOSIS — M79604 Pain in right leg: Secondary | ICD-10-CM

## 2013-12-24 ENCOUNTER — Ambulatory Visit (HOSPITAL_COMMUNITY)
Admission: RE | Admit: 2013-12-24 | Discharge: 2013-12-24 | Disposition: A | Discharge status: Home / Self Care | Payer: Medicare Other | Source: Ambulatory Visit | Attending: Podiatry | Admitting: Podiatry

## 2013-12-24 DIAGNOSIS — R0989 Other specified symptoms and signs involving the circulatory and respiratory systems: Secondary | ICD-10-CM

## 2013-12-24 DIAGNOSIS — M79605 Pain in left leg: Secondary | ICD-10-CM

## 2013-12-24 DIAGNOSIS — M79609 Pain in unspecified limb: Secondary | ICD-10-CM | POA: Diagnosis not present

## 2013-12-24 DIAGNOSIS — M79604 Pain in right leg: Secondary | ICD-10-CM

## 2014-01-05 DIAGNOSIS — M79609 Pain in unspecified limb: Secondary | ICD-10-CM | POA: Diagnosis not present

## 2014-01-05 DIAGNOSIS — I7300 Raynaud's syndrome without gangrene: Secondary | ICD-10-CM | POA: Diagnosis not present

## 2014-01-08 DIAGNOSIS — H903 Sensorineural hearing loss, bilateral: Secondary | ICD-10-CM | POA: Diagnosis not present

## 2014-01-08 DIAGNOSIS — H9190 Unspecified hearing loss, unspecified ear: Secondary | ICD-10-CM | POA: Diagnosis not present

## 2014-01-08 DIAGNOSIS — H8110 Benign paroxysmal vertigo, unspecified ear: Secondary | ICD-10-CM | POA: Diagnosis not present

## 2014-01-08 DIAGNOSIS — H93299 Other abnormal auditory perceptions, unspecified ear: Secondary | ICD-10-CM | POA: Diagnosis not present

## 2014-01-08 DIAGNOSIS — H905 Unspecified sensorineural hearing loss: Secondary | ICD-10-CM | POA: Diagnosis not present

## 2014-01-08 DIAGNOSIS — J387 Other diseases of larynx: Secondary | ICD-10-CM | POA: Diagnosis not present

## 2014-01-12 ENCOUNTER — Other Ambulatory Visit: Payer: Self-pay | Admitting: Otolaryngology

## 2014-01-12 DIAGNOSIS — H9191 Unspecified hearing loss, right ear: Secondary | ICD-10-CM

## 2014-01-22 DIAGNOSIS — D518 Other vitamin B12 deficiency anemias: Secondary | ICD-10-CM | POA: Diagnosis not present

## 2014-01-22 DIAGNOSIS — R300 Dysuria: Secondary | ICD-10-CM | POA: Diagnosis not present

## 2014-01-22 DIAGNOSIS — N390 Urinary tract infection, site not specified: Secondary | ICD-10-CM | POA: Diagnosis not present

## 2014-01-26 DIAGNOSIS — H905 Unspecified sensorineural hearing loss: Secondary | ICD-10-CM | POA: Diagnosis not present

## 2014-01-26 DIAGNOSIS — H903 Sensorineural hearing loss, bilateral: Secondary | ICD-10-CM | POA: Diagnosis not present

## 2014-02-01 ENCOUNTER — Other Ambulatory Visit: Payer: Self-pay | Admitting: Otolaryngology

## 2014-02-02 ENCOUNTER — Other Ambulatory Visit: Payer: Self-pay | Admitting: Otolaryngology

## 2014-02-02 ENCOUNTER — Ambulatory Visit
Admission: RE | Admit: 2014-02-02 | Discharge: 2014-02-02 | Disposition: A | Discharge status: Home / Self Care | Payer: Medicare Other | Source: Ambulatory Visit | Attending: Otolaryngology | Admitting: Otolaryngology

## 2014-02-02 DIAGNOSIS — H905 Unspecified sensorineural hearing loss: Secondary | ICD-10-CM

## 2014-02-02 DIAGNOSIS — H903 Sensorineural hearing loss, bilateral: Secondary | ICD-10-CM

## 2014-02-16 DIAGNOSIS — M79609 Pain in unspecified limb: Secondary | ICD-10-CM | POA: Diagnosis not present

## 2014-02-16 DIAGNOSIS — G609 Hereditary and idiopathic neuropathy, unspecified: Secondary | ICD-10-CM | POA: Diagnosis not present

## 2014-02-16 DIAGNOSIS — I7300 Raynaud's syndrome without gangrene: Secondary | ICD-10-CM | POA: Diagnosis not present

## 2014-02-17 DIAGNOSIS — E538 Deficiency of other specified B group vitamins: Secondary | ICD-10-CM | POA: Diagnosis not present

## 2014-02-17 DIAGNOSIS — Z23 Encounter for immunization: Secondary | ICD-10-CM | POA: Diagnosis not present

## 2014-03-08 DIAGNOSIS — Z6831 Body mass index (BMI) 31.0-31.9, adult: Secondary | ICD-10-CM | POA: Diagnosis not present

## 2014-03-08 DIAGNOSIS — M797 Fibromyalgia: Secondary | ICD-10-CM | POA: Diagnosis not present

## 2014-03-08 DIAGNOSIS — M1991 Primary osteoarthritis, unspecified site: Secondary | ICD-10-CM | POA: Diagnosis not present

## 2014-03-08 DIAGNOSIS — N342 Other urethritis: Secondary | ICD-10-CM | POA: Diagnosis not present

## 2014-03-08 DIAGNOSIS — E6609 Other obesity due to excess calories: Secondary | ICD-10-CM | POA: Diagnosis not present

## 2014-03-24 DIAGNOSIS — H905 Unspecified sensorineural hearing loss: Secondary | ICD-10-CM | POA: Diagnosis not present

## 2014-03-24 DIAGNOSIS — R42 Dizziness and giddiness: Secondary | ICD-10-CM | POA: Diagnosis not present

## 2014-03-31 DIAGNOSIS — Z23 Encounter for immunization: Secondary | ICD-10-CM | POA: Diagnosis not present

## 2014-03-31 DIAGNOSIS — H6523 Chronic serous otitis media, bilateral: Secondary | ICD-10-CM | POA: Diagnosis not present

## 2014-03-31 DIAGNOSIS — D519 Vitamin B12 deficiency anemia, unspecified: Secondary | ICD-10-CM | POA: Diagnosis not present

## 2014-03-31 DIAGNOSIS — Z6830 Body mass index (BMI) 30.0-30.9, adult: Secondary | ICD-10-CM | POA: Diagnosis not present

## 2014-04-06 DIAGNOSIS — Z6830 Body mass index (BMI) 30.0-30.9, adult: Secondary | ICD-10-CM | POA: Diagnosis not present

## 2014-04-06 DIAGNOSIS — L853 Xerosis cutis: Secondary | ICD-10-CM | POA: Diagnosis not present

## 2014-04-06 DIAGNOSIS — N342 Other urethritis: Secondary | ICD-10-CM | POA: Diagnosis not present

## 2014-04-13 DIAGNOSIS — M79671 Pain in right foot: Secondary | ICD-10-CM | POA: Diagnosis not present

## 2014-04-13 DIAGNOSIS — M2040 Other hammer toe(s) (acquired), unspecified foot: Secondary | ICD-10-CM | POA: Diagnosis not present

## 2014-04-13 DIAGNOSIS — Q828 Other specified congenital malformations of skin: Secondary | ICD-10-CM | POA: Diagnosis not present

## 2014-04-13 DIAGNOSIS — B351 Tinea unguium: Secondary | ICD-10-CM | POA: Diagnosis not present

## 2014-04-28 ENCOUNTER — Other Ambulatory Visit: Payer: Self-pay | Admitting: Internal Medicine

## 2014-05-05 DIAGNOSIS — M25571 Pain in right ankle and joints of right foot: Secondary | ICD-10-CM | POA: Diagnosis not present

## 2014-05-05 DIAGNOSIS — M25572 Pain in left ankle and joints of left foot: Secondary | ICD-10-CM | POA: Diagnosis not present

## 2014-05-06 DIAGNOSIS — D510 Vitamin B12 deficiency anemia due to intrinsic factor deficiency: Secondary | ICD-10-CM | POA: Diagnosis not present

## 2014-06-03 DIAGNOSIS — M205X1 Other deformities of toe(s) (acquired), right foot: Secondary | ICD-10-CM | POA: Diagnosis not present

## 2014-06-03 DIAGNOSIS — M2042 Other hammer toe(s) (acquired), left foot: Secondary | ICD-10-CM | POA: Diagnosis not present

## 2014-06-03 DIAGNOSIS — M205X2 Other deformities of toe(s) (acquired), left foot: Secondary | ICD-10-CM | POA: Diagnosis not present

## 2014-06-07 DIAGNOSIS — Z6830 Body mass index (BMI) 30.0-30.9, adult: Secondary | ICD-10-CM | POA: Diagnosis not present

## 2014-06-07 DIAGNOSIS — E6609 Other obesity due to excess calories: Secondary | ICD-10-CM | POA: Diagnosis not present

## 2014-06-07 DIAGNOSIS — M545 Low back pain: Secondary | ICD-10-CM | POA: Diagnosis not present

## 2014-06-08 DIAGNOSIS — M79671 Pain in right foot: Secondary | ICD-10-CM | POA: Diagnosis not present

## 2014-06-08 DIAGNOSIS — Q828 Other specified congenital malformations of skin: Secondary | ICD-10-CM | POA: Diagnosis not present

## 2014-06-08 DIAGNOSIS — B351 Tinea unguium: Secondary | ICD-10-CM | POA: Diagnosis not present

## 2014-06-08 DIAGNOSIS — M2040 Other hammer toe(s) (acquired), unspecified foot: Secondary | ICD-10-CM | POA: Diagnosis not present

## 2014-06-18 DIAGNOSIS — M2560 Stiffness of unspecified joint, not elsewhere classified: Secondary | ICD-10-CM | POA: Diagnosis not present

## 2014-06-18 DIAGNOSIS — M6281 Muscle weakness (generalized): Secondary | ICD-10-CM | POA: Diagnosis not present

## 2014-06-18 DIAGNOSIS — R293 Abnormal posture: Secondary | ICD-10-CM | POA: Diagnosis not present

## 2014-06-18 DIAGNOSIS — M545 Low back pain: Secondary | ICD-10-CM | POA: Diagnosis not present

## 2014-06-22 ENCOUNTER — Encounter: Payer: Self-pay | Admitting: Cardiovascular Disease

## 2014-06-22 ENCOUNTER — Ambulatory Visit (INDEPENDENT_AMBULATORY_CARE_PROVIDER_SITE_OTHER): Payer: Medicare Other | Admitting: Cardiovascular Disease

## 2014-06-22 VITALS — BP 132/80 | HR 90 | Ht 61.0 in | Wt 164.0 lb

## 2014-06-22 DIAGNOSIS — I10 Essential (primary) hypertension: Secondary | ICD-10-CM

## 2014-06-22 DIAGNOSIS — E785 Hyperlipidemia, unspecified: Secondary | ICD-10-CM | POA: Diagnosis not present

## 2014-06-22 NOTE — Progress Notes (Signed)
06/22/2014 Audrey [name]   10-29-1940  [id number]  Primary Physician Purvis Kilts, MD Primary Cardiologist: Lorretta Harp MD Renae Gloss   HPI:  The patient is a very pleasant 74 year old, thin appearing, married Caucasian female, mother of three, grandmother to six grandchildren, whom I last saw in the office 12 months ago. She has a history of hypertension, hyperlipidemia, and a strong family history for heart disease with a father, who had bypass surgery. She has never had a heart attack or stroke, and denies chest pain or shortness of breath, but does have reflux. She also has Raynaud's phenomenon on low dose calcium channel block in the past but not currently, as well as obstructive sleep apnea, not on CPAP but on home O2 at bedtime. She had a Myoview stress test performed on May 19, 2010, which was nonischemic. She has an unfavorable lipid profile, intolerant to statin drugs.   Current Outpatient Prescriptions  Medication Sig Dispense Refill  . alendronate (FOSAMAX) 70 MG tablet Take 70 mg by mouth every 7 (seven) days. Take with a full glass of water on an empty stomach.    . AMBULATORY NON FORMULARY MEDICATION Oxygen @ 2LMP At bedtime    . carboxymethylcellulose (REFRESH TEARS) 0.5 % SOLN Place 1 drop into both eyes every 3 (three) hours.    . chlordiazePOXIDE (LIBRIUM) 10 MG capsule Take 10 mg by mouth as needed.    . Cholecalciferol (VITAMIN D3) 2000 UNITS TABS Take 2 tablets by mouth daily.    . Coenzyme Q10 200 MG capsule Take 200 mg by mouth daily.    . cyanocobalamin (,VITAMIN B-12,) 1000 MCG/ML injection Inject 1,000 mcg into the muscle every 30 (thirty) days.    Marland Kitchen doxycycline (VIBRAMYCIN) 50 MG capsule Take 50 mg by mouth daily.    Marland Kitchen erythromycin ophthalmic ointment Place 1 application into both eyes every morning.    . fluocinonide cream (LIDEX) 1.74 % Apply 1 application topically 2 (two) times daily. 2 weeks of using cream; 2 weeks of not using  cream    . furosemide (LASIX) 40 MG tablet 1-2 tablets by mouth once daily    . gabapentin (NEURONTIN) 300 MG capsule Take 300 mg by mouth. One tablet in the morning, one tablet in the afternoon, and 2 tablets in the evening.    Marland Kitchen ketotifen (ALAWAY) 0.025 % ophthalmic solution Place 1 drop into both eyes 2 (two) times daily.    Marland Kitchen KRILL OIL PO Take 1 capsule by mouth daily.    Marland Kitchen levothyroxine (SYNTHROID, LEVOTHROID) 50 MCG tablet Take 50 mcg by mouth daily.    Marland Kitchen losartan-hydrochlorothiazide (HYZAAR) 100-25 MG per tablet Take by mouth daily. 1/2 tablet daily    . meloxicam (MOBIC) 15 MG tablet     . Milnacipran (SAVELLA) 50 MG TABS Take 50 mg by mouth 2 (two) times daily.    . pantoprazole (PROTONIX) 40 MG tablet TAKE 1 TABLET BEFORE BREAKFAST AND BEFORE SUPPER. 60 tablet 1  . potassium chloride SA (K-DUR,KLOR-CON) 20 MEQ tablet Take 2 tablets by mouth every morning and 2 tablets by mouth every evening     No current facility-administered medications for this visit.    Allergies  Allergen Reactions  . Erythromycin Diarrhea  . Tramadol Nausea And Vomiting and Other (See Comments)    Dizziness   . Bactrim   . Vytorin [Ezetimibe-Simvastatin]   . Adhesive [Tape] Rash  . Statins Other (See Comments)    Makes her hurt  bad    History   Social History  . Marital Status: Married    Spouse Name: N/A    Number of Children: 3  . Years of Education: N/A   Occupational History  . retired - [employer]    Social History Main Topics  . Smoking status: Never Smoker   . Smokeless tobacco: Never Used  . Alcohol Use: No  . Drug Use: No  . Sexual Activity: Not on file   Other Topics Concern  . Not on file   Social History Narrative   Married   1 son and 2 daughters, 10 grandchildren   Remains active     Review of Systems: General: negative for chills, fever, night sweats or weight changes.  Cardiovascular: negative for chest pain, dyspnea on exertion, edema, orthopnea,  palpitations, paroxysmal nocturnal dyspnea or shortness of breath Dermatological: negative for rash Respiratory: negative for cough or wheezing Urologic: negative for hematuria Abdominal: negative for nausea, vomiting, diarrhea, bright red blood per rectum, melena, or hematemesis Neurologic: negative for visual changes, syncope, or dizziness All other systems reviewed and are otherwise negative except as noted above.    Blood pressure 132/80, pulse 90, height 5\' 1"  (1.549 m), weight 164 lb (74.39 kg).  General appearance: alert and no distress Neck: no adenopathy, no carotid bruit, no JVD, supple, symmetrical, trachea midline and thyroid not enlarged, symmetric, no tenderness/mass/nodules Lungs: clear to auscultation bilaterally Heart: regular rate and rhythm, S1, S2 normal, no murmur, click, rub or gallop Extremities: extremities normal, atraumatic, no cyanosis or edema and venous stasis changes  EKG sinus rhythm at 90 with septal Q waves and low limb voltage. I personally reviewed this EKG  ASSESSMENT AND PLAN:   Hyperlipidemia History of hyperlipidemia intolerant to statin drugs   Essential hypertension History of hypertension with blood pressure measured today at 132/80. She is on losartan and hydrochlorothiazide. Continue current meds at current dosing       Lorretta Harp MD [facility] 06/22/2014 10:20 AM

## 2014-06-22 NOTE — Patient Instructions (Signed)
Your physician wants you to follow-up in 1 year with Dr. Berry. You will receive a reminder letter in the mail 2 months in advance. If you do not receive a letter, please call our office to schedule the follow-up appointment.  

## 2014-06-22 NOTE — Assessment & Plan Note (Signed)
History of hyperlipidemia intolerant to statin drugs 

## 2014-06-22 NOTE — Assessment & Plan Note (Signed)
History of hypertension with blood pressure measured today at 132/80. She is on losartan and hydrochlorothiazide. Continue current meds at current dosing

## 2014-06-23 DIAGNOSIS — M545 Low back pain: Secondary | ICD-10-CM | POA: Diagnosis not present

## 2014-06-23 DIAGNOSIS — R293 Abnormal posture: Secondary | ICD-10-CM | POA: Diagnosis not present

## 2014-06-23 DIAGNOSIS — M6281 Muscle weakness (generalized): Secondary | ICD-10-CM | POA: Diagnosis not present

## 2014-06-23 DIAGNOSIS — M2560 Stiffness of unspecified joint, not elsewhere classified: Secondary | ICD-10-CM | POA: Diagnosis not present

## 2014-06-24 DIAGNOSIS — M2560 Stiffness of unspecified joint, not elsewhere classified: Secondary | ICD-10-CM | POA: Diagnosis not present

## 2014-06-24 DIAGNOSIS — M6281 Muscle weakness (generalized): Secondary | ICD-10-CM | POA: Diagnosis not present

## 2014-06-24 DIAGNOSIS — R293 Abnormal posture: Secondary | ICD-10-CM | POA: Diagnosis not present

## 2014-06-24 DIAGNOSIS — M545 Low back pain: Secondary | ICD-10-CM | POA: Diagnosis not present

## 2014-06-25 DIAGNOSIS — M6281 Muscle weakness (generalized): Secondary | ICD-10-CM | POA: Diagnosis not present

## 2014-06-25 DIAGNOSIS — M545 Low back pain: Secondary | ICD-10-CM | POA: Diagnosis not present

## 2014-06-25 DIAGNOSIS — R293 Abnormal posture: Secondary | ICD-10-CM | POA: Diagnosis not present

## 2014-06-25 DIAGNOSIS — M2560 Stiffness of unspecified joint, not elsewhere classified: Secondary | ICD-10-CM | POA: Diagnosis not present

## 2014-06-30 DIAGNOSIS — M2560 Stiffness of unspecified joint, not elsewhere classified: Secondary | ICD-10-CM | POA: Diagnosis not present

## 2014-06-30 DIAGNOSIS — R293 Abnormal posture: Secondary | ICD-10-CM | POA: Diagnosis not present

## 2014-06-30 DIAGNOSIS — M545 Low back pain: Secondary | ICD-10-CM | POA: Diagnosis not present

## 2014-06-30 DIAGNOSIS — M6281 Muscle weakness (generalized): Secondary | ICD-10-CM | POA: Diagnosis not present

## 2014-07-01 DIAGNOSIS — M2560 Stiffness of unspecified joint, not elsewhere classified: Secondary | ICD-10-CM | POA: Diagnosis not present

## 2014-07-01 DIAGNOSIS — M545 Low back pain: Secondary | ICD-10-CM | POA: Diagnosis not present

## 2014-07-01 DIAGNOSIS — R293 Abnormal posture: Secondary | ICD-10-CM | POA: Diagnosis not present

## 2014-07-01 DIAGNOSIS — M6281 Muscle weakness (generalized): Secondary | ICD-10-CM | POA: Diagnosis not present

## 2014-07-02 DIAGNOSIS — R293 Abnormal posture: Secondary | ICD-10-CM | POA: Diagnosis not present

## 2014-07-02 DIAGNOSIS — M545 Low back pain: Secondary | ICD-10-CM | POA: Diagnosis not present

## 2014-07-02 DIAGNOSIS — M6281 Muscle weakness (generalized): Secondary | ICD-10-CM | POA: Diagnosis not present

## 2014-07-02 DIAGNOSIS — M2560 Stiffness of unspecified joint, not elsewhere classified: Secondary | ICD-10-CM | POA: Diagnosis not present

## 2014-07-02 DIAGNOSIS — E538 Deficiency of other specified B group vitamins: Secondary | ICD-10-CM | POA: Diagnosis not present

## 2014-07-07 DIAGNOSIS — R293 Abnormal posture: Secondary | ICD-10-CM | POA: Diagnosis not present

## 2014-07-07 DIAGNOSIS — M2560 Stiffness of unspecified joint, not elsewhere classified: Secondary | ICD-10-CM | POA: Diagnosis not present

## 2014-07-07 DIAGNOSIS — M545 Low back pain: Secondary | ICD-10-CM | POA: Diagnosis not present

## 2014-07-07 DIAGNOSIS — M6281 Muscle weakness (generalized): Secondary | ICD-10-CM | POA: Diagnosis not present

## 2014-07-08 DIAGNOSIS — M2560 Stiffness of unspecified joint, not elsewhere classified: Secondary | ICD-10-CM | POA: Diagnosis not present

## 2014-07-08 DIAGNOSIS — R293 Abnormal posture: Secondary | ICD-10-CM | POA: Diagnosis not present

## 2014-07-08 DIAGNOSIS — M6281 Muscle weakness (generalized): Secondary | ICD-10-CM | POA: Diagnosis not present

## 2014-07-08 DIAGNOSIS — M545 Low back pain: Secondary | ICD-10-CM | POA: Diagnosis not present

## 2014-07-09 DIAGNOSIS — M6281 Muscle weakness (generalized): Secondary | ICD-10-CM | POA: Diagnosis not present

## 2014-07-09 DIAGNOSIS — M2560 Stiffness of unspecified joint, not elsewhere classified: Secondary | ICD-10-CM | POA: Diagnosis not present

## 2014-07-09 DIAGNOSIS — R293 Abnormal posture: Secondary | ICD-10-CM | POA: Diagnosis not present

## 2014-07-09 DIAGNOSIS — M545 Low back pain: Secondary | ICD-10-CM | POA: Diagnosis not present

## 2014-07-12 DIAGNOSIS — M6281 Muscle weakness (generalized): Secondary | ICD-10-CM | POA: Diagnosis not present

## 2014-07-12 DIAGNOSIS — M545 Low back pain: Secondary | ICD-10-CM | POA: Diagnosis not present

## 2014-07-12 DIAGNOSIS — M2560 Stiffness of unspecified joint, not elsewhere classified: Secondary | ICD-10-CM | POA: Diagnosis not present

## 2014-07-12 DIAGNOSIS — R293 Abnormal posture: Secondary | ICD-10-CM | POA: Diagnosis not present

## 2014-07-13 DIAGNOSIS — Z1231 Encounter for screening mammogram for malignant neoplasm of breast: Secondary | ICD-10-CM | POA: Diagnosis not present

## 2014-07-14 DIAGNOSIS — M545 Low back pain: Secondary | ICD-10-CM | POA: Diagnosis not present

## 2014-07-14 DIAGNOSIS — M6281 Muscle weakness (generalized): Secondary | ICD-10-CM | POA: Diagnosis not present

## 2014-07-14 DIAGNOSIS — M2560 Stiffness of unspecified joint, not elsewhere classified: Secondary | ICD-10-CM | POA: Diagnosis not present

## 2014-07-14 DIAGNOSIS — R293 Abnormal posture: Secondary | ICD-10-CM | POA: Diagnosis not present

## 2014-07-16 DIAGNOSIS — M6281 Muscle weakness (generalized): Secondary | ICD-10-CM | POA: Diagnosis not present

## 2014-07-16 DIAGNOSIS — M545 Low back pain: Secondary | ICD-10-CM | POA: Diagnosis not present

## 2014-07-16 DIAGNOSIS — M2560 Stiffness of unspecified joint, not elsewhere classified: Secondary | ICD-10-CM | POA: Diagnosis not present

## 2014-07-16 DIAGNOSIS — R293 Abnormal posture: Secondary | ICD-10-CM | POA: Diagnosis not present

## 2014-07-20 DIAGNOSIS — R293 Abnormal posture: Secondary | ICD-10-CM | POA: Diagnosis not present

## 2014-07-20 DIAGNOSIS — M545 Low back pain: Secondary | ICD-10-CM | POA: Diagnosis not present

## 2014-07-20 DIAGNOSIS — M2560 Stiffness of unspecified joint, not elsewhere classified: Secondary | ICD-10-CM | POA: Diagnosis not present

## 2014-07-20 DIAGNOSIS — M6281 Muscle weakness (generalized): Secondary | ICD-10-CM | POA: Diagnosis not present

## 2014-07-21 DIAGNOSIS — M2560 Stiffness of unspecified joint, not elsewhere classified: Secondary | ICD-10-CM | POA: Diagnosis not present

## 2014-07-21 DIAGNOSIS — M545 Low back pain: Secondary | ICD-10-CM | POA: Diagnosis not present

## 2014-07-21 DIAGNOSIS — R293 Abnormal posture: Secondary | ICD-10-CM | POA: Diagnosis not present

## 2014-07-21 DIAGNOSIS — M6281 Muscle weakness (generalized): Secondary | ICD-10-CM | POA: Diagnosis not present

## 2014-07-23 DIAGNOSIS — M2560 Stiffness of unspecified joint, not elsewhere classified: Secondary | ICD-10-CM | POA: Diagnosis not present

## 2014-07-23 DIAGNOSIS — M545 Low back pain: Secondary | ICD-10-CM | POA: Diagnosis not present

## 2014-07-23 DIAGNOSIS — R293 Abnormal posture: Secondary | ICD-10-CM | POA: Diagnosis not present

## 2014-07-23 DIAGNOSIS — M6281 Muscle weakness (generalized): Secondary | ICD-10-CM | POA: Diagnosis not present

## 2014-07-26 DIAGNOSIS — M2560 Stiffness of unspecified joint, not elsewhere classified: Secondary | ICD-10-CM | POA: Diagnosis not present

## 2014-07-26 DIAGNOSIS — R293 Abnormal posture: Secondary | ICD-10-CM | POA: Diagnosis not present

## 2014-07-26 DIAGNOSIS — M6281 Muscle weakness (generalized): Secondary | ICD-10-CM | POA: Diagnosis not present

## 2014-07-26 DIAGNOSIS — M545 Low back pain: Secondary | ICD-10-CM | POA: Diagnosis not present

## 2014-07-28 DIAGNOSIS — M6281 Muscle weakness (generalized): Secondary | ICD-10-CM | POA: Diagnosis not present

## 2014-07-28 DIAGNOSIS — H11823 Conjunctivochalasis, bilateral: Secondary | ICD-10-CM | POA: Diagnosis not present

## 2014-07-28 DIAGNOSIS — M545 Low back pain: Secondary | ICD-10-CM | POA: Diagnosis not present

## 2014-07-28 DIAGNOSIS — H04123 Dry eye syndrome of bilateral lacrimal glands: Secondary | ICD-10-CM | POA: Diagnosis not present

## 2014-07-28 DIAGNOSIS — H40013 Open angle with borderline findings, low risk, bilateral: Secondary | ICD-10-CM | POA: Diagnosis not present

## 2014-07-28 DIAGNOSIS — R293 Abnormal posture: Secondary | ICD-10-CM | POA: Diagnosis not present

## 2014-07-28 DIAGNOSIS — H01021 Squamous blepharitis right upper eyelid: Secondary | ICD-10-CM | POA: Diagnosis not present

## 2014-07-28 DIAGNOSIS — H01022 Squamous blepharitis right lower eyelid: Secondary | ICD-10-CM | POA: Diagnosis not present

## 2014-07-28 DIAGNOSIS — H10413 Chronic giant papillary conjunctivitis, bilateral: Secondary | ICD-10-CM | POA: Diagnosis not present

## 2014-07-28 DIAGNOSIS — M2560 Stiffness of unspecified joint, not elsewhere classified: Secondary | ICD-10-CM | POA: Diagnosis not present

## 2014-07-28 DIAGNOSIS — H01025 Squamous blepharitis left lower eyelid: Secondary | ICD-10-CM | POA: Diagnosis not present

## 2014-07-28 DIAGNOSIS — H43813 Vitreous degeneration, bilateral: Secondary | ICD-10-CM | POA: Diagnosis not present

## 2014-07-28 DIAGNOSIS — Z961 Presence of intraocular lens: Secondary | ICD-10-CM | POA: Diagnosis not present

## 2014-07-28 DIAGNOSIS — H01024 Squamous blepharitis left upper eyelid: Secondary | ICD-10-CM | POA: Diagnosis not present

## 2014-07-29 DIAGNOSIS — M545 Low back pain: Secondary | ICD-10-CM | POA: Diagnosis not present

## 2014-07-29 DIAGNOSIS — R293 Abnormal posture: Secondary | ICD-10-CM | POA: Diagnosis not present

## 2014-07-29 DIAGNOSIS — M6281 Muscle weakness (generalized): Secondary | ICD-10-CM | POA: Diagnosis not present

## 2014-07-29 DIAGNOSIS — M2560 Stiffness of unspecified joint, not elsewhere classified: Secondary | ICD-10-CM | POA: Diagnosis not present

## 2014-08-02 DIAGNOSIS — M6281 Muscle weakness (generalized): Secondary | ICD-10-CM | POA: Diagnosis not present

## 2014-08-02 DIAGNOSIS — M545 Low back pain: Secondary | ICD-10-CM | POA: Diagnosis not present

## 2014-08-02 DIAGNOSIS — R293 Abnormal posture: Secondary | ICD-10-CM | POA: Diagnosis not present

## 2014-08-02 DIAGNOSIS — M2560 Stiffness of unspecified joint, not elsewhere classified: Secondary | ICD-10-CM | POA: Diagnosis not present

## 2014-08-03 DIAGNOSIS — M79671 Pain in right foot: Secondary | ICD-10-CM | POA: Diagnosis not present

## 2014-08-03 DIAGNOSIS — Q828 Other specified congenital malformations of skin: Secondary | ICD-10-CM | POA: Diagnosis not present

## 2014-08-03 DIAGNOSIS — M2040 Other hammer toe(s) (acquired), unspecified foot: Secondary | ICD-10-CM | POA: Diagnosis not present

## 2014-08-03 DIAGNOSIS — B351 Tinea unguium: Secondary | ICD-10-CM | POA: Diagnosis not present

## 2014-08-04 DIAGNOSIS — M6281 Muscle weakness (generalized): Secondary | ICD-10-CM | POA: Diagnosis not present

## 2014-08-04 DIAGNOSIS — R293 Abnormal posture: Secondary | ICD-10-CM | POA: Diagnosis not present

## 2014-08-04 DIAGNOSIS — M545 Low back pain: Secondary | ICD-10-CM | POA: Diagnosis not present

## 2014-08-04 DIAGNOSIS — M2560 Stiffness of unspecified joint, not elsewhere classified: Secondary | ICD-10-CM | POA: Diagnosis not present

## 2014-08-06 DIAGNOSIS — M2560 Stiffness of unspecified joint, not elsewhere classified: Secondary | ICD-10-CM | POA: Diagnosis not present

## 2014-08-06 DIAGNOSIS — R293 Abnormal posture: Secondary | ICD-10-CM | POA: Diagnosis not present

## 2014-08-06 DIAGNOSIS — M545 Low back pain: Secondary | ICD-10-CM | POA: Diagnosis not present

## 2014-08-06 DIAGNOSIS — M6281 Muscle weakness (generalized): Secondary | ICD-10-CM | POA: Diagnosis not present

## 2014-08-10 DIAGNOSIS — M2560 Stiffness of unspecified joint, not elsewhere classified: Secondary | ICD-10-CM | POA: Diagnosis not present

## 2014-08-10 DIAGNOSIS — R293 Abnormal posture: Secondary | ICD-10-CM | POA: Diagnosis not present

## 2014-08-10 DIAGNOSIS — M545 Low back pain: Secondary | ICD-10-CM | POA: Diagnosis not present

## 2014-08-10 DIAGNOSIS — M6281 Muscle weakness (generalized): Secondary | ICD-10-CM | POA: Diagnosis not present

## 2014-08-11 DIAGNOSIS — M545 Low back pain: Secondary | ICD-10-CM | POA: Diagnosis not present

## 2014-08-11 DIAGNOSIS — M2560 Stiffness of unspecified joint, not elsewhere classified: Secondary | ICD-10-CM | POA: Diagnosis not present

## 2014-08-11 DIAGNOSIS — R293 Abnormal posture: Secondary | ICD-10-CM | POA: Diagnosis not present

## 2014-08-11 DIAGNOSIS — M6281 Muscle weakness (generalized): Secondary | ICD-10-CM | POA: Diagnosis not present

## 2014-08-13 DIAGNOSIS — M6281 Muscle weakness (generalized): Secondary | ICD-10-CM | POA: Diagnosis not present

## 2014-08-13 DIAGNOSIS — R293 Abnormal posture: Secondary | ICD-10-CM | POA: Diagnosis not present

## 2014-08-13 DIAGNOSIS — M545 Low back pain: Secondary | ICD-10-CM | POA: Diagnosis not present

## 2014-08-13 DIAGNOSIS — M2560 Stiffness of unspecified joint, not elsewhere classified: Secondary | ICD-10-CM | POA: Diagnosis not present

## 2014-08-16 DIAGNOSIS — M6281 Muscle weakness (generalized): Secondary | ICD-10-CM | POA: Diagnosis not present

## 2014-08-16 DIAGNOSIS — M2560 Stiffness of unspecified joint, not elsewhere classified: Secondary | ICD-10-CM | POA: Diagnosis not present

## 2014-08-16 DIAGNOSIS — M545 Low back pain: Secondary | ICD-10-CM | POA: Diagnosis not present

## 2014-08-16 DIAGNOSIS — R293 Abnormal posture: Secondary | ICD-10-CM | POA: Diagnosis not present

## 2014-08-29 ENCOUNTER — Other Ambulatory Visit: Payer: Self-pay | Admitting: Internal Medicine

## 2014-08-31 DIAGNOSIS — R159 Full incontinence of feces: Secondary | ICD-10-CM | POA: Diagnosis not present

## 2014-08-31 DIAGNOSIS — N390 Urinary tract infection, site not specified: Secondary | ICD-10-CM | POA: Diagnosis not present

## 2014-08-31 DIAGNOSIS — N3941 Urge incontinence: Secondary | ICD-10-CM | POA: Diagnosis not present

## 2014-08-31 DIAGNOSIS — Z87442 Personal history of urinary calculi: Secondary | ICD-10-CM | POA: Diagnosis not present

## 2014-08-31 DIAGNOSIS — N952 Postmenopausal atrophic vaginitis: Secondary | ICD-10-CM | POA: Diagnosis not present

## 2014-08-31 DIAGNOSIS — Z87440 Personal history of urinary (tract) infections: Secondary | ICD-10-CM | POA: Diagnosis not present

## 2014-08-31 DIAGNOSIS — T83711D Erosion of implanted vaginal mesh and other prosthetic materials to surrounding organ or tissue, subsequent encounter: Secondary | ICD-10-CM | POA: Diagnosis not present

## 2014-09-01 DIAGNOSIS — Z85828 Personal history of other malignant neoplasm of skin: Secondary | ICD-10-CM | POA: Diagnosis not present

## 2014-09-01 DIAGNOSIS — D229 Melanocytic nevi, unspecified: Secondary | ICD-10-CM | POA: Diagnosis not present

## 2014-09-01 DIAGNOSIS — D510 Vitamin B12 deficiency anemia due to intrinsic factor deficiency: Secondary | ICD-10-CM | POA: Diagnosis not present

## 2014-09-01 DIAGNOSIS — D1801 Hemangioma of skin and subcutaneous tissue: Secondary | ICD-10-CM | POA: Diagnosis not present

## 2014-09-01 DIAGNOSIS — L853 Xerosis cutis: Secondary | ICD-10-CM | POA: Diagnosis not present

## 2014-09-01 DIAGNOSIS — L821 Other seborrheic keratosis: Secondary | ICD-10-CM | POA: Diagnosis not present

## 2014-09-06 DIAGNOSIS — N390 Urinary tract infection, site not specified: Secondary | ICD-10-CM | POA: Diagnosis not present

## 2014-09-14 DIAGNOSIS — N3010 Interstitial cystitis (chronic) without hematuria: Secondary | ICD-10-CM | POA: Diagnosis not present

## 2014-09-14 DIAGNOSIS — Z6831 Body mass index (BMI) 31.0-31.9, adult: Secondary | ICD-10-CM | POA: Diagnosis not present

## 2014-09-14 DIAGNOSIS — M797 Fibromyalgia: Secondary | ICD-10-CM | POA: Diagnosis not present

## 2014-09-14 DIAGNOSIS — N342 Other urethritis: Secondary | ICD-10-CM | POA: Diagnosis not present

## 2014-09-22 DIAGNOSIS — R109 Unspecified abdominal pain: Secondary | ICD-10-CM | POA: Diagnosis not present

## 2014-09-22 DIAGNOSIS — D3500 Benign neoplasm of unspecified adrenal gland: Secondary | ICD-10-CM | POA: Diagnosis not present

## 2014-09-22 DIAGNOSIS — D3501 Benign neoplasm of right adrenal gland: Secondary | ICD-10-CM | POA: Diagnosis not present

## 2014-09-22 DIAGNOSIS — N3281 Overactive bladder: Secondary | ICD-10-CM | POA: Diagnosis not present

## 2014-09-22 DIAGNOSIS — N390 Urinary tract infection, site not specified: Secondary | ICD-10-CM | POA: Insufficient documentation

## 2014-09-22 DIAGNOSIS — N3941 Urge incontinence: Secondary | ICD-10-CM | POA: Diagnosis not present

## 2014-09-22 DIAGNOSIS — N952 Postmenopausal atrophic vaginitis: Secondary | ICD-10-CM | POA: Diagnosis not present

## 2014-09-22 DIAGNOSIS — Z79899 Other long term (current) drug therapy: Secondary | ICD-10-CM | POA: Diagnosis not present

## 2014-09-24 DIAGNOSIS — E039 Hypothyroidism, unspecified: Secondary | ICD-10-CM | POA: Diagnosis not present

## 2014-09-24 DIAGNOSIS — D3501 Benign neoplasm of right adrenal gland: Secondary | ICD-10-CM | POA: Diagnosis not present

## 2014-09-28 DIAGNOSIS — M2040 Other hammer toe(s) (acquired), unspecified foot: Secondary | ICD-10-CM | POA: Diagnosis not present

## 2014-09-28 DIAGNOSIS — Q828 Other specified congenital malformations of skin: Secondary | ICD-10-CM | POA: Diagnosis not present

## 2014-09-28 DIAGNOSIS — B351 Tinea unguium: Secondary | ICD-10-CM | POA: Diagnosis not present

## 2014-09-28 DIAGNOSIS — M79671 Pain in right foot: Secondary | ICD-10-CM | POA: Diagnosis not present

## 2014-10-06 DIAGNOSIS — Z6832 Body mass index (BMI) 32.0-32.9, adult: Secondary | ICD-10-CM | POA: Diagnosis not present

## 2014-10-06 DIAGNOSIS — E6609 Other obesity due to excess calories: Secondary | ICD-10-CM | POA: Diagnosis not present

## 2014-10-06 DIAGNOSIS — N390 Urinary tract infection, site not specified: Secondary | ICD-10-CM | POA: Diagnosis not present

## 2014-10-06 DIAGNOSIS — N342 Other urethritis: Secondary | ICD-10-CM | POA: Diagnosis not present

## 2014-10-13 DIAGNOSIS — D510 Vitamin B12 deficiency anemia due to intrinsic factor deficiency: Secondary | ICD-10-CM | POA: Diagnosis not present

## 2014-10-13 DIAGNOSIS — N342 Other urethritis: Secondary | ICD-10-CM | POA: Diagnosis not present

## 2014-10-19 DIAGNOSIS — D3501 Benign neoplasm of right adrenal gland: Secondary | ICD-10-CM | POA: Diagnosis not present

## 2014-11-09 DIAGNOSIS — R5383 Other fatigue: Secondary | ICD-10-CM | POA: Diagnosis not present

## 2014-11-23 DIAGNOSIS — M791 Myalgia: Secondary | ICD-10-CM | POA: Diagnosis not present

## 2014-11-23 DIAGNOSIS — Z87440 Personal history of urinary (tract) infections: Secondary | ICD-10-CM | POA: Diagnosis not present

## 2014-11-23 DIAGNOSIS — N390 Urinary tract infection, site not specified: Secondary | ICD-10-CM | POA: Diagnosis not present

## 2014-11-23 DIAGNOSIS — R5383 Other fatigue: Secondary | ICD-10-CM | POA: Diagnosis not present

## 2014-11-23 DIAGNOSIS — D3501 Benign neoplasm of right adrenal gland: Secondary | ICD-10-CM | POA: Diagnosis not present

## 2014-11-23 DIAGNOSIS — D3500 Benign neoplasm of unspecified adrenal gland: Secondary | ICD-10-CM | POA: Diagnosis not present

## 2014-11-24 DIAGNOSIS — R5383 Other fatigue: Secondary | ICD-10-CM | POA: Diagnosis not present

## 2014-11-25 DIAGNOSIS — D510 Vitamin B12 deficiency anemia due to intrinsic factor deficiency: Secondary | ICD-10-CM | POA: Diagnosis not present

## 2014-11-30 DIAGNOSIS — N390 Urinary tract infection, site not specified: Secondary | ICD-10-CM | POA: Diagnosis not present

## 2014-12-07 DIAGNOSIS — Q828 Other specified congenital malformations of skin: Secondary | ICD-10-CM | POA: Diagnosis not present

## 2014-12-07 DIAGNOSIS — M79673 Pain in unspecified foot: Secondary | ICD-10-CM | POA: Diagnosis not present

## 2014-12-27 DIAGNOSIS — N342 Other urethritis: Secondary | ICD-10-CM | POA: Diagnosis not present

## 2014-12-27 DIAGNOSIS — E6609 Other obesity due to excess calories: Secondary | ICD-10-CM | POA: Diagnosis not present

## 2014-12-27 DIAGNOSIS — Z6833 Body mass index (BMI) 33.0-33.9, adult: Secondary | ICD-10-CM | POA: Diagnosis not present

## 2014-12-27 DIAGNOSIS — E538 Deficiency of other specified B group vitamins: Secondary | ICD-10-CM | POA: Diagnosis not present

## 2014-12-27 DIAGNOSIS — Z1389 Encounter for screening for other disorder: Secondary | ICD-10-CM | POA: Diagnosis not present

## 2015-01-01 DIAGNOSIS — Z23 Encounter for immunization: Secondary | ICD-10-CM | POA: Diagnosis not present

## 2015-01-20 ENCOUNTER — Other Ambulatory Visit: Payer: Self-pay | Admitting: Internal Medicine

## 2015-02-01 DIAGNOSIS — Q828 Other specified congenital malformations of skin: Secondary | ICD-10-CM | POA: Diagnosis not present

## 2015-02-01 DIAGNOSIS — M79673 Pain in unspecified foot: Secondary | ICD-10-CM | POA: Diagnosis not present

## 2015-02-01 DIAGNOSIS — M2041 Other hammer toe(s) (acquired), right foot: Secondary | ICD-10-CM | POA: Diagnosis not present

## 2015-02-14 DIAGNOSIS — D510 Vitamin B12 deficiency anemia due to intrinsic factor deficiency: Secondary | ICD-10-CM | POA: Diagnosis not present

## 2015-02-15 DIAGNOSIS — Q828 Other specified congenital malformations of skin: Secondary | ICD-10-CM | POA: Diagnosis not present

## 2015-02-15 DIAGNOSIS — M2041 Other hammer toe(s) (acquired), right foot: Secondary | ICD-10-CM | POA: Diagnosis not present

## 2015-02-15 DIAGNOSIS — M79673 Pain in unspecified foot: Secondary | ICD-10-CM | POA: Diagnosis not present

## 2015-02-16 ENCOUNTER — Other Ambulatory Visit: Payer: Self-pay | Admitting: Internal Medicine

## 2015-02-21 ENCOUNTER — Encounter: Payer: Self-pay | Admitting: Internal Medicine

## 2015-03-01 DIAGNOSIS — N390 Urinary tract infection, site not specified: Secondary | ICD-10-CM | POA: Diagnosis not present

## 2015-03-20 ENCOUNTER — Other Ambulatory Visit: Payer: Self-pay | Admitting: Internal Medicine

## 2015-03-21 ENCOUNTER — Other Ambulatory Visit: Payer: Self-pay | Admitting: Internal Medicine

## 2015-03-22 ENCOUNTER — Encounter: Payer: Self-pay | Admitting: Internal Medicine

## 2015-03-29 DIAGNOSIS — M2041 Other hammer toe(s) (acquired), right foot: Secondary | ICD-10-CM | POA: Diagnosis not present

## 2015-03-29 DIAGNOSIS — Q828 Other specified congenital malformations of skin: Secondary | ICD-10-CM | POA: Diagnosis not present

## 2015-03-29 DIAGNOSIS — M79673 Pain in unspecified foot: Secondary | ICD-10-CM | POA: Diagnosis not present

## 2015-03-31 DIAGNOSIS — E6609 Other obesity due to excess calories: Secondary | ICD-10-CM | POA: Diagnosis not present

## 2015-03-31 DIAGNOSIS — E782 Mixed hyperlipidemia: Secondary | ICD-10-CM | POA: Diagnosis not present

## 2015-03-31 DIAGNOSIS — Z1389 Encounter for screening for other disorder: Secondary | ICD-10-CM | POA: Diagnosis not present

## 2015-03-31 DIAGNOSIS — E559 Vitamin D deficiency, unspecified: Secondary | ICD-10-CM | POA: Diagnosis not present

## 2015-03-31 DIAGNOSIS — R7309 Other abnormal glucose: Secondary | ICD-10-CM | POA: Diagnosis not present

## 2015-03-31 DIAGNOSIS — M797 Fibromyalgia: Secondary | ICD-10-CM | POA: Diagnosis not present

## 2015-03-31 DIAGNOSIS — M1991 Primary osteoarthritis, unspecified site: Secondary | ICD-10-CM | POA: Diagnosis not present

## 2015-03-31 DIAGNOSIS — M169 Osteoarthritis of hip, unspecified: Secondary | ICD-10-CM | POA: Diagnosis not present

## 2015-03-31 DIAGNOSIS — D179 Benign lipomatous neoplasm, unspecified: Secondary | ICD-10-CM | POA: Diagnosis not present

## 2015-03-31 DIAGNOSIS — E039 Hypothyroidism, unspecified: Secondary | ICD-10-CM | POA: Diagnosis not present

## 2015-03-31 DIAGNOSIS — Z6833 Body mass index (BMI) 33.0-33.9, adult: Secondary | ICD-10-CM | POA: Diagnosis not present

## 2015-03-31 DIAGNOSIS — E538 Deficiency of other specified B group vitamins: Secondary | ICD-10-CM | POA: Diagnosis not present

## 2015-04-12 DIAGNOSIS — N390 Urinary tract infection, site not specified: Secondary | ICD-10-CM | POA: Diagnosis not present

## 2015-04-12 DIAGNOSIS — T83711D Erosion of implanted vaginal mesh and other prosthetic materials to surrounding organ or tissue, subsequent encounter: Secondary | ICD-10-CM | POA: Diagnosis not present

## 2015-04-12 DIAGNOSIS — N3281 Overactive bladder: Secondary | ICD-10-CM | POA: Diagnosis not present

## 2015-04-12 DIAGNOSIS — Z960 Presence of urogenital implants: Secondary | ICD-10-CM | POA: Diagnosis not present

## 2015-04-12 DIAGNOSIS — N3946 Mixed incontinence: Secondary | ICD-10-CM | POA: Diagnosis not present

## 2015-04-20 DIAGNOSIS — D171 Benign lipomatous neoplasm of skin and subcutaneous tissue of trunk: Secondary | ICD-10-CM | POA: Diagnosis not present

## 2015-04-27 DIAGNOSIS — R0989 Other specified symptoms and signs involving the circulatory and respiratory systems: Secondary | ICD-10-CM | POA: Diagnosis not present

## 2015-04-27 DIAGNOSIS — E7800 Pure hypercholesterolemia, unspecified: Secondary | ICD-10-CM | POA: Diagnosis not present

## 2015-05-02 DIAGNOSIS — R945 Abnormal results of liver function studies: Secondary | ICD-10-CM | POA: Diagnosis not present

## 2015-05-10 DIAGNOSIS — D485 Neoplasm of uncertain behavior of skin: Secondary | ICD-10-CM | POA: Diagnosis not present

## 2015-05-10 DIAGNOSIS — L570 Actinic keratosis: Secondary | ICD-10-CM | POA: Diagnosis not present

## 2015-05-10 DIAGNOSIS — M79673 Pain in unspecified foot: Secondary | ICD-10-CM | POA: Diagnosis not present

## 2015-05-10 DIAGNOSIS — D0439 Carcinoma in situ of skin of other parts of face: Secondary | ICD-10-CM | POA: Diagnosis not present

## 2015-05-10 DIAGNOSIS — Q828 Other specified congenital malformations of skin: Secondary | ICD-10-CM | POA: Diagnosis not present

## 2015-05-10 DIAGNOSIS — C44319 Basal cell carcinoma of skin of other parts of face: Secondary | ICD-10-CM | POA: Diagnosis not present

## 2015-05-10 DIAGNOSIS — D1801 Hemangioma of skin and subcutaneous tissue: Secondary | ICD-10-CM | POA: Diagnosis not present

## 2015-05-10 DIAGNOSIS — M2041 Other hammer toe(s) (acquired), right foot: Secondary | ICD-10-CM | POA: Diagnosis not present

## 2015-05-10 DIAGNOSIS — L821 Other seborrheic keratosis: Secondary | ICD-10-CM | POA: Diagnosis not present

## 2015-05-27 DIAGNOSIS — E538 Deficiency of other specified B group vitamins: Secondary | ICD-10-CM | POA: Diagnosis not present

## 2015-05-27 DIAGNOSIS — Z1389 Encounter for screening for other disorder: Secondary | ICD-10-CM | POA: Diagnosis not present

## 2015-05-27 DIAGNOSIS — Z6833 Body mass index (BMI) 33.0-33.9, adult: Secondary | ICD-10-CM | POA: Diagnosis not present

## 2015-05-27 DIAGNOSIS — R0989 Other specified symptoms and signs involving the circulatory and respiratory systems: Secondary | ICD-10-CM | POA: Diagnosis not present

## 2015-05-27 DIAGNOSIS — N342 Other urethritis: Secondary | ICD-10-CM | POA: Diagnosis not present

## 2015-05-27 DIAGNOSIS — E7800 Pure hypercholesterolemia, unspecified: Secondary | ICD-10-CM | POA: Diagnosis not present

## 2015-05-27 DIAGNOSIS — E6609 Other obesity due to excess calories: Secondary | ICD-10-CM | POA: Diagnosis not present

## 2015-06-01 DIAGNOSIS — D0439 Carcinoma in situ of skin of other parts of face: Secondary | ICD-10-CM | POA: Diagnosis not present

## 2015-06-01 DIAGNOSIS — C44319 Basal cell carcinoma of skin of other parts of face: Secondary | ICD-10-CM | POA: Diagnosis not present

## 2015-06-07 DIAGNOSIS — Z87440 Personal history of urinary (tract) infections: Secondary | ICD-10-CM | POA: Diagnosis not present

## 2015-06-07 DIAGNOSIS — N390 Urinary tract infection, site not specified: Secondary | ICD-10-CM | POA: Diagnosis not present

## 2015-06-07 DIAGNOSIS — Z9889 Other specified postprocedural states: Secondary | ICD-10-CM | POA: Diagnosis not present

## 2015-06-08 DIAGNOSIS — E7800 Pure hypercholesterolemia, unspecified: Secondary | ICD-10-CM | POA: Diagnosis not present

## 2015-06-08 DIAGNOSIS — D3501 Benign neoplasm of right adrenal gland: Secondary | ICD-10-CM | POA: Diagnosis not present

## 2015-06-14 ENCOUNTER — Other Ambulatory Visit: Payer: Self-pay | Admitting: Internal Medicine

## 2015-06-21 DIAGNOSIS — H01022 Squamous blepharitis right lower eyelid: Secondary | ICD-10-CM | POA: Diagnosis not present

## 2015-06-21 DIAGNOSIS — Z961 Presence of intraocular lens: Secondary | ICD-10-CM | POA: Diagnosis not present

## 2015-06-21 DIAGNOSIS — H01025 Squamous blepharitis left lower eyelid: Secondary | ICD-10-CM | POA: Diagnosis not present

## 2015-06-21 DIAGNOSIS — H04123 Dry eye syndrome of bilateral lacrimal glands: Secondary | ICD-10-CM | POA: Diagnosis not present

## 2015-06-21 DIAGNOSIS — H01024 Squamous blepharitis left upper eyelid: Secondary | ICD-10-CM | POA: Diagnosis not present

## 2015-06-21 DIAGNOSIS — H01021 Squamous blepharitis right upper eyelid: Secondary | ICD-10-CM | POA: Diagnosis not present

## 2015-06-21 DIAGNOSIS — H10413 Chronic giant papillary conjunctivitis, bilateral: Secondary | ICD-10-CM | POA: Diagnosis not present

## 2015-07-08 DIAGNOSIS — Q828 Other specified congenital malformations of skin: Secondary | ICD-10-CM | POA: Diagnosis not present

## 2015-07-08 DIAGNOSIS — M2041 Other hammer toe(s) (acquired), right foot: Secondary | ICD-10-CM | POA: Diagnosis not present

## 2015-07-08 DIAGNOSIS — M79673 Pain in unspecified foot: Secondary | ICD-10-CM | POA: Diagnosis not present

## 2015-07-15 DIAGNOSIS — Z1231 Encounter for screening mammogram for malignant neoplasm of breast: Secondary | ICD-10-CM | POA: Diagnosis not present

## 2015-07-22 DIAGNOSIS — D519 Vitamin B12 deficiency anemia, unspecified: Secondary | ICD-10-CM | POA: Diagnosis not present

## 2015-08-02 DIAGNOSIS — E6609 Other obesity due to excess calories: Secondary | ICD-10-CM | POA: Diagnosis not present

## 2015-08-02 DIAGNOSIS — Z0000 Encounter for general adult medical examination without abnormal findings: Secondary | ICD-10-CM | POA: Diagnosis not present

## 2015-08-02 DIAGNOSIS — Z6834 Body mass index (BMI) 34.0-34.9, adult: Secondary | ICD-10-CM | POA: Diagnosis not present

## 2015-08-02 DIAGNOSIS — Z1389 Encounter for screening for other disorder: Secondary | ICD-10-CM | POA: Diagnosis not present

## 2015-08-09 DIAGNOSIS — E6609 Other obesity due to excess calories: Secondary | ICD-10-CM | POA: Diagnosis not present

## 2015-08-09 DIAGNOSIS — J019 Acute sinusitis, unspecified: Secondary | ICD-10-CM | POA: Diagnosis not present

## 2015-08-09 DIAGNOSIS — Z1389 Encounter for screening for other disorder: Secondary | ICD-10-CM | POA: Diagnosis not present

## 2015-08-09 DIAGNOSIS — B349 Viral infection, unspecified: Secondary | ICD-10-CM | POA: Diagnosis not present

## 2015-08-09 DIAGNOSIS — Z6833 Body mass index (BMI) 33.0-33.9, adult: Secondary | ICD-10-CM | POA: Diagnosis not present

## 2015-08-09 DIAGNOSIS — J301 Allergic rhinitis due to pollen: Secondary | ICD-10-CM | POA: Diagnosis not present

## 2015-08-10 DIAGNOSIS — H10413 Chronic giant papillary conjunctivitis, bilateral: Secondary | ICD-10-CM | POA: Diagnosis not present

## 2015-08-10 DIAGNOSIS — H04123 Dry eye syndrome of bilateral lacrimal glands: Secondary | ICD-10-CM | POA: Diagnosis not present

## 2015-08-10 DIAGNOSIS — Z961 Presence of intraocular lens: Secondary | ICD-10-CM | POA: Diagnosis not present

## 2015-08-10 DIAGNOSIS — H40013 Open angle with borderline findings, low risk, bilateral: Secondary | ICD-10-CM | POA: Diagnosis not present

## 2015-08-23 DIAGNOSIS — R350 Frequency of micturition: Secondary | ICD-10-CM | POA: Diagnosis not present

## 2015-08-23 DIAGNOSIS — E6609 Other obesity due to excess calories: Secondary | ICD-10-CM | POA: Diagnosis not present

## 2015-08-23 DIAGNOSIS — Z6831 Body mass index (BMI) 31.0-31.9, adult: Secondary | ICD-10-CM | POA: Diagnosis not present

## 2015-08-23 DIAGNOSIS — J069 Acute upper respiratory infection, unspecified: Secondary | ICD-10-CM | POA: Diagnosis not present

## 2015-08-23 DIAGNOSIS — N342 Other urethritis: Secondary | ICD-10-CM | POA: Diagnosis not present

## 2015-08-23 DIAGNOSIS — Z1389 Encounter for screening for other disorder: Secondary | ICD-10-CM | POA: Diagnosis not present

## 2015-08-26 DIAGNOSIS — Z6832 Body mass index (BMI) 32.0-32.9, adult: Secondary | ICD-10-CM | POA: Diagnosis not present

## 2015-08-26 DIAGNOSIS — M545 Low back pain: Secondary | ICD-10-CM | POA: Diagnosis not present

## 2015-08-26 DIAGNOSIS — M5416 Radiculopathy, lumbar region: Secondary | ICD-10-CM | POA: Diagnosis not present

## 2015-08-30 ENCOUNTER — Other Ambulatory Visit: Payer: Self-pay | Admitting: Neurosurgery

## 2015-08-30 DIAGNOSIS — M5416 Radiculopathy, lumbar region: Secondary | ICD-10-CM

## 2015-09-02 DIAGNOSIS — M79673 Pain in unspecified foot: Secondary | ICD-10-CM | POA: Diagnosis not present

## 2015-09-02 DIAGNOSIS — Q828 Other specified congenital malformations of skin: Secondary | ICD-10-CM | POA: Diagnosis not present

## 2015-09-02 DIAGNOSIS — M2041 Other hammer toe(s) (acquired), right foot: Secondary | ICD-10-CM | POA: Diagnosis not present

## 2015-09-09 ENCOUNTER — Ambulatory Visit
Admission: RE | Admit: 2015-09-09 | Discharge: 2015-09-09 | Disposition: A | Discharge status: Home / Self Care | Payer: Medicare Other | Source: Ambulatory Visit | Attending: Neurosurgery | Admitting: Neurosurgery

## 2015-09-09 DIAGNOSIS — M5416 Radiculopathy, lumbar region: Secondary | ICD-10-CM

## 2015-09-09 DIAGNOSIS — M5126 Other intervertebral disc displacement, lumbar region: Secondary | ICD-10-CM | POA: Diagnosis not present

## 2015-09-09 MED ORDER — DIAZEPAM 5 MG PO TABS
5.0000 mg | ORAL_TABLET | Freq: Once | ORAL | Status: AC
  Administered 2015-09-09: 5 mg via ORAL

## 2015-09-09 MED ORDER — IOPAMIDOL (ISOVUE-M 200) INJECTION 41%
15.0000 mL | Freq: Once | INTRAMUSCULAR | Status: AC
  Administered 2015-09-09: 15 mL via INTRATHECAL

## 2015-09-09 NOTE — Discharge Instructions (Signed)
Myelogram Discharge Instructions  1. Go home and rest quietly for the next 24 hours.  It is important to lie flat for the next 24 hours.  Get up only to go to the restroom.  You may lie in the bed or on a couch on your back, your stomach, your left side or your right side.  You may have one pillow under your head.  You may have pillows between your knees while you are on your side or under your knees while you are on your back.  2. DO NOT drive today.  Recline the seat as far back as it will go, while still wearing your seat belt, on the way home.  3. You may get up to go to the bathroom as needed.  You may sit up for 10 minutes to eat.  You may resume your normal diet and medications unless otherwise indicated.  Drink lots of extra fluids today and tomorrow.  4. The incidence of headache, nausea, or vomiting is about 5% (one in 20 patients).  If you develop a headache, lie flat and drink plenty of fluids until the headache goes away.  Caffeinated beverages may be helpful.  If you develop severe nausea and vomiting or a headache that does not go away with flat bed rest, call [phone].  5. You may resume normal activities after your 24 hours of bed rest is over; however, do not exert yourself strongly or do any heavy lifting tomorrow. If when you get up you have a headache when standing, go back to bed and force fluids for another 24 hours.  6. Call your physician for a follow-up appointment.  The results of your myelogram will be sent directly to your physician by the following day.  7. If you have any questions or if complications develop after you arrive home, please call [phone].  Discharge instructions have been explained to the patient.  The patient, or the person responsible for the patient, fully understands these instructions.       May resume Melnacipram on September 10, 2015, after 9:30 am.

## 2015-09-09 NOTE — Progress Notes (Signed)
Pt states she has been off Melnacipram for the past 2 days.

## 2015-09-20 ENCOUNTER — Encounter: Payer: Self-pay | Admitting: Internal Medicine

## 2015-09-20 ENCOUNTER — Ambulatory Visit (INDEPENDENT_AMBULATORY_CARE_PROVIDER_SITE_OTHER): Payer: Medicare Other | Admitting: Internal Medicine

## 2015-09-20 VITALS — BP 110/68 | HR 96 | Ht 60.75 in | Wt 167.5 lb

## 2015-09-20 DIAGNOSIS — Z6831 Body mass index (BMI) 31.0-31.9, adult: Secondary | ICD-10-CM | POA: Diagnosis not present

## 2015-09-20 DIAGNOSIS — R1013 Epigastric pain: Secondary | ICD-10-CM | POA: Diagnosis not present

## 2015-09-20 DIAGNOSIS — K219 Gastro-esophageal reflux disease without esophagitis: Secondary | ICD-10-CM

## 2015-09-20 DIAGNOSIS — Z86010 Personal history of colonic polyps: Secondary | ICD-10-CM | POA: Diagnosis not present

## 2015-09-20 DIAGNOSIS — R0789 Other chest pain: Secondary | ICD-10-CM

## 2015-09-20 DIAGNOSIS — M4125 Other idiopathic scoliosis, thoracolumbar region: Secondary | ICD-10-CM | POA: Diagnosis not present

## 2015-09-20 NOTE — Progress Notes (Signed)
Subjective:    Patient ID: Audrey [name], female    DOB: 1940/08/21, 75 y.o.   MRN: [id number] Cc: reflux, hx colon polyps HPI The patient is here w/ c/o some regurgitation and reflux when bending over. Occasional tightness in her chest, belching, bitter taste in mouth at times. Some burning chest pain at times - not exertional.Is intentionally losing weight 182 to 167 # high protein low carb diet. Is on pantoprazole - also takes alendronate and daily doxycycline. No dysphagia, odynophagia Previous manometry ok 2015 EGD/dilation 2014. Allergies  Allergen Reactions  . Erythromycin Diarrhea  . Tramadol Nausea And Vomiting and Other (See Comments)    Dizziness   . Bactrim   . Vytorin [Ezetimibe-Simvastatin]   . Adhesive [Tape] Rash  . Statins Other (See Comments)    Makes her hurt bad   Outpatient Prescriptions Prior to Visit  Medication Sig Dispense Refill  . alendronate (FOSAMAX) 70 MG tablet Take 70 mg by mouth every 7 (seven) days. Take with a full glass of water on an empty stomach.    . AMBULATORY NON FORMULARY MEDICATION Oxygen @ 2LMP At bedtime    . carboxymethylcellulose (REFRESH TEARS) 0.5 % SOLN Place 1 drop into both eyes every 3 (three) hours.    . chlordiazePOXIDE (LIBRIUM) 10 MG capsule Take 10 mg by mouth as needed.    . Cholecalciferol (VITAMIN D3) 2000 UNITS TABS Take 2 tablets by mouth daily.    . Coenzyme Q10 200 MG capsule Take 200 mg by mouth daily.    . cyanocobalamin (,VITAMIN B-12,) 1000 MCG/ML injection Inject 1,000 mcg into the muscle every 30 (thirty) days.    Marland Kitchen erythromycin ophthalmic ointment Place 1 application into both eyes every morning.    . fluocinonide cream (LIDEX) AB-123456789 % Apply 1 application topically 2 (two) times daily. 2 weeks of using cream; 2 weeks of not using cream    . furosemide (LASIX) 40 MG tablet 1-2 tablets by mouth once daily    . gabapentin (NEURONTIN) 300 MG capsule Take 300 mg by mouth. One tablet in the morning, one tablet in the  afternoon, and 2 tablets in the evening.    Marland Kitchen KRILL OIL PO Take 1 capsule by mouth daily.    Marland Kitchen levothyroxine (SYNTHROID, LEVOTHROID) 50 MCG tablet Take 50 mcg by mouth daily.    Marland Kitchen losartan-hydrochlorothiazide (HYZAAR) 100-25 MG per tablet Take by mouth daily. 1/2 tablet daily    . Milnacipran (SAVELLA) 50 MG TABS Take 50 mg by mouth 2 (two) times daily.    . pantoprazole (PROTONIX) 40 MG tablet TAKE 1 TABLET BY MOUTH DAILY BEFORE BREAKFAST AND 1 TABLET BY MOUTH DAILY BEFORE DINNER 60 tablet 0  . potassium chloride SA (K-DUR,KLOR-CON) 20 MEQ tablet Take 2 tablets by mouth every morning and 2 tablets by mouth every evening    . doxycycline (VIBRAMYCIN) 50 MG capsule Take 50 mg by mouth daily.    Marland Kitchen ketotifen (ALAWAY) 0.025 % ophthalmic solution Place 1 drop into both eyes 2 (two) times daily.    . meloxicam (MOBIC) 15 MG tablet     . pantoprazole (PROTONIX) 40 MG tablet TAKE 1 TABLET BY MOUTH DAILY BEFORE BREAKFAST AND 1 TABLET BY MOUTH DAILY BEFORE DINNER 180 tablet 0   No facility-administered medications prior to visit.   Past Medical History  Diagnosis Date  . Allergic rhinitis   . Anxiety and depression   . Arthritis   . Obesity   . Colon polyp   .  Diverticulosis   . HLD (hyperlipidemia)   . HTN (hypertension)   . Hypothyroidism   . Raynaud's syndrome   . Meniere's disease   . Gastric polyps   . GERD (gastroesophageal reflux disease)   . Fibromyalgia   . Sleep apnea   . Right hip pain 08/18/2012  . Family history of heart disease   . History of nuclear stress test 04/2010    nonischemic   . Basal cell carcinoma 2002  . Pinched vertebral nerve    Past Surgical History  Procedure Laterality Date  . Abdominal hysterectomy  1982  . Tonsillectomy  1959  . Cataract extraction      bilateral  . Bladder repair  2005    ovaries, tubes removed; pelvic prolapse corrected; rectal repair  . Colonoscopy w/ biopsies and polypectomy  12/20/2009    diverticulosis, adenomatous polyps  .  Upper gastrointestinal endoscopy  12/20/2009    GERD  . Foot surgery  1999    bilateral  . Basal cell carcinoma excision  03-31-01    face  . Lipoma excision      right arm  . Bladder surgery  01-2009    with mesh placement  . Laser surgery bladder  05-25-10    on mesh  . Nasolacrimal duct probing  09-12-10    right eye  . Nasolacrimal duct probing  09-26-10    left eye  . Removal of bladder mesh  03-01-11    [facility]  . Interstim implant placement  01/2012    [facility]  . Esophagogastroduodenoscopy (egd) with esophageal dilation  06/09/2012    Procedure: ESOPHAGOGASTRODUODENOSCOPY (EGD) WITH ESOPHAGEAL DILATION;  Surgeon: Gatha Mayer, MD;  Location: [facility];  Service: Endoscopy;  Laterality: N/A;  . Transthoracic echocardiogram  12/2006    EF=>55%; LA mild-mod dilated; RA mildly dilated; mild mitral annular calcif, mild MR; mod TR & elevated RVSP; mild pulm vavle regurg  . Esophageal manometry N/A 05/25/2013    Procedure: ESOPHAGEAL MANOMETRY (EM);  Surgeon: Gatha Mayer, MD;  Location: [facility];  Service: Endoscopy;  Laterality: N/A;   Social History   Social History  . Marital Status: Married    Spouse Name: N/A  . Number of Children: 3  . Years of Education: N/A   Occupational History  . retired - [employer]    Social History Main Topics  . Smoking status: Never Smoker   . Smokeless tobacco: Never Used  . Alcohol Use: No  . Drug Use: No  . Sexual Activity: Not Asked   Other Topics Concern  . None   Social History Narrative   Married   1 son and 2 daughters, 10 grandchildren   Remains active   Family History  Problem Relation Age of Onset  . Pancreatic cancer Mother   . Diabetes Mother   . Tuberculosis Mother   . Prostate cancer Father   . Colon polyps Father   . Diabetes Father   . Hyperlipidemia Father   . Hypertension Father     also kidney disease  . Heart disease Father     CABG, twice  . Stroke Brother   . Diabetes Brother   . Breast cancer  Maternal Aunt     x 4  . Colon cancer Maternal Uncle   . Diabetes Maternal Grandmother   . Colon cancer Maternal Grandfather   . Clotting disorder Paternal Grandmother     blood disorder  . Heart disease Paternal Grandfather   . Von  Willebrand disease Daughter   . Von Willebrand disease Daughter   . Hypertension Son   . Crohn's disease Grandchild   . Clotting disorder Grandchild     x3       Review of Systems As above has a pending back surgery to try to relieve some of her back pain    Objective:   Physical Exam BP 110/68 mmHg  Pulse 96  Ht 5' 0.75" (1.543 m)  Wt 167 lb 8 oz (75.978 kg)  BMI 31.91 kg/m2 NAD Appropriate mooed and affect Eyes anicteric  2011 colonoscopy - adenoma    Assessment & Plan:   Encounter Diagnoses  Name Primary?  . Dyspepsia Yes  . Gastroesophageal reflux disease without esophagitis   . Atypical chest pain   . Hx of adenomatous polyp of colon     Gaviscon prn indigestion upper GI sxs Colonoscopy at hospital after back surgery - she is on home O2 at night The risks and benefits as well as alternatives of endoscopic procedure(s) have been discussed and reviewed. All questions answered. The patient agrees to proceed.  She will call us and schedule when ready does not need an OV can be direct  Cc: Newman Pies, MD Purvis Kilts, MD

## 2015-09-20 NOTE — Patient Instructions (Signed)
  Per Dr Carlean Purl try Gaviscon as needed.   Call us back after your back surgery to set up your colonoscopy.    Consider using trazodone or generic Cymbalta.  Discuss this with Dr Hilma Favors.    I appreciate the opportunity to care for you.

## 2015-09-21 ENCOUNTER — Encounter: Payer: Self-pay | Admitting: Internal Medicine

## 2015-09-28 DIAGNOSIS — D1801 Hemangioma of skin and subcutaneous tissue: Secondary | ICD-10-CM | POA: Diagnosis not present

## 2015-09-28 DIAGNOSIS — D225 Melanocytic nevi of trunk: Secondary | ICD-10-CM | POA: Diagnosis not present

## 2015-09-28 DIAGNOSIS — L821 Other seborrheic keratosis: Secondary | ICD-10-CM | POA: Diagnosis not present

## 2015-09-28 DIAGNOSIS — D485 Neoplasm of uncertain behavior of skin: Secondary | ICD-10-CM | POA: Diagnosis not present

## 2015-09-28 DIAGNOSIS — L570 Actinic keratosis: Secondary | ICD-10-CM | POA: Diagnosis not present

## 2015-09-28 DIAGNOSIS — L812 Freckles: Secondary | ICD-10-CM | POA: Diagnosis not present

## 2015-09-28 DIAGNOSIS — Z85828 Personal history of other malignant neoplasm of skin: Secondary | ICD-10-CM | POA: Diagnosis not present

## 2015-10-04 DIAGNOSIS — E279 Disorder of adrenal gland, unspecified: Secondary | ICD-10-CM | POA: Diagnosis not present

## 2015-10-04 DIAGNOSIS — E7800 Pure hypercholesterolemia, unspecified: Secondary | ICD-10-CM | POA: Diagnosis not present

## 2015-10-05 DIAGNOSIS — L570 Actinic keratosis: Secondary | ICD-10-CM | POA: Diagnosis not present

## 2015-10-07 DIAGNOSIS — D3501 Benign neoplasm of right adrenal gland: Secondary | ICD-10-CM | POA: Diagnosis not present

## 2015-10-07 DIAGNOSIS — M5416 Radiculopathy, lumbar region: Secondary | ICD-10-CM | POA: Diagnosis not present

## 2015-10-07 DIAGNOSIS — E876 Hypokalemia: Secondary | ICD-10-CM | POA: Diagnosis not present

## 2015-10-07 DIAGNOSIS — E039 Hypothyroidism, unspecified: Secondary | ICD-10-CM | POA: Diagnosis not present

## 2015-10-07 DIAGNOSIS — Z1389 Encounter for screening for other disorder: Secondary | ICD-10-CM | POA: Diagnosis not present

## 2015-10-07 DIAGNOSIS — E279 Disorder of adrenal gland, unspecified: Secondary | ICD-10-CM | POA: Diagnosis not present

## 2015-10-07 DIAGNOSIS — E7800 Pure hypercholesterolemia, unspecified: Secondary | ICD-10-CM | POA: Diagnosis not present

## 2015-10-07 DIAGNOSIS — Z6831 Body mass index (BMI) 31.0-31.9, adult: Secondary | ICD-10-CM | POA: Diagnosis not present

## 2015-10-07 DIAGNOSIS — E109 Type 1 diabetes mellitus without complications: Secondary | ICD-10-CM | POA: Diagnosis not present

## 2015-10-13 DIAGNOSIS — M5416 Radiculopathy, lumbar region: Secondary | ICD-10-CM | POA: Diagnosis not present

## 2015-10-13 DIAGNOSIS — E876 Hypokalemia: Secondary | ICD-10-CM | POA: Diagnosis not present

## 2015-10-13 DIAGNOSIS — Z1389 Encounter for screening for other disorder: Secondary | ICD-10-CM | POA: Diagnosis not present

## 2015-10-13 DIAGNOSIS — Z6831 Body mass index (BMI) 31.0-31.9, adult: Secondary | ICD-10-CM | POA: Diagnosis not present

## 2015-10-19 DIAGNOSIS — M4806 Spinal stenosis, lumbar region: Secondary | ICD-10-CM | POA: Diagnosis not present

## 2015-10-28 DIAGNOSIS — Q828 Other specified congenital malformations of skin: Secondary | ICD-10-CM | POA: Diagnosis not present

## 2015-10-28 DIAGNOSIS — M79673 Pain in unspecified foot: Secondary | ICD-10-CM | POA: Diagnosis not present

## 2015-10-28 DIAGNOSIS — M2041 Other hammer toe(s) (acquired), right foot: Secondary | ICD-10-CM | POA: Diagnosis not present

## 2015-11-04 DIAGNOSIS — N342 Other urethritis: Secondary | ICD-10-CM | POA: Diagnosis not present

## 2015-11-04 DIAGNOSIS — E6609 Other obesity due to excess calories: Secondary | ICD-10-CM | POA: Diagnosis not present

## 2015-11-04 DIAGNOSIS — Z1389 Encounter for screening for other disorder: Secondary | ICD-10-CM | POA: Diagnosis not present

## 2015-11-04 DIAGNOSIS — E876 Hypokalemia: Secondary | ICD-10-CM | POA: Diagnosis not present

## 2015-11-04 DIAGNOSIS — Z6831 Body mass index (BMI) 31.0-31.9, adult: Secondary | ICD-10-CM | POA: Diagnosis not present

## 2015-12-20 DIAGNOSIS — E7800 Pure hypercholesterolemia, unspecified: Secondary | ICD-10-CM | POA: Diagnosis not present

## 2015-12-20 DIAGNOSIS — N390 Urinary tract infection, site not specified: Secondary | ICD-10-CM | POA: Diagnosis not present

## 2015-12-23 DIAGNOSIS — M79673 Pain in unspecified foot: Secondary | ICD-10-CM | POA: Diagnosis not present

## 2015-12-23 DIAGNOSIS — Q828 Other specified congenital malformations of skin: Secondary | ICD-10-CM | POA: Diagnosis not present

## 2015-12-23 DIAGNOSIS — M2041 Other hammer toe(s) (acquired), right foot: Secondary | ICD-10-CM | POA: Diagnosis not present

## 2016-02-08 DIAGNOSIS — Z23 Encounter for immunization: Secondary | ICD-10-CM | POA: Diagnosis not present

## 2016-02-08 DIAGNOSIS — M25512 Pain in left shoulder: Secondary | ICD-10-CM | POA: Diagnosis not present

## 2016-02-14 DIAGNOSIS — K219 Gastro-esophageal reflux disease without esophagitis: Secondary | ICD-10-CM | POA: Diagnosis not present

## 2016-02-14 DIAGNOSIS — H9191 Unspecified hearing loss, right ear: Secondary | ICD-10-CM | POA: Diagnosis not present

## 2016-02-14 DIAGNOSIS — R0982 Postnasal drip: Secondary | ICD-10-CM | POA: Diagnosis not present

## 2016-02-14 DIAGNOSIS — H6121 Impacted cerumen, right ear: Secondary | ICD-10-CM | POA: Diagnosis not present

## 2016-02-28 DIAGNOSIS — N10 Acute tubulo-interstitial nephritis: Secondary | ICD-10-CM | POA: Diagnosis not present

## 2016-02-28 DIAGNOSIS — Z6830 Body mass index (BMI) 30.0-30.9, adult: Secondary | ICD-10-CM | POA: Diagnosis not present

## 2016-02-28 DIAGNOSIS — I10 Essential (primary) hypertension: Secondary | ICD-10-CM | POA: Diagnosis not present

## 2016-02-28 DIAGNOSIS — E063 Autoimmune thyroiditis: Secondary | ICD-10-CM | POA: Diagnosis not present

## 2016-02-28 DIAGNOSIS — M1991 Primary osteoarthritis, unspecified site: Secondary | ICD-10-CM | POA: Diagnosis not present

## 2016-02-28 DIAGNOSIS — E6609 Other obesity due to excess calories: Secondary | ICD-10-CM | POA: Diagnosis not present

## 2016-03-02 DIAGNOSIS — M79673 Pain in unspecified foot: Secondary | ICD-10-CM | POA: Diagnosis not present

## 2016-03-02 DIAGNOSIS — I872 Venous insufficiency (chronic) (peripheral): Secondary | ICD-10-CM | POA: Diagnosis not present

## 2016-03-02 DIAGNOSIS — M2041 Other hammer toe(s) (acquired), right foot: Secondary | ICD-10-CM | POA: Diagnosis not present

## 2016-03-02 DIAGNOSIS — Q828 Other specified congenital malformations of skin: Secondary | ICD-10-CM | POA: Diagnosis not present

## 2016-03-06 DIAGNOSIS — H9113 Presbycusis, bilateral: Secondary | ICD-10-CM | POA: Insufficient documentation

## 2016-03-06 DIAGNOSIS — H903 Sensorineural hearing loss, bilateral: Secondary | ICD-10-CM | POA: Diagnosis not present

## 2016-03-06 DIAGNOSIS — M26609 Unspecified temporomandibular joint disorder, unspecified side: Secondary | ICD-10-CM | POA: Diagnosis not present

## 2016-03-06 DIAGNOSIS — M2669 Other specified disorders of temporomandibular joint: Secondary | ICD-10-CM | POA: Insufficient documentation

## 2016-03-09 DIAGNOSIS — M25512 Pain in left shoulder: Secondary | ICD-10-CM | POA: Diagnosis not present

## 2016-03-14 ENCOUNTER — Other Ambulatory Visit: Payer: Self-pay | Admitting: *Deleted

## 2016-03-14 DIAGNOSIS — I83892 Varicose veins of left lower extremities with other complications: Secondary | ICD-10-CM

## 2016-03-15 ENCOUNTER — Ambulatory Visit (HOSPITAL_COMMUNITY)
Admission: RE | Admit: 2016-03-15 | Discharge: 2016-03-15 | Disposition: A | Discharge status: Home / Self Care | Payer: Medicare Other | Source: Ambulatory Visit | Attending: Vascular Surgery | Admitting: Vascular Surgery

## 2016-03-15 DIAGNOSIS — I83892 Varicose veins of left lower extremities with other complications: Secondary | ICD-10-CM | POA: Diagnosis not present

## 2016-03-21 ENCOUNTER — Encounter: Payer: Self-pay | Admitting: Vascular Surgery

## 2016-03-23 ENCOUNTER — Encounter: Payer: Self-pay | Admitting: Vascular Surgery

## 2016-03-23 ENCOUNTER — Ambulatory Visit (INDEPENDENT_AMBULATORY_CARE_PROVIDER_SITE_OTHER): Payer: Medicare Other | Admitting: Vascular Surgery

## 2016-03-23 VITALS — BP 139/83 | HR 103 | Temp 97.2°F | Resp 14 | Ht 61.0 in | Wt 160.0 lb

## 2016-03-23 DIAGNOSIS — L97929 Non-pressure chronic ulcer of unspecified part of left lower leg with unspecified severity: Principal | ICD-10-CM

## 2016-03-23 DIAGNOSIS — I83029 Varicose veins of left lower extremity with ulcer of unspecified site: Secondary | ICD-10-CM

## 2016-03-23 NOTE — Progress Notes (Signed)
Patient ID: Audrey [name], female   DOB: Jun 21, 1940, 75 y.o.   MRN: [id number]  Reason for Consult: New Evaluation (varicose veins)   Referred by Caprice Beaver, DPM  Subjective:     HPI:  Audrey [name] is a 75 y.o. female with history of fibromyalgia as well as history of bilateral lower extremity pain and recently underwent back surgery for the pain. 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 She does not have a history of a blood clot in his never been on blood thinners. 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 She's never had surgery on her bilateral lower extremities. She does have swelling of her bilateral lower extremities which is equivalent between the 2. She is not having erythema or tenderness to palpation of her varicosities at this time. She is been worked up with outpatient ABIs are greater than 1 bilaterally.  Past Medical History:  Diagnosis Date  . Allergic rhinitis   . Anxiety and depression   . Arthritis   . Basal cell carcinoma 2002  . Colon polyp   . Diverticulosis   . Family history of heart disease   . Fibromyalgia   . Gastric polyps   . GERD (gastroesophageal reflux disease)   . History of nuclear stress test 04/2010   nonischemic   . HLD (hyperlipidemia)   . HTN (hypertension)   . Hypothyroidism   . Meniere's disease   . Obesity   . Pinched vertebral nerve   . Raynaud's syndrome   . Right hip pain 08/18/2012  . Sleep apnea    Family History  Problem Relation Age of Onset  . Pancreatic cancer Mother   . Diabetes Mother   . Tuberculosis Mother   . Prostate cancer Father   . Colon polyps Father   . Diabetes Father   . Hyperlipidemia Father   . Hypertension Father     also kidney disease  . Heart disease Father     CABG, twice  .  Stroke Brother   . Diabetes Brother   . Breast cancer Maternal Aunt     x 4  . Colon cancer Maternal Uncle   . Diabetes Maternal Grandmother   . Colon cancer Maternal Grandfather   . Clotting disorder Paternal Grandmother     blood disorder  . Heart disease Paternal Grandfather   . Von Willebrand disease Daughter   . Von Willebrand disease Daughter   . Hypertension Son   . Crohn's disease Grandchild   . Clotting disorder Grandchild     x3   Past Surgical History:  Procedure Laterality Date  . ABDOMINAL HYSTERECTOMY  1982  . BASAL CELL CARCINOMA EXCISION  03-31-01   face  . BLADDER REPAIR  2005   ovaries, tubes removed; pelvic prolapse corrected; rectal repair  . BLADDER SURGERY  01-2009   with mesh placement  . CATARACT EXTRACTION     bilateral  . COLONOSCOPY W/ BIOPSIES AND POLYPECTOMY  12/20/2009   diverticulosis, adenomatous polyps  . ESOPHAGEAL MANOMETRY N/A 05/25/2013   Procedure: ESOPHAGEAL MANOMETRY (EM);  Surgeon: Gatha Mayer, MD;  Location: [facility];  Service: Endoscopy;  Laterality: N/A;  . ESOPHAGOGASTRODUODENOSCOPY (EGD) WITH ESOPHAGEAL DILATION  06/09/2012   Procedure:  ESOPHAGOGASTRODUODENOSCOPY (EGD) WITH ESOPHAGEAL DILATION;  Surgeon: Gatha Mayer, MD;  Location: [facility];  Service: Endoscopy;  Laterality: N/A;  . FOOT SURGERY  1999   bilateral  . INTERSTIM IMPLANT PLACEMENT  01/2012   [facility]  . laser surgery bladder  05-25-10   on mesh  . LIPOMA EXCISION     right arm  . NASOLACRIMAL DUCT PROBING  09-12-10   right eye  . NASOLACRIMAL DUCT PROBING  09-26-10   left eye  . removal of bladder mesh  03-01-11   [facility]  . TONSILLECTOMY  1959  . TRANSTHORACIC ECHOCARDIOGRAM  12/2006   EF=>55%; LA mild-mod dilated; RA mildly dilated; mild mitral annular calcif, mild MR; mod TR & elevated RVSP; mild pulm vavle regurg  . UPPER GASTROINTESTINAL ENDOSCOPY  12/20/2009   GERD    Short Social History:  Social History  Substance Use Topics  . Smoking status: Never  Smoker  . Smokeless tobacco: Never Used  . Alcohol use No    Allergies  Allergen Reactions  . Statins Other (See Comments)    Makes her hurt bad  . Sulfamethoxazole-Trimethoprim Other (See Comments)  . Tramadol Nausea And Vomiting, Other (See Comments) and Nausea Only    Dizziness Medicine has her dizzy and nauseate Dizziness   . Erythromycin Diarrhea  . Other Other (See Comments) and Hives    Allergen - EKG electrodes vytorin  . Bactrim   . Vytorin [Ezetimibe-Simvastatin]   . Adhesive [Tape] Rash    Current Outpatient Prescriptions  Medication Sig Dispense Refill  . alendronate (FOSAMAX) 70 MG tablet Take 70 mg by mouth every 7 (seven) days. Take with a full glass of water on an empty stomach.    . AMBULATORY NON FORMULARY MEDICATION Oxygen @ 2LMP At bedtime    . carboxymethylcellulose (REFRESH TEARS) 0.5 % SOLN Place 1 drop into both eyes every 3 (three) hours.    . cetirizine (ZYRTEC) 10 MG tablet Take 10 mg by mouth as needed for allergies.    . chlordiazePOXIDE (LIBRIUM) 10 MG capsule Take 10 mg by mouth as needed.    . Cholecalciferol (VITAMIN D3) 2000 UNITS TABS Take 2 tablets by mouth daily.    . Coenzyme Q10 200 MG capsule Take 200 mg by mouth daily.    . cyanocobalamin (,VITAMIN B-12,) 1000 MCG/ML injection Inject 1,000 mcg into the muscle every 30 (thirty) days.    Marland Kitchen erythromycin ophthalmic ointment Place 1 application into both eyes every morning.    . furosemide (LASIX) 40 MG tablet 1-2 tablets by mouth once daily    . gabapentin (NEURONTIN) 300 MG capsule Take 600 mg by mouth 3 (three) times daily. One tablet in the morning, one tablet in the afternoon, and 2 tablets in the evening.    Marland Kitchen KRILL OIL PO Take 1 capsule by mouth daily.    Marland Kitchen levothyroxine (SYNTHROID, LEVOTHROID) 50 MCG tablet Take 50 mcg by mouth daily.    Marland Kitchen losartan-hydrochlorothiazide (HYZAAR) 100-25 MG per tablet Take by mouth daily. 1/2 tablet daily    . Milnacipran (SAVELLA) 50 MG TABS Take 50 mg  by mouth 2 (two) times daily.    . pantoprazole (PROTONIX) 40 MG tablet TAKE 1 TABLET BY MOUTH DAILY BEFORE BREAKFAST AND 1 TABLET BY MOUTH DAILY BEFORE DINNER 60 tablet 0  . potassium chloride SA (K-DUR,KLOR-CON) 20 MEQ tablet Take 2 tablets by mouth every morning and 2 tablets by mouth every evening    . fluocinonide cream (LIDEX) 0.05 % Apply  1 application topically 2 (two) times daily. 2 weeks of using cream; 2 weeks of not using cream     No current facility-administered medications for this visit.     Review of Systems  Constitutional:  Constitutional negative. HENT: HENT negative.  Eyes: Eyes negative.  Respiratory: Respiratory negative.  Cardiovascular: Cardiovascular negative.  GI: Gastrointestinal negative.  Musculoskeletal: Positive for back pain, leg pain, joint pain and myalgias.  Skin:       Darkening of skin left leg Neurological: Neurological negative. Hematologic: Hematologic/lymphatic negative.  Psychiatric: Psychiatric negative.        Objective:  Objective   Vitals:   03/23/16 1320  BP: 139/83  Pulse: (!) 103  Resp: 14  Temp: 97.2 F (36.2 C)  SpO2: 99%  Weight: 160 lb (72.6 kg)  Height: 5\' 1"  (1.549 m)   Body mass index is 30.23 kg/m.  Physical Exam  Constitutional: She is oriented to person, place, and time. She appears well-developed and well-nourished.  HENT:  Head: Normocephalic and atraumatic.  Eyes: EOM are normal.  Neck: Neck supple. No thyromegaly present.  Cardiovascular: Normal rate.   Pulses:      Radial pulses are 2+ on the right side, and 2+ on the left side.       Popliteal pulses are 2+ on the right side, and 2+ on the left side.       Dorsalis pedis pulses are 2+ on the right side, and 2+ on the left side.       Posterior tibial pulses are 2+ on the right side, and 2+ on the left side.  Pulmonary/Chest: Effort normal.  Abdominal: Soft. She exhibits no mass.  Musculoskeletal: Normal range of motion. She exhibits edema and  tenderness. She exhibits no deformity.  Lymphadenopathy:    She has no cervical adenopathy.  Neurological: She is alert and oriented to person, place, and time.  Skin: Skin is warm and dry.  Psychiatric: She has a normal mood and affect. Her behavior is normal. Judgment and thought content normal.    Data: Reflux noted in the small saphenous vein from the thigh to the level of the knee. There are varicosities around the level of the knee. Small saphenous vein years origin is 0.73 cm in diameter.     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 Given the skin changes at the left leg I think she is a suitable candidate for consideration of vein ablation and phlebectomy of her varicosities. She has been prescribed thigh-high compression stockings in our office today and she will follow up in 3 months with either Dr. Kellie Simmering or Dr. Donnetta Hutching for consideration of ablation.     Waynetta Sandy MD [facility]

## 2016-04-04 DIAGNOSIS — L814 Other melanin hyperpigmentation: Secondary | ICD-10-CM | POA: Diagnosis not present

## 2016-04-04 DIAGNOSIS — L309 Dermatitis, unspecified: Secondary | ICD-10-CM | POA: Diagnosis not present

## 2016-04-04 DIAGNOSIS — L810 Postinflammatory hyperpigmentation: Secondary | ICD-10-CM | POA: Diagnosis not present

## 2016-04-04 DIAGNOSIS — D485 Neoplasm of uncertain behavior of skin: Secondary | ICD-10-CM | POA: Diagnosis not present

## 2016-04-04 DIAGNOSIS — D2272 Melanocytic nevi of left lower limb, including hip: Secondary | ICD-10-CM | POA: Diagnosis not present

## 2016-04-04 DIAGNOSIS — L570 Actinic keratosis: Secondary | ICD-10-CM | POA: Diagnosis not present

## 2016-04-04 DIAGNOSIS — L821 Other seborrheic keratosis: Secondary | ICD-10-CM | POA: Diagnosis not present

## 2016-04-04 DIAGNOSIS — D1801 Hemangioma of skin and subcutaneous tissue: Secondary | ICD-10-CM | POA: Diagnosis not present

## 2016-04-04 DIAGNOSIS — Z85828 Personal history of other malignant neoplasm of skin: Secondary | ICD-10-CM | POA: Diagnosis not present

## 2016-04-17 DIAGNOSIS — N390 Urinary tract infection, site not specified: Secondary | ICD-10-CM | POA: Diagnosis not present

## 2016-04-17 DIAGNOSIS — N3941 Urge incontinence: Secondary | ICD-10-CM | POA: Diagnosis not present

## 2016-04-17 DIAGNOSIS — N952 Postmenopausal atrophic vaginitis: Secondary | ICD-10-CM | POA: Diagnosis not present

## 2016-04-17 DIAGNOSIS — N3281 Overactive bladder: Secondary | ICD-10-CM | POA: Diagnosis not present

## 2016-04-17 DIAGNOSIS — T83711D Erosion of implanted vaginal mesh and other prosthetic materials to surrounding organ or tissue, subsequent encounter: Secondary | ICD-10-CM | POA: Diagnosis not present

## 2016-05-23 DIAGNOSIS — L570 Actinic keratosis: Secondary | ICD-10-CM | POA: Diagnosis not present

## 2016-05-29 DIAGNOSIS — E063 Autoimmune thyroiditis: Secondary | ICD-10-CM | POA: Diagnosis not present

## 2016-05-29 DIAGNOSIS — E538 Deficiency of other specified B group vitamins: Secondary | ICD-10-CM | POA: Diagnosis not present

## 2016-05-29 DIAGNOSIS — H81393 Other peripheral vertigo, bilateral: Secondary | ICD-10-CM | POA: Diagnosis not present

## 2016-05-29 DIAGNOSIS — Z1389 Encounter for screening for other disorder: Secondary | ICD-10-CM | POA: Diagnosis not present

## 2016-05-29 DIAGNOSIS — H6692 Otitis media, unspecified, left ear: Secondary | ICD-10-CM | POA: Diagnosis not present

## 2016-05-29 DIAGNOSIS — Z6831 Body mass index (BMI) 31.0-31.9, adult: Secondary | ICD-10-CM | POA: Diagnosis not present

## 2016-05-29 DIAGNOSIS — J329 Chronic sinusitis, unspecified: Secondary | ICD-10-CM | POA: Diagnosis not present

## 2016-06-01 DIAGNOSIS — Q828 Other specified congenital malformations of skin: Secondary | ICD-10-CM | POA: Diagnosis not present

## 2016-06-01 DIAGNOSIS — M79673 Pain in unspecified foot: Secondary | ICD-10-CM | POA: Diagnosis not present

## 2016-06-01 DIAGNOSIS — M2041 Other hammer toe(s) (acquired), right foot: Secondary | ICD-10-CM | POA: Diagnosis not present

## 2016-06-11 ENCOUNTER — Other Ambulatory Visit: Payer: Self-pay | Admitting: Internal Medicine

## 2016-06-19 ENCOUNTER — Encounter: Payer: Self-pay | Admitting: Vascular Surgery

## 2016-06-26 ENCOUNTER — Ambulatory Visit (INDEPENDENT_AMBULATORY_CARE_PROVIDER_SITE_OTHER): Payer: Medicare Other | Admitting: Vascular Surgery

## 2016-06-26 ENCOUNTER — Other Ambulatory Visit: Payer: Self-pay | Admitting: *Deleted

## 2016-06-26 ENCOUNTER — Encounter: Payer: Self-pay | Admitting: Vascular Surgery

## 2016-06-26 VITALS — BP 136/88 | HR 95 | Temp 98.0°F | Resp 16 | Ht 61.0 in | Wt 163.0 lb

## 2016-06-26 DIAGNOSIS — I83892 Varicose veins of left lower extremities with other complications: Secondary | ICD-10-CM | POA: Diagnosis not present

## 2016-06-26 DIAGNOSIS — I83812 Varicose veins of left lower extremities with pain: Secondary | ICD-10-CM

## 2016-06-26 NOTE — Progress Notes (Signed)
Subjective:     Patient ID: Audrey [name], female   DOB: 1940-07-12, 76 y.o.   MRN: [id number]  HPI 27 76 year old female returns for further follow-up regarding her painful varicosities in the left leg. She was evaluated by Dr. Donzetta Matters area she has had previous back surgery which relieved some pain in the left leg which she continues to have aching throbbing and burning discomfort in the lower thigh and calf area and thickening and Artemis of the skin in the left leg. She has no history of ulceration bleeding or complication such as DVT. She has tried long-leg elastic compression stockings but they did not improve her symptoms. She had difficulty with the stockings staying up and they cause "creases" at the knee level increasing her discomfort. This is affecting her daily living and she is interested in treatment to help relieve the symptoms.  Past Medical History:  Diagnosis Date  . Allergic rhinitis   . Anxiety and depression   . Arthritis   . Basal cell carcinoma 2002  . Colon polyp   . Diverticulosis   . Family history of heart disease   . Fibromyalgia   . Gastric polyps   . GERD (gastroesophageal reflux disease)   . History of nuclear stress test 04/2010   nonischemic   . HLD (hyperlipidemia)   . HTN (hypertension)   . Hypothyroidism   . Meniere's disease   . Obesity   . Pinched vertebral nerve   . Raynaud's syndrome   . Right hip pain 08/18/2012  . Sleep apnea     Social History  Substance Use Topics  . Smoking status: Never Smoker  . Smokeless tobacco: Never Used  . Alcohol use No    Family History  Problem Relation Age of Onset  . Pancreatic cancer Mother   . Diabetes Mother   . Tuberculosis Mother   . Prostate cancer Father   . Colon polyps Father   . Diabetes Father   . Hyperlipidemia Father   . Hypertension Father     also kidney disease  . Heart disease Father     CABG, twice  . Diabetes Maternal Grandmother   . Colon cancer Maternal Grandfather   .  Clotting disorder Paternal Grandmother     blood disorder  . Heart disease Paternal Grandfather   . Stroke Brother   . Diabetes Brother   . Breast cancer Maternal Aunt     x 4  . Colon cancer Maternal Uncle   . Von Willebrand disease Daughter   . Von Willebrand disease Daughter   . Hypertension Son   . Crohn's disease Grandchild   . Clotting disorder Grandchild     x3    Allergies  Allergen Reactions  . Statins Other (See Comments)    Makes her hurt bad  . Sulfamethoxazole-Trimethoprim Other (See Comments)  . Tramadol Nausea And Vomiting, Other (See Comments) and Nausea Only    Dizziness Medicine has her dizzy and nauseate Dizziness   . Erythromycin Diarrhea  . Other Other (See Comments) and Hives    Allergen - EKG electrodes vytorin  . Bactrim   . Vytorin [Ezetimibe-Simvastatin]   . Adhesive [Tape] Rash     Current Outpatient Prescriptions:  .  alendronate (FOSAMAX) 70 MG tablet, Take 70 mg by mouth every 7 (seven) days. Take with a full glass of water on an empty stomach., Disp: , Rfl:  .  AMBULATORY NON FORMULARY MEDICATION, Oxygen @ 2LMP At bedtime, Disp: , Rfl:  .  carboxymethylcellulose (REFRESH TEARS) 0.5 % SOLN, Place 1 drop into both eyes every 3 (three) hours., Disp: , Rfl:  .  cetirizine (ZYRTEC) 10 MG tablet, Take 10 mg by mouth as needed for allergies., Disp: , Rfl:  .  chlordiazePOXIDE (LIBRIUM) 10 MG capsule, Take 10 mg by mouth as needed., Disp: , Rfl:  .  Cholecalciferol (VITAMIN D3) 2000 UNITS TABS, Take 2 tablets by mouth daily., Disp: , Rfl:  .  Coenzyme Q10 200 MG capsule, Take 200 mg by mouth daily., Disp: , Rfl:  .  cyanocobalamin (,VITAMIN B-12,) 1000 MCG/ML injection, Inject 1,000 mcg into the muscle every 30 (thirty) days., Disp: , Rfl:  .  erythromycin ophthalmic ointment, Place 1 application into both eyes every morning., Disp: , Rfl:  .  fluocinonide cream (LIDEX) [id number] %, Apply 1 application topically 2 (two) times daily. 2 weeks of using  cream; 2 weeks of not using cream, Disp: , Rfl:  .  furosemide (LASIX) 40 MG tablet, 1-2 tablets by mouth once daily, Disp: , Rfl:  .  gabapentin (NEURONTIN) 300 MG capsule, Take 600 mg by mouth 3 (three) times daily. One tablet in the morning, one tablet in the afternoon, and 2 tablets in the evening., Disp: , Rfl:  .  KRILL OIL PO, Take 1 capsule by mouth daily., Disp: , Rfl:  .  levothyroxine (SYNTHROID, LEVOTHROID) 50 MCG tablet, Take 50 mcg by mouth daily., Disp: , Rfl:  .  losartan-hydrochlorothiazide (HYZAAR) 100-25 MG per tablet, Take by mouth daily. 1/2 tablet daily, Disp: , Rfl:  .  Milnacipran (SAVELLA) 50 MG TABS, Take 50 mg by mouth 2 (two) times daily., Disp: , Rfl:  .  nitrofurantoin, macrocrystal-monohydrate, (MACROBID) 100 MG capsule, Take by mouth., Disp: , Rfl:  .  pantoprazole (PROTONIX) 40 MG tablet, TAKE 1 TABLET BY MOUTH DAILY BEFORE BREAKFAST AND TAKE 1 TABLET DAILY BEFORE DINNER, Disp: 60 tablet, Rfl: 0 .  potassium chloride SA (K-DUR,KLOR-CON) 20 MEQ tablet, Take 2 tablets by mouth every morning and 2 tablets by mouth every evening, Disp: , Rfl:   Vitals:   06/26/16 1107  BP: 136/88  Pulse: 95  Resp: 16  Temp: 98 F (36.7 C)  TempSrc: Oral  SpO2: 96%  Weight: 163 lb (73.9 kg)  Height: 5\' 1"  (1.549 m)    Body mass index is 30.8 kg/m.           Review of Systems S pain, dyspnea on exertion, PND, orthopnea, hemoptysis    Objective:   Physical Exam BP 136/88 (BP Location: Left Arm, Patient Position: Sitting, Cuff Size: Normal)   Pulse 95   Temp 98 F (36.7 C) (Oral)   Resp 16   Ht 5\' 1"  (1.549 m)   Wt 163 lb (73.9 kg)   SpO2 96%   BMI 30.80 kg/m   Well-developed well-nourished female no apparent distress alert and oriented 3 Lungs no rhonchi or wheezing Exline cardiovascular regular rhythm no murmurs Left leg with bulging varicosities in the posterior calf and into the medial below-knee calf over the great saphenous system. Significant  hyperpigmentation and tightening of the scan lower third left leg with no active ulceration. 2+ dorsalis pedis pulse palpable.  I reviewed the reflux exam of the left leg which was performed October 26 in our office. 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 Symptoms are resistant to conservative measures including long-leg elastic compression stockings 20-30 millimeter gradient, elevation, and ibuprofen and are affecting patient's daily living. Patient also has significant skin changes-CEAP 3    Plan:     Patient needs laser ablation left small saphenous vein followed by a waiting. 2 then be evaluated for stab phlebectomy of painful varicosities We will proceed with precertification to perform this in the near future to relieve her symptoms and stabilizer skin changes

## 2016-06-29 DIAGNOSIS — M5410 Radiculopathy, site unspecified: Secondary | ICD-10-CM | POA: Diagnosis not present

## 2016-06-29 DIAGNOSIS — Z1231 Encounter for screening mammogram for malignant neoplasm of breast: Secondary | ICD-10-CM | POA: Diagnosis not present

## 2016-07-11 DIAGNOSIS — E7800 Pure hypercholesterolemia, unspecified: Secondary | ICD-10-CM | POA: Diagnosis not present

## 2016-07-11 DIAGNOSIS — E559 Vitamin D deficiency, unspecified: Secondary | ICD-10-CM | POA: Diagnosis not present

## 2016-07-11 DIAGNOSIS — D3501 Benign neoplasm of right adrenal gland: Secondary | ICD-10-CM | POA: Diagnosis not present

## 2016-07-11 DIAGNOSIS — E876 Hypokalemia: Secondary | ICD-10-CM | POA: Diagnosis not present

## 2016-07-11 DIAGNOSIS — M797 Fibromyalgia: Secondary | ICD-10-CM | POA: Diagnosis not present

## 2016-07-11 DIAGNOSIS — R252 Cramp and spasm: Secondary | ICD-10-CM | POA: Diagnosis not present

## 2016-07-11 DIAGNOSIS — I10 Essential (primary) hypertension: Secondary | ICD-10-CM | POA: Diagnosis not present

## 2016-07-11 DIAGNOSIS — E039 Hypothyroidism, unspecified: Secondary | ICD-10-CM | POA: Diagnosis not present

## 2016-07-11 DIAGNOSIS — E785 Hyperlipidemia, unspecified: Secondary | ICD-10-CM | POA: Diagnosis not present

## 2016-07-14 ENCOUNTER — Other Ambulatory Visit: Payer: Self-pay | Admitting: Internal Medicine

## 2016-08-07 ENCOUNTER — Encounter: Payer: Self-pay | Admitting: Vascular Surgery

## 2016-08-07 ENCOUNTER — Ambulatory Visit (INDEPENDENT_AMBULATORY_CARE_PROVIDER_SITE_OTHER): Payer: Medicare Other | Admitting: Vascular Surgery

## 2016-08-07 VITALS — BP 129/82 | HR 80 | Temp 97.0°F | Resp 18 | Ht 61.0 in | Wt 163.0 lb

## 2016-08-07 DIAGNOSIS — I83892 Varicose veins of left lower extremities with other complications: Secondary | ICD-10-CM

## 2016-08-07 HISTORY — PX: ENDOVENOUS ABLATION SAPHENOUS VEIN W/ LASER: SUR449

## 2016-08-07 NOTE — Progress Notes (Signed)
Subjective:     Patient ID: Audrey [name], female   DOB: June 01, 1940, 76 y.o.   MRN: [id number]  HPI This 76 year old female had laser ablation of the left small saphenous vein performed under local tumescent anesthesia. A total of the proximal and 1300 J of energy was utilized. She tolerated the procedure well. The vein did not communicate with the popliteal vein but extended up the posterior thigh toward the great saphenous vein.  Review of Systems     Objective:   Physical Exam BP 129/82 (BP Location: Left Arm, Patient Position: Sitting, Cuff Size: Normal)   Pulse 80   Temp 97 F (36.1 C) (Oral)   Resp 18   Ht 5\' 1"  (1.549 m)   Wt 163 lb (73.9 kg)   SpO2 98%   BMI 30.80 kg/m        Assessment:     Well-tolerated laser ablation left small saphenous vein performed under local tumescent anesthesia    Plan:     Return in 1 week for venous duplex exam to confirm closure left small saphenous vein

## 2016-08-07 NOTE — Progress Notes (Signed)
Laser Ablation Procedure    Date: 08/07/2016   Audrey [name] DOB:May 16, 1941  Consent signed: Yes    Surgeon:  Dr. Nelda Severe. Kellie Simmering  Procedure: Laser Ablation: left Small Saphenous Vein  BP 129/82 (BP Location: Left Arm, Patient Position: Sitting, Cuff Size: Normal)   Pulse 80   Temp 97 F (36.1 C) (Oral)   Resp 18   Ht 5\' 1"  (1.549 m)   Wt 163 lb (73.9 kg)   SpO2 98%   BMI 30.80 kg/m   Tumescent Anesthesia: 250 cc 0.9% NaCl with 50 cc Lidocaine HCL with 1% Epi and 15 cc 8.4% NaHCO3  Local Anesthesia: 3 cc Lidocaine HCL and NaHCO3 (ratio 2:1)  Pulsed Mode: 15 watts, 533ms delay, 1.0 duration  Total Energy:   1380 Joules           Total Pulses: 93               Total Time: 1:32       Patient tolerated procedure well    Description of Procedure:  After marking the course of the secondary varicosities, the patient was placed on the operating table in the prone position, and the left leg was prepped and draped in sterile fashion.   Local anesthetic was administered and under ultrasound guidance the saphenous vein was accessed with a micro needle and guide wire; then the mirco puncture sheath was placed.  A guide wire was inserted saphenopopliteal junction , followed by a 5 french sheath.  The position of the sheath and then the laser fiber below the junction was confirmed using the ultrasound.  Tumescent anesthesia was administered along the course of the saphenous vein using ultrasound guidance. The patient was placed in Trendelenburg position and protective laser glasses were placed on patient and staff, and the laser was fired at 15 watts continuous mode advancing 1-31mm/second for a total of 1380 joules.     Steri strip wasapplied to IV insertion site and ABD pads and thigh high compression stockings were applied.  Ace wrap bandages were applied over calf and thigh and at the top of the saphenopopliteal junction. Blood loss was less than 15 cc.  The patient ambulated out of the  operating room having tolerated the procedure well.

## 2016-08-14 ENCOUNTER — Encounter: Payer: Self-pay | Admitting: Vascular Surgery

## 2016-08-14 ENCOUNTER — Ambulatory Visit (INDEPENDENT_AMBULATORY_CARE_PROVIDER_SITE_OTHER): Payer: Medicare Other | Admitting: Vascular Surgery

## 2016-08-14 ENCOUNTER — Ambulatory Visit (HOSPITAL_COMMUNITY)
Admission: RE | Admit: 2016-08-14 | Discharge: 2016-08-14 | Disposition: A | Discharge status: Home / Self Care | Payer: Medicare Other | Source: Ambulatory Visit | Attending: Vascular Surgery | Admitting: Vascular Surgery

## 2016-08-14 VITALS — BP 144/78 | HR 86 | Temp 96.8°F | Resp 14 | Ht 61.0 in | Wt 163.0 lb

## 2016-08-14 DIAGNOSIS — I83892 Varicose veins of left lower extremities with other complications: Secondary | ICD-10-CM

## 2016-08-14 DIAGNOSIS — I83812 Varicose veins of left lower extremities with pain: Secondary | ICD-10-CM | POA: Diagnosis not present

## 2016-08-14 NOTE — Progress Notes (Signed)
Subjective:     Patient ID: Audrey [name], female   DOB: 25-Nov-1940, 76 y.o.   MRN: [id number]  HPI This 76 year old female returns 1 week post-laser ablation of the left small saphenous vein for painful varicosities and swelling. She has had minimal discomfort following her ablation. She did take ibuprofen as instructed and where her long leg elastic compression stocking. She states that the leg feels less tight and that the bulges are less noticeable but still slightly uncomfortable.  Past Medical History:  Diagnosis Date  . Allergic rhinitis   . Anxiety and depression   . Arthritis   . Basal cell carcinoma 2002  . Colon polyp   . Diverticulosis   . Family history of heart disease   . Fibromyalgia   . Gastric polyps   . GERD (gastroesophageal reflux disease)   . History of nuclear stress test 04/2010   nonischemic   . HLD (hyperlipidemia)   . HTN (hypertension)   . Hypothyroidism   . Meniere's disease   . Obesity   . Pinched vertebral nerve   . Raynaud's syndrome   . Right hip pain 08/18/2012  . Sleep apnea     Social History  Substance Use Topics  . Smoking status: Never Smoker  . Smokeless tobacco: Never Used  . Alcohol use No    Family History  Problem Relation Age of Onset  . Pancreatic cancer Mother   . Diabetes Mother   . Tuberculosis Mother   . Prostate cancer Father   . Colon polyps Father   . Diabetes Father   . Hyperlipidemia Father   . Hypertension Father     also kidney disease  . Heart disease Father     CABG, twice  . Diabetes Maternal Grandmother   . Colon cancer Maternal Grandfather   . Clotting disorder Paternal Grandmother     blood disorder  . Heart disease Paternal Grandfather   . Stroke Brother   . Diabetes Brother   . Breast cancer Maternal Aunt     x 4  . Colon cancer Maternal Uncle   . Von Willebrand disease Daughter   . Von Willebrand disease Daughter   . Hypertension Son   . Crohn's disease Grandchild   . Clotting disorder  Grandchild     x3    Allergies  Allergen Reactions  . Statins Other (See Comments)    Makes her hurt bad Makes her hurt bad  . Sulfa Antibiotics Rash  . Sulfamethoxazole-Trimethoprim Other (See Comments)  . Tramadol Nausea And Vomiting, Other (See Comments) and Nausea Only    Dizziness Medicine has her dizzy and nauseate Dizziness   . Erythromycin Diarrhea  . Other Other (See Comments) and Hives    Allergen - EKG electrodes vytorin vytorin Allergen - EKG electrodes vytorin  . Bactrim   . Vytorin [Ezetimibe-Simvastatin]   . Adhesive [Tape] Rash     Current Outpatient Prescriptions:  .  alendronate (FOSAMAX) 70 MG tablet, Take 70 mg by mouth every 7 (seven) days. Take with a full glass of water on an empty stomach., Disp: , Rfl:  .  AMBULATORY NON FORMULARY MEDICATION, Oxygen @ 2LMP At bedtime, Disp: , Rfl:  .  carboxymethylcellulose (REFRESH TEARS) 0.5 % SOLN, Place 1 drop into both eyes every 3 (three) hours., Disp: , Rfl:  .  cetirizine (ZYRTEC) 10 MG tablet, Take 10 mg by mouth as needed for allergies., Disp: , Rfl:  .  chlordiazePOXIDE (LIBRIUM) 10 MG capsule, Take 10 mg  by mouth as needed., Disp: , Rfl:  .  Cholecalciferol (VITAMIN D3) 2000 UNITS TABS, Take 2 tablets by mouth daily., Disp: , Rfl:  .  Coenzyme Q10 200 MG capsule, Take 200 mg by mouth daily., Disp: , Rfl:  .  cyanocobalamin (,VITAMIN B-12,) 1000 MCG/ML injection, Inject 1,000 mcg into the muscle every 30 (thirty) days., Disp: , Rfl:  .  erythromycin ophthalmic ointment, Place 1 application into both eyes every morning., Disp: , Rfl:  .  fluocinonide cream (LIDEX) 4.68 %, Apply 1 application topically 2 (two) times daily. 2 weeks of using cream; 2 weeks of not using cream, Disp: , Rfl:  .  furosemide (LASIX) 40 MG tablet, 1-2 tablets by mouth once daily, Disp: , Rfl:  .  gabapentin (NEURONTIN) 300 MG capsule, Take 600 mg by mouth 3 (three) times daily. One tablet in the morning, one tablet in the afternoon,  and 2 tablets in the evening., Disp: , Rfl:  .  KRILL OIL PO, Take 1 capsule by mouth daily., Disp: , Rfl:  .  levothyroxine (SYNTHROID, LEVOTHROID) 50 MCG tablet, Take 50 mcg by mouth daily., Disp: , Rfl:  .  losartan-hydrochlorothiazide (HYZAAR) 100-25 MG per tablet, Take by mouth daily. 1/2 tablet daily, Disp: , Rfl:  .  Milnacipran (SAVELLA) 50 MG TABS, Take 50 mg by mouth 2 (two) times daily., Disp: , Rfl:  .  nitrofurantoin, macrocrystal-monohydrate, (MACROBID) 100 MG capsule, Take by mouth., Disp: , Rfl:  .  pantoprazole (PROTONIX) 40 MG tablet, TAKE 1 TABLET BY MOUTH DAILY BEFORE BREAKFAST AND TAKE 1 TABLET DAILY BEFORE DINNER, Disp: 60 tablet, Rfl: 0 .  potassium chloride SA (K-DUR,KLOR-CON) 20 MEQ tablet, Take 2 tablets by mouth every morning and 2 tablets by mouth every evening, Disp: , Rfl:   Vitals:   08/14/16 1426  BP: (!) 144/78  Pulse: 86  Resp: 14  Temp: (!) 96.8 F (36 C)  SpO2: 98%  Weight: 163 lb (73.9 kg)  Height: 5\' 1"  (1.549 m)    Body mass index is 30.8 kg/m.         Review of Systems Denies chest pain, dyspnea on exertion, PND, orthopnea, hemoptysis, claudication    Objective:   Physical Exam BP (!) 144/78 (BP Location: Left Arm, Patient Position: Sitting, Cuff Size: Normal)   Pulse 86   Temp (!) 96.8 F (36 C)   Resp 14   Ht 5\' 1"  (1.549 m)   Wt 163 lb (73.9 kg)   SpO2 98%   BMI 30.80 kg/m   Gen. well-developed well-nourished female no apparent distress alert and oriented 3 Lungs no rhonchi or wheezing Cardiovascular regular rhythm no murmurs Left leg with mild discomfort to deep palpation over small saphenous vein. Minimal distal edema noted. Chronic hyperpigmentation lower third left leg with no active ulcer. 2+ dorsalis pedis pulse palpable.  Today I ordered a venous duplex exam the left leg which I reviewed and interpreted. There is no DVT. There is total closure of the small saphenous vein up to near the saphenofemoral popliteal  junction     Assessment:     Successful laser ablation left small saphenous vein for painful varicosities and swelling    Plan:     Return in 3 months for evaluation for possible stab phlebectomy of residual varicosities

## 2016-08-20 ENCOUNTER — Other Ambulatory Visit: Payer: Medicare Other | Admitting: Vascular Surgery

## 2016-08-21 DIAGNOSIS — K219 Gastro-esophageal reflux disease without esophagitis: Secondary | ICD-10-CM | POA: Diagnosis not present

## 2016-08-21 DIAGNOSIS — E782 Mixed hyperlipidemia: Secondary | ICD-10-CM | POA: Diagnosis not present

## 2016-08-21 DIAGNOSIS — E538 Deficiency of other specified B group vitamins: Secondary | ICD-10-CM | POA: Diagnosis not present

## 2016-08-21 DIAGNOSIS — Z1389 Encounter for screening for other disorder: Secondary | ICD-10-CM | POA: Diagnosis not present

## 2016-08-21 DIAGNOSIS — Z6831 Body mass index (BMI) 31.0-31.9, adult: Secondary | ICD-10-CM | POA: Diagnosis not present

## 2016-08-21 DIAGNOSIS — Z0000 Encounter for general adult medical examination without abnormal findings: Secondary | ICD-10-CM | POA: Diagnosis not present

## 2016-08-21 DIAGNOSIS — M1991 Primary osteoarthritis, unspecified site: Secondary | ICD-10-CM | POA: Diagnosis not present

## 2016-08-24 ENCOUNTER — Other Ambulatory Visit (HOSPITAL_COMMUNITY): Payer: Self-pay | Admitting: Internal Medicine

## 2016-08-24 DIAGNOSIS — L97511 Non-pressure chronic ulcer of other part of right foot limited to breakdown of skin: Secondary | ICD-10-CM | POA: Diagnosis not present

## 2016-08-27 ENCOUNTER — Encounter (HOSPITAL_COMMUNITY): Payer: Medicare Other

## 2016-08-27 ENCOUNTER — Ambulatory Visit: Payer: Medicare Other | Admitting: Vascular Surgery

## 2016-08-31 DIAGNOSIS — L97511 Non-pressure chronic ulcer of other part of right foot limited to breakdown of skin: Secondary | ICD-10-CM | POA: Diagnosis not present

## 2016-09-11 DIAGNOSIS — Z780 Asymptomatic menopausal state: Secondary | ICD-10-CM | POA: Diagnosis not present

## 2016-09-11 DIAGNOSIS — M8589 Other specified disorders of bone density and structure, multiple sites: Secondary | ICD-10-CM | POA: Diagnosis not present

## 2016-09-27 DIAGNOSIS — L821 Other seborrheic keratosis: Secondary | ICD-10-CM | POA: Diagnosis not present

## 2016-09-27 DIAGNOSIS — Z85828 Personal history of other malignant neoplasm of skin: Secondary | ICD-10-CM | POA: Diagnosis not present

## 2016-09-27 DIAGNOSIS — L570 Actinic keratosis: Secondary | ICD-10-CM | POA: Diagnosis not present

## 2016-09-27 DIAGNOSIS — L814 Other melanin hyperpigmentation: Secondary | ICD-10-CM | POA: Diagnosis not present

## 2016-09-27 DIAGNOSIS — D1801 Hemangioma of skin and subcutaneous tissue: Secondary | ICD-10-CM | POA: Diagnosis not present

## 2016-09-28 DIAGNOSIS — L97511 Non-pressure chronic ulcer of other part of right foot limited to breakdown of skin: Secondary | ICD-10-CM | POA: Diagnosis not present

## 2016-10-10 DIAGNOSIS — E876 Hypokalemia: Secondary | ICD-10-CM | POA: Diagnosis not present

## 2016-10-10 DIAGNOSIS — E7800 Pure hypercholesterolemia, unspecified: Secondary | ICD-10-CM | POA: Diagnosis not present

## 2016-10-10 DIAGNOSIS — D3501 Benign neoplasm of right adrenal gland: Secondary | ICD-10-CM | POA: Diagnosis not present

## 2016-10-12 DIAGNOSIS — D519 Vitamin B12 deficiency anemia, unspecified: Secondary | ICD-10-CM | POA: Diagnosis not present

## 2016-10-16 ENCOUNTER — Other Ambulatory Visit: Payer: Self-pay | Admitting: Internal Medicine

## 2016-10-26 DIAGNOSIS — M79674 Pain in right toe(s): Secondary | ICD-10-CM | POA: Diagnosis not present

## 2016-10-26 DIAGNOSIS — L97519 Non-pressure chronic ulcer of other part of right foot with unspecified severity: Secondary | ICD-10-CM | POA: Diagnosis not present

## 2016-10-26 DIAGNOSIS — M2041 Other hammer toe(s) (acquired), right foot: Secondary | ICD-10-CM | POA: Diagnosis not present

## 2016-10-30 DIAGNOSIS — E063 Autoimmune thyroiditis: Secondary | ICD-10-CM | POA: Diagnosis not present

## 2016-10-30 DIAGNOSIS — M1991 Primary osteoarthritis, unspecified site: Secondary | ICD-10-CM | POA: Diagnosis not present

## 2016-10-30 DIAGNOSIS — I10 Essential (primary) hypertension: Secondary | ICD-10-CM | POA: Diagnosis not present

## 2016-10-30 DIAGNOSIS — Z6831 Body mass index (BMI) 31.0-31.9, adult: Secondary | ICD-10-CM | POA: Diagnosis not present

## 2016-10-30 DIAGNOSIS — E669 Obesity, unspecified: Secondary | ICD-10-CM | POA: Diagnosis not present

## 2016-10-30 DIAGNOSIS — D680 Von Willebrand's disease: Secondary | ICD-10-CM | POA: Diagnosis not present

## 2016-10-30 DIAGNOSIS — G4730 Sleep apnea, unspecified: Secondary | ICD-10-CM | POA: Diagnosis not present

## 2016-10-30 DIAGNOSIS — K219 Gastro-esophageal reflux disease without esophagitis: Secondary | ICD-10-CM | POA: Diagnosis not present

## 2016-10-30 DIAGNOSIS — F419 Anxiety disorder, unspecified: Secondary | ICD-10-CM | POA: Diagnosis not present

## 2016-10-31 ENCOUNTER — Encounter: Payer: Self-pay | Admitting: Vascular Surgery

## 2016-11-13 ENCOUNTER — Ambulatory Visit (INDEPENDENT_AMBULATORY_CARE_PROVIDER_SITE_OTHER): Payer: Medicare Other | Admitting: Vascular Surgery

## 2016-11-13 ENCOUNTER — Encounter: Payer: Self-pay | Admitting: Vascular Surgery

## 2016-11-13 VITALS — BP 141/79 | HR 91 | Temp 96.9°F | Resp 14 | Ht 61.0 in | Wt 160.0 lb

## 2016-11-13 DIAGNOSIS — I83892 Varicose veins of left lower extremities with other complications: Secondary | ICD-10-CM

## 2016-11-13 NOTE — Progress Notes (Signed)
Subjective:     Patient ID: Audrey [name], female   DOB: 10-Sep-1940, 76 y.o.   MRN: [id number]  HPI This 76 year old female returns for 3 month follow-up regarding her painful varicosities in the left leg. She underwent laser ablation of the left small saphenous vein in March 2018. She has worn her long leg elastic compression stocking and tried elevation and ibuprofen continues to have discomfort in the left leg. She has noticed some hyperpigmentation in the lower third of the left leg which has occurred over the past month. Swelling in the foot has remained about the same. She has no symptoms and contralateral right leg.  Past Medical History:  Diagnosis Date  . Allergic rhinitis   . Anxiety and depression   . Arthritis   . Basal cell carcinoma 2002  . Colon polyp   . Diverticulosis   . Family history of heart disease   . Fibromyalgia   . Gastric polyps   . GERD (gastroesophageal reflux disease)   . History of nuclear stress test 04/2010   nonischemic   . HLD (hyperlipidemia)   . HTN (hypertension)   . Hypothyroidism   . Meniere's disease   . Obesity   . Pinched vertebral nerve   . Raynaud's syndrome   . Right hip pain 08/18/2012  . Sleep apnea     Social History  Substance Use Topics  . Smoking status: Never Smoker  . Smokeless tobacco: Never Used  . Alcohol use No    Family History  Problem Relation Age of Onset  . Pancreatic cancer Mother   . Diabetes Mother   . Tuberculosis Mother   . Prostate cancer Father   . Colon polyps Father   . Diabetes Father   . Hyperlipidemia Father   . Hypertension Father        also kidney disease  . Heart disease Father        CABG, twice  . Diabetes Maternal Grandmother   . Colon cancer Maternal Grandfather   . Clotting disorder Paternal Grandmother        blood disorder  . Heart disease Paternal Grandfather   . Stroke Brother   . Diabetes Brother   . Breast cancer Maternal Aunt        x 4  . Colon cancer Maternal Uncle    . Von Willebrand disease Daughter   . Von Willebrand disease Daughter   . Hypertension Son   . Crohn's disease Grandchild   . Clotting disorder Grandchild        x3    Allergies  Allergen Reactions  . Statins Other (See Comments)    Makes her hurt bad Makes her hurt bad Makes her hurt bad  . Sulfa Antibiotics Rash and Hives  . Sulfamethoxazole-Trimethoprim Other (See Comments)  . Tramadol Nausea And Vomiting, Other (See Comments) and Nausea Only    Dizziness Medicine has her dizzy and nauseate Dizziness   . Erythromycin Diarrhea  . Other Other (See Comments) and Hives    Allergen - EKG electrodes vytorin vytorin Allergen - EKG electrodes vytorin  . Bactrim   . Vytorin [Ezetimibe-Simvastatin]   . Adhesive [Tape] Rash     Current Outpatient Prescriptions:  .  alendronate (FOSAMAX) 70 MG tablet, Take 70 mg by mouth every 7 (seven) days. Take with a full glass of water on an empty stomach., Disp: , Rfl:  .  AMBULATORY NON FORMULARY MEDICATION, Oxygen @ 2LMP At bedtime, Disp: , Rfl:  .  carboxymethylcellulose (  REFRESH TEARS) 0.5 % SOLN, Place 1 drop into both eyes every 3 (three) hours., Disp: , Rfl:  .  cetirizine (ZYRTEC) 10 MG tablet, Take 10 mg by mouth as needed for allergies., Disp: , Rfl:  .  chlordiazePOXIDE (LIBRIUM) 10 MG capsule, Take 10 mg by mouth as needed., Disp: , Rfl:  .  Cholecalciferol (VITAMIN D3) 2000 UNITS TABS, Take 2 tablets by mouth daily., Disp: , Rfl:  .  Coenzyme Q10 200 MG capsule, Take 200 mg by mouth daily., Disp: , Rfl:  .  cyanocobalamin (,VITAMIN B-12,) 1000 MCG/ML injection, Inject 1,000 mcg into the muscle every 30 (thirty) days., Disp: , Rfl:  .  erythromycin ophthalmic ointment, Place 1 application into both eyes every morning., Disp: , Rfl:  .  fluocinonide cream (LIDEX) 0.99 %, Apply 1 application topically 2 (two) times daily. 2 weeks of using cream; 2 weeks of not using cream, Disp: , Rfl:  .  furosemide (LASIX) 40 MG tablet, 1-2  tablets by mouth once daily, Disp: , Rfl:  .  gabapentin (NEURONTIN) 300 MG capsule, Take 600 mg by mouth 3 (three) times daily. One tablet in the morning, one tablet in the afternoon, and 2 tablets in the evening., Disp: , Rfl:  .  KRILL OIL PO, Take 1 capsule by mouth daily., Disp: , Rfl:  .  levothyroxine (SYNTHROID, LEVOTHROID) 50 MCG tablet, Take 50 mcg by mouth daily., Disp: , Rfl:  .  losartan-hydrochlorothiazide (HYZAAR) 100-25 MG per tablet, Take by mouth daily. 1/2 tablet daily, Disp: , Rfl:  .  Milnacipran (SAVELLA) 50 MG TABS, Take 50 mg by mouth 2 (two) times daily., Disp: , Rfl:  .  pantoprazole (PROTONIX) 40 MG tablet, TAKE 1 TABLET BY MOUTH DAILY BEFORE BREAKFAST AND TAKE 1 TABLET DAILY BEFORE DINNER, Disp: 60 tablet, Rfl: 0 .  potassium chloride SA (K-DUR,KLOR-CON) 20 MEQ tablet, Take 2 tablets by mouth every morning and 2 tablets by mouth every evening, Disp: , Rfl:   Vitals:   11/13/16 1412 11/13/16 1416  BP: (!) 144/81 (!) 141/79  Pulse: 91 91  Resp: 14   Temp: (!) 96.9 F (36.1 C)   SpO2: 96%   Weight: 160 lb (72.6 kg)   Height: 5\' 1"  (1.549 m)     Body mass index is 30.23 kg/m.         Review of Systems Denies chest pain, dyspnea on exertion, PND, orthopnea, hemoptysis, claudication    Objective:   Physical Exam BP (!) 141/79 (BP Location: Left Arm, Patient Position: Sitting, Cuff Size: Normal)   Pulse 91   Temp (!) 96.9 F (36.1 C)   Resp 14   Ht 5\' 1"  (1.549 m)   Wt 160 lb (72.6 kg)   SpO2 96%   BMI 30.23 kg/m   Gen. well-developed well-nourished female no apparent distress alert and oriented 3 Lungs no rhonchi or wheezing Left leg with hyperpigmentation lower third with some thickening of the skin but no active ulceration. 2+ dorsalis pedis pulse palpable. Bulging varicosities in the posterior calf and medial calf below the knee.  Today I visualized the left small saphenous vein with the sono site and it does appear occluded and the great  saphenous vein is slightly enlarged but there is no reflux.     Assessment:     Residual painful varicosities left leg following laser ablation left small saphenous vein-continuing to cause symptoms which are affecting patient's daily living    Plan:  Patient needs multiple stab phlebectomy-10-20 of residual painful varicosities left calf which we will schedule the near future Have also recommended elevation foot of bed 2-3 inches and apply short leg elastic compression stockings first thing in the morning

## 2016-11-13 NOTE — Progress Notes (Signed)
Vitals:   11/13/16 1412  BP: (!) 144/81  Pulse: 91  Resp: 14  Temp: (!) 96.9 F (36.1 C)  SpO2: 96%  Weight: 160 lb (72.6 kg)  Height: 5\' 1"  (1.549 m)

## 2016-11-30 DIAGNOSIS — L97511 Non-pressure chronic ulcer of other part of right foot limited to breakdown of skin: Secondary | ICD-10-CM | POA: Diagnosis not present

## 2016-11-30 DIAGNOSIS — M79671 Pain in right foot: Secondary | ICD-10-CM | POA: Diagnosis not present

## 2016-12-10 ENCOUNTER — Encounter: Payer: Self-pay | Admitting: Vascular Surgery

## 2016-12-17 ENCOUNTER — Other Ambulatory Visit: Payer: Self-pay | Admitting: Internal Medicine

## 2016-12-18 ENCOUNTER — Encounter: Payer: Self-pay | Admitting: Vascular Surgery

## 2016-12-18 ENCOUNTER — Ambulatory Visit (INDEPENDENT_AMBULATORY_CARE_PROVIDER_SITE_OTHER): Payer: Medicare Other | Admitting: Vascular Surgery

## 2016-12-18 VITALS — BP 158/89 | HR 99 | Temp 97.3°F | Resp 16 | Ht 61.0 in | Wt 170.0 lb

## 2016-12-18 DIAGNOSIS — I83892 Varicose veins of left lower extremities with other complications: Secondary | ICD-10-CM

## 2016-12-18 NOTE — Progress Notes (Signed)
    Stab Phlebectomy Procedure  Audrey [name] DOB:03-09-1941  12/18/2016  Consent signed: Yes  Surgeon:J.D. Kellie Simmering, MD  Procedure: stab phlebectomy: left leg  BP (!) 158/89   Pulse 99   Temp (!) 97.3 F (36.3 C)   Resp 16   Ht 5\' 1"  (1.549 m)   Wt 170 lb (77.1 kg)   SpO2 100%   BMI 32.12 kg/m   Start time: 2:30PM   End time: 3:00PM   Tumescent Anesthesia: 150 cc 0.9% NaCl with 50 cc Lidocaine HCL with 1% Epi and 15 cc 8.4% NaHCO3  Local Anesthesia: 1 cc Lidocaine HCL and NaHCO3 (ratio 2:1)  Stab Phlebectomy: 10-20 Sites: Calf  Patient tolerated procedure well: Yes   Description of Procedure:  After marking the course of the secondary varicosities, the patient was placed on the operating table in the supine position, and the left leg was prepped and draped in sterile fashion.    The patient was then put into Trendelenburg position.  Local anesthetic was administered at the previously marked varicosities, and tumescent anesthesia was administered around the vessels.  Ten to 20 stab wounds were made using the tip of an 11 blade. And using the vein hook, the phlebectomies were performed using a hemostat to avulse the varicosities.  Adequate hemostasis was achieved, and steri strips were applied to the stab wound.      ABD pads and thigh high compression stockings were applied as well ace wraps where needed. Blood loss was less than 15 cc.  The patient ambulated out of the operating room having tolerated the procedure well.

## 2016-12-18 NOTE — Progress Notes (Signed)
Subjective:     Patient ID: Audrey [name], female   DOB: [birth year], 76 y.o.   MRN: [id number]  HPI This 76 year old female had multiple stab phlebectomy of painful varicosities in the left distal thigh and posterior calf performed under local tumescent anesthesia-10-20. She tolerated the procedure well. Review of Systems     Objective:   Physical Exam BP (!) 158/89   Pulse 99   Temp (!) 97.3 F (36.3 C)   Resp 16   Ht 5\' 1"  (1.549 m)   Wt 170 lb (77.1 kg)   SpO2 100%   BMI 32.12 kg/m        Assessment:     Well-tolerated multiple stab phlebectomy painful varicosities left leg performed under local tumescent anesthesia-10-20    Plan:     Patient return to see me on a when necessary basis

## 2016-12-26 DIAGNOSIS — Z6832 Body mass index (BMI) 32.0-32.9, adult: Secondary | ICD-10-CM | POA: Diagnosis not present

## 2016-12-26 DIAGNOSIS — I83812 Varicose veins of left lower extremities with pain: Secondary | ICD-10-CM | POA: Diagnosis not present

## 2016-12-26 DIAGNOSIS — M159 Polyosteoarthritis, unspecified: Secondary | ICD-10-CM | POA: Diagnosis not present

## 2016-12-26 DIAGNOSIS — E538 Deficiency of other specified B group vitamins: Secondary | ICD-10-CM | POA: Diagnosis not present

## 2016-12-26 DIAGNOSIS — M797 Fibromyalgia: Secondary | ICD-10-CM | POA: Diagnosis not present

## 2016-12-26 DIAGNOSIS — E6609 Other obesity due to excess calories: Secondary | ICD-10-CM | POA: Diagnosis not present

## 2016-12-28 DIAGNOSIS — M79674 Pain in right toe(s): Secondary | ICD-10-CM | POA: Diagnosis not present

## 2016-12-28 DIAGNOSIS — L84 Corns and callosities: Secondary | ICD-10-CM | POA: Diagnosis not present

## 2016-12-28 DIAGNOSIS — M898X7 Other specified disorders of bone, ankle and foot: Secondary | ICD-10-CM | POA: Diagnosis not present

## 2017-01-11 DIAGNOSIS — J069 Acute upper respiratory infection, unspecified: Secondary | ICD-10-CM | POA: Diagnosis not present

## 2017-01-11 DIAGNOSIS — J209 Acute bronchitis, unspecified: Secondary | ICD-10-CM | POA: Diagnosis not present

## 2017-01-11 DIAGNOSIS — Z6831 Body mass index (BMI) 31.0-31.9, adult: Secondary | ICD-10-CM | POA: Diagnosis not present

## 2017-01-11 DIAGNOSIS — E6609 Other obesity due to excess calories: Secondary | ICD-10-CM | POA: Diagnosis not present

## 2017-01-22 DIAGNOSIS — M79674 Pain in right toe(s): Secondary | ICD-10-CM | POA: Diagnosis not present

## 2017-01-22 DIAGNOSIS — M898X7 Other specified disorders of bone, ankle and foot: Secondary | ICD-10-CM | POA: Diagnosis not present

## 2017-01-28 DIAGNOSIS — E6609 Other obesity due to excess calories: Secondary | ICD-10-CM | POA: Diagnosis not present

## 2017-01-28 DIAGNOSIS — N760 Acute vaginitis: Secondary | ICD-10-CM | POA: Diagnosis not present

## 2017-01-28 DIAGNOSIS — Z23 Encounter for immunization: Secondary | ICD-10-CM | POA: Diagnosis not present

## 2017-01-28 DIAGNOSIS — E538 Deficiency of other specified B group vitamins: Secondary | ICD-10-CM | POA: Diagnosis not present

## 2017-01-28 DIAGNOSIS — Z6831 Body mass index (BMI) 31.0-31.9, adult: Secondary | ICD-10-CM | POA: Diagnosis not present

## 2017-02-15 ENCOUNTER — Other Ambulatory Visit: Payer: Self-pay | Admitting: Internal Medicine

## 2017-02-19 DIAGNOSIS — M79674 Pain in right toe(s): Secondary | ICD-10-CM | POA: Diagnosis not present

## 2017-02-19 DIAGNOSIS — M898X7 Other specified disorders of bone, ankle and foot: Secondary | ICD-10-CM | POA: Diagnosis not present

## 2017-03-22 DIAGNOSIS — I7300 Raynaud's syndrome without gangrene: Secondary | ICD-10-CM | POA: Diagnosis not present

## 2017-03-22 DIAGNOSIS — M898X7 Other specified disorders of bone, ankle and foot: Secondary | ICD-10-CM | POA: Diagnosis not present

## 2017-03-22 DIAGNOSIS — M79674 Pain in right toe(s): Secondary | ICD-10-CM | POA: Diagnosis not present

## 2017-04-12 ENCOUNTER — Other Ambulatory Visit: Payer: Self-pay | Admitting: Internal Medicine

## 2017-04-19 DIAGNOSIS — I7300 Raynaud's syndrome without gangrene: Secondary | ICD-10-CM | POA: Diagnosis not present

## 2017-04-19 DIAGNOSIS — M79674 Pain in right toe(s): Secondary | ICD-10-CM | POA: Diagnosis not present

## 2017-04-19 DIAGNOSIS — M898X7 Other specified disorders of bone, ankle and foot: Secondary | ICD-10-CM | POA: Diagnosis not present

## 2017-05-12 ENCOUNTER — Other Ambulatory Visit: Payer: Self-pay | Admitting: Internal Medicine

## 2017-05-17 DIAGNOSIS — M898X7 Other specified disorders of bone, ankle and foot: Secondary | ICD-10-CM | POA: Diagnosis not present

## 2017-05-17 DIAGNOSIS — I7300 Raynaud's syndrome without gangrene: Secondary | ICD-10-CM | POA: Diagnosis not present

## 2017-05-17 DIAGNOSIS — M79674 Pain in right toe(s): Secondary | ICD-10-CM | POA: Diagnosis not present

## 2017-05-24 DIAGNOSIS — D510 Vitamin B12 deficiency anemia due to intrinsic factor deficiency: Secondary | ICD-10-CM | POA: Diagnosis not present

## 2017-06-10 DIAGNOSIS — E6609 Other obesity due to excess calories: Secondary | ICD-10-CM | POA: Diagnosis not present

## 2017-06-10 DIAGNOSIS — Z6832 Body mass index (BMI) 32.0-32.9, adult: Secondary | ICD-10-CM | POA: Diagnosis not present

## 2017-06-10 DIAGNOSIS — M722 Plantar fascial fibromatosis: Secondary | ICD-10-CM | POA: Diagnosis not present

## 2017-06-10 DIAGNOSIS — M1991 Primary osteoarthritis, unspecified site: Secondary | ICD-10-CM | POA: Diagnosis not present

## 2017-06-10 DIAGNOSIS — H02409 Unspecified ptosis of unspecified eyelid: Secondary | ICD-10-CM | POA: Diagnosis not present

## 2017-06-10 DIAGNOSIS — R52 Pain, unspecified: Secondary | ICD-10-CM | POA: Diagnosis not present

## 2017-06-11 ENCOUNTER — Encounter: Payer: Self-pay | Admitting: Internal Medicine

## 2017-06-11 ENCOUNTER — Ambulatory Visit (INDEPENDENT_AMBULATORY_CARE_PROVIDER_SITE_OTHER): Payer: Medicare Other | Admitting: Internal Medicine

## 2017-06-11 VITALS — BP 122/64 | HR 84 | Ht 61.0 in | Wt 170.0 lb

## 2017-06-11 DIAGNOSIS — R1319 Other dysphagia: Secondary | ICD-10-CM

## 2017-06-11 DIAGNOSIS — Z86010 Personal history of colonic polyps: Secondary | ICD-10-CM | POA: Diagnosis not present

## 2017-06-11 DIAGNOSIS — Z832 Family history of diseases of the blood and blood-forming organs and certain disorders involving the immune mechanism: Secondary | ICD-10-CM

## 2017-06-11 DIAGNOSIS — R1310 Dysphagia, unspecified: Secondary | ICD-10-CM | POA: Diagnosis not present

## 2017-06-11 DIAGNOSIS — K219 Gastro-esophageal reflux disease without esophagitis: Secondary | ICD-10-CM

## 2017-06-11 NOTE — Progress Notes (Signed)
Audrey [name] 78 y.o. 05-Oct-1940 [id number]  Assessment & Plan:   Encounter Diagnoses  Name Primary?  Marland Kitchen Hx of adenomatous polyp of colon Yes  . Gastroesophageal reflux disease without esophagitis   . Esophageal dysphagia   . Family history of von Willebrand disease     Colonoscopy for polyp surveillance - at hospital - she uses nocturnal o2 for sleep apnea  The risks and benefits as well as alternatives of endoscopic procedure(s) have been discussed and reviewed. All questions answered. The patient agrees to proceed.   Add Gaviscon prn and wedge pillow for GERD Dysphagia is stable, may be she is having some intermittent esophageal spasm.  Previous extensive workup was negative so we will not do anything different at this time.   As best I know she does not have vWF or excessive post op bleeding  I appreciate the opportunity to care for you. IO:EVOJJKK, John, MD    Subjective:   Chief Complaint: History of colon polyps GERD  HPI The patient is here alone, she was last seen in 2017 at which point we were going to have her do a colonoscopy after her back surgery but her sister developed melanoma which became metastatic and she was involved in caring for her and she has not been back since.  She had successful back surgery.  She had an adenomatous polyp in 2011 and is due for surveillance colonoscopy.  She also has GERD and while it is controlled most of the time she is having a couple of episodes each week at night especially with nocturnal reflux and she gets up and goes into her chair.  She uses extra pillows but does not think she can raise the head of the bed on blocks.  Her husband has sleeping difficulty so that would not work well for him.  She says that she tries her best to not eat late at night etc. but every once in a while she does and sometimes that causes a nocturnal reflux but sometimes it is not related to that.  She is on twice daily pantoprazole which helps.  6  or 7 times a month she will have solid or liquid dysphagia which is stable.  She had an extensive workup for dysphagia in the past including EGD with dilation and a manometry all of which were unremarkable including a barium swallow. Allergies  Allergen Reactions  . Statins Other (See Comments)    Makes her hurt bad Makes her hurt bad Makes her hurt bad  . Sulfa Antibiotics Rash and Hives  . Sulfamethoxazole-Trimethoprim Other (See Comments)  . Tramadol Nausea And Vomiting, Other (See Comments) and Nausea Only    Dizziness Medicine has her dizzy and nauseate Dizziness   . Erythromycin Diarrhea  . Other Other (See Comments) and Hives    Allergen - EKG electrodes vytorin vytorin Allergen - EKG electrodes vytorin  . Bactrim   . Vytorin [Ezetimibe-Simvastatin]   . Adhesive [Tape] Rash   Current Meds  Medication Sig  . alendronate (FOSAMAX) 70 MG tablet Take 70 mg by mouth every 7 (seven) days. Take with a full glass of water on an empty stomach.  . AMBULATORY NON FORMULARY MEDICATION Oxygen @ 2LMP At bedtime  . Calcium Carbonate-Vit D-Min (CALCIUM 1200 PO) Take 1,200 mg by mouth daily.  . carboxymethylcellulose (REFRESH TEARS) 0.5 % SOLN Place 1 drop into both eyes every 3 (three) hours.  . cetirizine (ZYRTEC) 10 MG tablet Take 10 mg by mouth as needed  for allergies.  . chlordiazePOXIDE (LIBRIUM) 10 MG capsule Take 10 mg by mouth as needed.  . Cholecalciferol (VITAMIN D3) 2000 UNITS TABS Take 2 tablets by mouth daily.  . Coenzyme Q10 200 MG capsule Take 200 mg by mouth daily.  . cyanocobalamin (,VITAMIN B-12,) 1000 MCG/ML injection Inject 1,000 mcg into the muscle every 30 (thirty) days.  Marland Kitchen erythromycin ophthalmic ointment Place 1 application into both eyes every morning.  . fluocinonide cream (LIDEX) 1.06 % Apply 1 application topically 2 (two) times daily. 2 weeks of using cream; 2 weeks of not using cream  . furosemide (LASIX) 40 MG tablet 1-2 tablets by mouth once daily  .  gabapentin (NEURONTIN) 300 MG capsule Take 600 mg by mouth 3 (three) times daily. One tablet in the morning, one tablet in the afternoon, and 2 tablets in the evening.  Marland Kitchen KRILL OIL PO Take 1 capsule by mouth daily.  Marland Kitchen levothyroxine (SYNTHROID, LEVOTHROID) 50 MCG tablet Take 50 mcg by mouth daily.  Marland Kitchen losartan-hydrochlorothiazide (HYZAAR) 100-25 MG per tablet Take by mouth daily. 1/2 tablet daily  . meloxicam (MOBIC) 15 MG tablet Take 15 mg by mouth daily as needed for pain.  . Milnacipran (SAVELLA) 50 MG TABS Take 50 mg by mouth 2 (two) times daily.  . pantoprazole (PROTONIX) 40 MG tablet TAKE 1 TABLET BY MOUTH DAILY BEFORE BREAKFAST AND TAKE 1 TABLET DAILY BEFORE DINNER  . potassium chloride SA (K-DUR,KLOR-CON) 20 MEQ tablet Take 2 tablets by mouth every morning and 2 tablets by mouth every evening   Past Medical History:  Diagnosis Date  . Allergic rhinitis   . Anxiety and depression   . Arthritis   . Basal cell carcinoma 2002  . Colon polyp   . Diverticulosis   . Family history of heart disease   . Fibromyalgia   . Gastric polyps   . GERD (gastroesophageal reflux disease)   . History of nuclear stress test 04/2010   nonischemic   . HLD (hyperlipidemia)   . HTN (hypertension)   . Hypothyroidism   . Meniere's disease   . Obesity   . Pinched vertebral nerve   . Raynaud's syndrome   . Right hip pain 08/18/2012  . Sleep apnea    Past Surgical History:  Procedure Laterality Date  . ABDOMINAL HYSTERECTOMY  1982  . BASAL CELL CARCINOMA EXCISION  03-31-01   face  . BLADDER REPAIR  2005   ovaries, tubes removed; pelvic prolapse corrected; rectal repair  . BLADDER SURGERY  01-2009   with mesh placement  . CATARACT EXTRACTION     bilateral  . COLONOSCOPY W/ BIOPSIES AND POLYPECTOMY  12/20/2009   diverticulosis, adenomatous polyps  . ENDOVENOUS ABLATION SAPHENOUS VEIN W/ LASER Left 08/07/2016   endovenous laser ablation left small saphenous vein by Tinnie Gens MD  . ESOPHAGEAL  MANOMETRY N/A 05/25/2013   Procedure: ESOPHAGEAL MANOMETRY (EM);  Surgeon: Gatha Mayer, MD;  Location: [facility];  Service: Endoscopy;  Laterality: N/A;  . ESOPHAGOGASTRODUODENOSCOPY (EGD) WITH ESOPHAGEAL DILATION  06/09/2012   Procedure: ESOPHAGOGASTRODUODENOSCOPY (EGD) WITH ESOPHAGEAL DILATION;  Surgeon: Gatha Mayer, MD;  Location: [facility];  Service: Endoscopy;  Laterality: N/A;  . FOOT SURGERY  1999   bilateral  . INTERSTIM IMPLANT PLACEMENT  01/2012   [facility]  . laser surgery bladder  05-25-10   on mesh  . LIPOMA EXCISION     right arm  . NASOLACRIMAL DUCT PROBING  09-12-10   right eye  . NASOLACRIMAL DUCT PROBING  09-26-10   left eye  . removal of bladder mesh  03-01-11   [facility]  . TONSILLECTOMY  1959  . TRANSTHORACIC ECHOCARDIOGRAM  12/2006   EF=>55%; LA mild-mod dilated; RA mildly dilated; mild mitral annular calcif, mild MR; mod TR & elevated RVSP; mild pulm vavle regurg  . UPPER GASTROINTESTINAL ENDOSCOPY  12/20/2009   GERD   Social History   Social History Narrative   Married   1 son and 2 daughters, 10 grandchildren   Remains active   family history includes Breast cancer in her maternal aunt; Clotting disorder in her grandchild and paternal grandmother; Colon cancer in her maternal grandfather and maternal uncle; Colon polyps in her father; Crohn's disease in her grandchild; Diabetes in her brother, father, maternal grandmother, and mother; Heart disease in her father and paternal grandfather; Hyperlipidemia in her father; Hypertension in her father and son; Pancreatic cancer in her mother; Prostate cancer in her father; Stroke in her brother; Tuberculosis in her mother; Von Willebrand disease in her daughter and daughter.   Review of Systems Grieving sisters loss   Objective:   Physical Exam BP 122/64   Pulse 84   Ht 5\' 1"  (1.549 m)   Wt 170 lb (77.1 kg)   BMI 32.12 kg/m  NAD Eyes anicteric Lungs cta Cor s1s2 no rmg

## 2017-06-11 NOTE — Patient Instructions (Signed)
Please buy a wedge pillow to use.  [employer], [employer], [employer], [employer] are all good places to check.  You may use over the counter Gaviscon as needed to help with your GERD.   You have been scheduled for a colonoscopy. Please follow written instructions given to you at your visit today.  Please pick up your prep supplies at the pharmacy. If you use inhalers (even only as needed), please bring them with you on the day of your procedure.  I appreciate the opportunity to care for you. Silvano Rusk, MD, [facility]

## 2017-06-14 DIAGNOSIS — I7300 Raynaud's syndrome without gangrene: Secondary | ICD-10-CM | POA: Diagnosis not present

## 2017-06-14 DIAGNOSIS — M79674 Pain in right toe(s): Secondary | ICD-10-CM | POA: Diagnosis not present

## 2017-06-14 DIAGNOSIS — M898X7 Other specified disorders of bone, ankle and foot: Secondary | ICD-10-CM | POA: Diagnosis not present

## 2017-06-17 DIAGNOSIS — L570 Actinic keratosis: Secondary | ICD-10-CM | POA: Diagnosis not present

## 2017-06-17 DIAGNOSIS — L821 Other seborrheic keratosis: Secondary | ICD-10-CM | POA: Diagnosis not present

## 2017-06-17 DIAGNOSIS — D485 Neoplasm of uncertain behavior of skin: Secondary | ICD-10-CM | POA: Diagnosis not present

## 2017-06-26 DIAGNOSIS — E7800 Pure hypercholesterolemia, unspecified: Secondary | ICD-10-CM | POA: Diagnosis not present

## 2017-06-26 DIAGNOSIS — E039 Hypothyroidism, unspecified: Secondary | ICD-10-CM | POA: Diagnosis not present

## 2017-06-26 DIAGNOSIS — D3501 Benign neoplasm of right adrenal gland: Secondary | ICD-10-CM | POA: Diagnosis not present

## 2017-07-02 DIAGNOSIS — Z1231 Encounter for screening mammogram for malignant neoplasm of breast: Secondary | ICD-10-CM | POA: Diagnosis not present

## 2017-07-05 DIAGNOSIS — M2669 Other specified disorders of temporomandibular joint: Secondary | ICD-10-CM | POA: Diagnosis not present

## 2017-07-05 DIAGNOSIS — H6121 Impacted cerumen, right ear: Secondary | ICD-10-CM | POA: Diagnosis not present

## 2017-07-05 DIAGNOSIS — J3489 Other specified disorders of nose and nasal sinuses: Secondary | ICD-10-CM | POA: Diagnosis not present

## 2017-07-08 DIAGNOSIS — N6489 Other specified disorders of breast: Secondary | ICD-10-CM | POA: Diagnosis not present

## 2017-07-12 DIAGNOSIS — M898X7 Other specified disorders of bone, ankle and foot: Secondary | ICD-10-CM | POA: Diagnosis not present

## 2017-07-12 DIAGNOSIS — I7300 Raynaud's syndrome without gangrene: Secondary | ICD-10-CM | POA: Diagnosis not present

## 2017-07-12 DIAGNOSIS — M79674 Pain in right toe(s): Secondary | ICD-10-CM | POA: Diagnosis not present

## 2017-07-23 DIAGNOSIS — T83120D Displacement of urinary electronic stimulator device, subsequent encounter: Secondary | ICD-10-CM | POA: Diagnosis not present

## 2017-07-23 DIAGNOSIS — N3281 Overactive bladder: Secondary | ICD-10-CM | POA: Diagnosis not present

## 2017-08-02 DIAGNOSIS — F321 Major depressive disorder, single episode, moderate: Secondary | ICD-10-CM | POA: Diagnosis not present

## 2017-08-02 DIAGNOSIS — D510 Vitamin B12 deficiency anemia due to intrinsic factor deficiency: Secondary | ICD-10-CM | POA: Diagnosis not present

## 2017-08-02 DIAGNOSIS — E6609 Other obesity due to excess calories: Secondary | ICD-10-CM | POA: Diagnosis not present

## 2017-08-02 DIAGNOSIS — Z6832 Body mass index (BMI) 32.0-32.9, adult: Secondary | ICD-10-CM | POA: Diagnosis not present

## 2017-08-09 DIAGNOSIS — I7300 Raynaud's syndrome without gangrene: Secondary | ICD-10-CM | POA: Diagnosis not present

## 2017-08-09 DIAGNOSIS — M79674 Pain in right toe(s): Secondary | ICD-10-CM | POA: Diagnosis not present

## 2017-08-09 DIAGNOSIS — M2040 Other hammer toe(s) (acquired), unspecified foot: Secondary | ICD-10-CM | POA: Diagnosis not present

## 2017-08-09 DIAGNOSIS — M898X7 Other specified disorders of bone, ankle and foot: Secondary | ICD-10-CM | POA: Diagnosis not present

## 2017-08-14 DIAGNOSIS — H01021 Squamous blepharitis right upper eyelid: Secondary | ICD-10-CM | POA: Diagnosis not present

## 2017-08-14 DIAGNOSIS — H40013 Open angle with borderline findings, low risk, bilateral: Secondary | ICD-10-CM | POA: Diagnosis not present

## 2017-08-14 DIAGNOSIS — H04123 Dry eye syndrome of bilateral lacrimal glands: Secondary | ICD-10-CM | POA: Diagnosis not present

## 2017-08-14 DIAGNOSIS — H02831 Dermatochalasis of right upper eyelid: Secondary | ICD-10-CM | POA: Diagnosis not present

## 2017-08-14 DIAGNOSIS — H01022 Squamous blepharitis right lower eyelid: Secondary | ICD-10-CM | POA: Diagnosis not present

## 2017-08-14 DIAGNOSIS — H02834 Dermatochalasis of left upper eyelid: Secondary | ICD-10-CM | POA: Diagnosis not present

## 2017-08-14 DIAGNOSIS — H01025 Squamous blepharitis left lower eyelid: Secondary | ICD-10-CM | POA: Diagnosis not present

## 2017-08-14 DIAGNOSIS — H04221 Epiphora due to insufficient drainage, right lacrimal gland: Secondary | ICD-10-CM | POA: Diagnosis not present

## 2017-08-14 DIAGNOSIS — H01024 Squamous blepharitis left upper eyelid: Secondary | ICD-10-CM | POA: Diagnosis not present

## 2017-08-14 DIAGNOSIS — Z961 Presence of intraocular lens: Secondary | ICD-10-CM | POA: Diagnosis not present

## 2017-08-15 ENCOUNTER — Encounter (HOSPITAL_COMMUNITY): Payer: Self-pay

## 2017-08-15 ENCOUNTER — Other Ambulatory Visit: Payer: Self-pay

## 2017-08-15 ENCOUNTER — Other Ambulatory Visit: Payer: Self-pay | Admitting: Family Medicine

## 2017-08-23 ENCOUNTER — Other Ambulatory Visit: Payer: Self-pay | Admitting: *Deleted

## 2017-08-23 DIAGNOSIS — D511 Vitamin B12 deficiency anemia due to selective vitamin B12 malabsorption with proteinuria: Secondary | ICD-10-CM | POA: Diagnosis not present

## 2017-08-23 DIAGNOSIS — E559 Vitamin D deficiency, unspecified: Secondary | ICD-10-CM | POA: Diagnosis not present

## 2017-08-23 DIAGNOSIS — Z6832 Body mass index (BMI) 32.0-32.9, adult: Secondary | ICD-10-CM | POA: Diagnosis not present

## 2017-08-23 DIAGNOSIS — D519 Vitamin B12 deficiency anemia, unspecified: Secondary | ICD-10-CM | POA: Diagnosis not present

## 2017-08-23 DIAGNOSIS — E782 Mixed hyperlipidemia: Secondary | ICD-10-CM | POA: Diagnosis not present

## 2017-08-23 DIAGNOSIS — M797 Fibromyalgia: Secondary | ICD-10-CM | POA: Diagnosis not present

## 2017-08-23 DIAGNOSIS — E6609 Other obesity due to excess calories: Secondary | ICD-10-CM | POA: Diagnosis not present

## 2017-08-23 DIAGNOSIS — R7309 Other abnormal glucose: Secondary | ICD-10-CM | POA: Diagnosis not present

## 2017-08-23 DIAGNOSIS — E538 Deficiency of other specified B group vitamins: Secondary | ICD-10-CM | POA: Diagnosis not present

## 2017-08-23 DIAGNOSIS — E039 Hypothyroidism, unspecified: Secondary | ICD-10-CM | POA: Diagnosis not present

## 2017-08-23 DIAGNOSIS — M1991 Primary osteoarthritis, unspecified site: Secondary | ICD-10-CM | POA: Diagnosis not present

## 2017-08-23 DIAGNOSIS — Z0001 Encounter for general adult medical examination with abnormal findings: Secondary | ICD-10-CM | POA: Diagnosis not present

## 2017-08-23 DIAGNOSIS — Z1389 Encounter for screening for other disorder: Secondary | ICD-10-CM | POA: Diagnosis not present

## 2017-08-23 MED ORDER — PANTOPRAZOLE SODIUM 40 MG PO TBEC: DELAYED_RELEASE_TABLET | ORAL | 5 refills | Status: DC

## 2017-08-23 NOTE — Telephone Encounter (Signed)
Pantoprazole refilled as requested. 

## 2017-08-26 ENCOUNTER — Encounter (HOSPITAL_COMMUNITY): Payer: Self-pay

## 2017-08-26 ENCOUNTER — Ambulatory Visit (HOSPITAL_COMMUNITY)
Admission: RE | Admit: 2017-08-26 | Discharge: 2017-08-26 | Disposition: A | Discharge status: Home / Self Care | Payer: Medicare Other | Source: Ambulatory Visit | Attending: Internal Medicine | Admitting: Internal Medicine

## 2017-08-26 ENCOUNTER — Other Ambulatory Visit: Payer: Self-pay

## 2017-08-26 ENCOUNTER — Ambulatory Visit (HOSPITAL_COMMUNITY): Payer: Medicare Other | Admitting: Registered Nurse

## 2017-08-26 ENCOUNTER — Encounter (HOSPITAL_COMMUNITY)
Admission: RE | Disposition: A | Discharge status: Home / Self Care | Payer: Self-pay | Source: Ambulatory Visit | Attending: Internal Medicine

## 2017-08-26 DIAGNOSIS — Z9841 Cataract extraction status, right eye: Secondary | ICD-10-CM | POA: Insufficient documentation

## 2017-08-26 DIAGNOSIS — Z6832 Body mass index (BMI) 32.0-32.9, adult: Secondary | ICD-10-CM | POA: Insufficient documentation

## 2017-08-26 DIAGNOSIS — Z823 Family history of stroke: Secondary | ICD-10-CM | POA: Insufficient documentation

## 2017-08-26 DIAGNOSIS — I10 Essential (primary) hypertension: Secondary | ICD-10-CM | POA: Insufficient documentation

## 2017-08-26 DIAGNOSIS — E669 Obesity, unspecified: Secondary | ICD-10-CM | POA: Insufficient documentation

## 2017-08-26 DIAGNOSIS — Z91048 Other nonmedicinal substance allergy status: Secondary | ICD-10-CM | POA: Insufficient documentation

## 2017-08-26 DIAGNOSIS — Z800 Family history of malignant neoplasm of digestive organs: Secondary | ICD-10-CM | POA: Insufficient documentation

## 2017-08-26 DIAGNOSIS — K219 Gastro-esophageal reflux disease without esophagitis: Secondary | ICD-10-CM | POA: Diagnosis not present

## 2017-08-26 DIAGNOSIS — Z8371 Family history of colonic polyps: Secondary | ICD-10-CM | POA: Insufficient documentation

## 2017-08-26 DIAGNOSIS — Z8379 Family history of other diseases of the digestive system: Secondary | ICD-10-CM | POA: Insufficient documentation

## 2017-08-26 DIAGNOSIS — G4730 Sleep apnea, unspecified: Secondary | ICD-10-CM | POA: Insufficient documentation

## 2017-08-26 DIAGNOSIS — H8109 Meniere's disease, unspecified ear: Secondary | ICD-10-CM | POA: Diagnosis not present

## 2017-08-26 DIAGNOSIS — Z882 Allergy status to sulfonamides status: Secondary | ICD-10-CM | POA: Insufficient documentation

## 2017-08-26 DIAGNOSIS — F418 Other specified anxiety disorders: Secondary | ICD-10-CM | POA: Diagnosis not present

## 2017-08-26 DIAGNOSIS — Z79899 Other long term (current) drug therapy: Secondary | ICD-10-CM | POA: Diagnosis not present

## 2017-08-26 DIAGNOSIS — R0600 Dyspnea, unspecified: Secondary | ICD-10-CM | POA: Diagnosis not present

## 2017-08-26 DIAGNOSIS — Z90710 Acquired absence of both cervix and uterus: Secondary | ICD-10-CM | POA: Insufficient documentation

## 2017-08-26 DIAGNOSIS — R7611 Nonspecific reaction to tuberculin skin test without active tuberculosis: Secondary | ICD-10-CM | POA: Insufficient documentation

## 2017-08-26 DIAGNOSIS — E039 Hypothyroidism, unspecified: Secondary | ICD-10-CM | POA: Insufficient documentation

## 2017-08-26 DIAGNOSIS — K5730 Diverticulosis of large intestine without perforation or abscess without bleeding: Secondary | ICD-10-CM | POA: Insufficient documentation

## 2017-08-26 DIAGNOSIS — I658 Occlusion and stenosis of other precerebral arteries: Secondary | ICD-10-CM | POA: Diagnosis not present

## 2017-08-26 DIAGNOSIS — Z8042 Family history of malignant neoplasm of prostate: Secondary | ICD-10-CM | POA: Insufficient documentation

## 2017-08-26 DIAGNOSIS — Z1211 Encounter for screening for malignant neoplasm of colon: Secondary | ICD-10-CM | POA: Insufficient documentation

## 2017-08-26 DIAGNOSIS — E785 Hyperlipidemia, unspecified: Secondary | ICD-10-CM | POA: Diagnosis not present

## 2017-08-26 DIAGNOSIS — Z9842 Cataract extraction status, left eye: Secondary | ICD-10-CM | POA: Insufficient documentation

## 2017-08-26 DIAGNOSIS — Z85828 Personal history of other malignant neoplasm of skin: Secondary | ICD-10-CM | POA: Diagnosis not present

## 2017-08-26 DIAGNOSIS — Z86010 Personal history of colonic polyps: Secondary | ICD-10-CM | POA: Diagnosis not present

## 2017-08-26 DIAGNOSIS — M1990 Unspecified osteoarthritis, unspecified site: Secondary | ICD-10-CM | POA: Diagnosis not present

## 2017-08-26 DIAGNOSIS — Z881 Allergy status to other antibiotic agents status: Secondary | ICD-10-CM | POA: Insufficient documentation

## 2017-08-26 DIAGNOSIS — Z832 Family history of diseases of the blood and blood-forming organs and certain disorders involving the immune mechanism: Secondary | ICD-10-CM | POA: Insufficient documentation

## 2017-08-26 DIAGNOSIS — Z8249 Family history of ischemic heart disease and other diseases of the circulatory system: Secondary | ICD-10-CM | POA: Diagnosis not present

## 2017-08-26 DIAGNOSIS — M797 Fibromyalgia: Secondary | ICD-10-CM | POA: Insufficient documentation

## 2017-08-26 DIAGNOSIS — Z833 Family history of diabetes mellitus: Secondary | ICD-10-CM | POA: Insufficient documentation

## 2017-08-26 DIAGNOSIS — Z803 Family history of malignant neoplasm of breast: Secondary | ICD-10-CM | POA: Insufficient documentation

## 2017-08-26 DIAGNOSIS — Z860101 Personal history of adenomatous and serrated colon polyps: Secondary | ICD-10-CM

## 2017-08-26 DIAGNOSIS — Z888 Allergy status to other drugs, medicaments and biological substances status: Secondary | ICD-10-CM | POA: Insufficient documentation

## 2017-08-26 HISTORY — DX: Nausea with vomiting, unspecified: R11.2

## 2017-08-26 HISTORY — PX: COLONOSCOPY WITH PROPOFOL: SHX5780

## 2017-08-26 HISTORY — DX: Depression, unspecified: F32.A

## 2017-08-26 HISTORY — DX: Other specified postprocedural states: Z98.890

## 2017-08-26 HISTORY — DX: Dyspnea, unspecified: R06.00

## 2017-08-26 HISTORY — DX: Respiratory tuberculosis unspecified: A15.9

## 2017-08-26 HISTORY — DX: Major depressive disorder, single episode, unspecified: F32.9

## 2017-08-26 SURGERY — COLONOSCOPY WITH PROPOFOL
Anesthesia: Monitor Anesthesia Care

## 2017-08-26 MED ORDER — PROPOFOL 10 MG/ML IV BOLUS
INTRAVENOUS | Status: AC
  Filled 2017-08-26: qty 40

## 2017-08-26 MED ORDER — PROPOFOL 10 MG/ML IV BOLUS
INTRAVENOUS | Status: AC
  Filled 2017-08-26: qty 20

## 2017-08-26 MED ORDER — PROPOFOL 500 MG/50ML IV EMUL
INTRAVENOUS | Status: DC | PRN
  Administered 2017-08-26: 130 ug/kg/min via INTRAVENOUS

## 2017-08-26 MED ORDER — SODIUM CHLORIDE 0.9 % IV SOLN: INTRAVENOUS | Status: DC

## 2017-08-26 MED ORDER — LACTATED RINGERS IV SOLN
INTRAVENOUS | Status: DC | PRN
  Administered 2017-08-26: 09:00:00 via INTRAVENOUS

## 2017-08-26 MED ORDER — PROPOFOL 10 MG/ML IV BOLUS
INTRAVENOUS | Status: DC | PRN
  Administered 2017-08-26 (×2): 20 mg via INTRAVENOUS

## 2017-08-26 SURGICAL SUPPLY — 21 items

## 2017-08-26 NOTE — Anesthesia Preprocedure Evaluation (Signed)
Anesthesia Evaluation  Patient identified by MRN, date of birth, ID band Patient awake    Reviewed: Allergy & Precautions, NPO status , Patient's Chart, lab work & pertinent test results  History of Anesthesia Complications (+) PONV  Airway Mallampati: II  TM Distance: >3 FB Neck ROM: Full    Dental no notable dental hx.    Pulmonary neg pulmonary ROS, sleep apnea ,    Pulmonary exam normal breath sounds clear to auscultation       Cardiovascular hypertension, Pt. on medications negative cardio ROS Normal cardiovascular exam Rhythm:Regular Rate:Normal     Neuro/Psych Anxiety Depression  Neuromuscular disease negative neurological ROS  negative psych ROS   GI/Hepatic negative GI ROS, Neg liver ROS, GERD  ,  Endo/Other  negative endocrine ROS  Renal/GU negative Renal ROS  negative genitourinary   Musculoskeletal negative musculoskeletal ROS (+) Arthritis , Fibromyalgia -  Abdominal   Peds negative pediatric ROS (+)  Hematology negative hematology ROS (+)   Anesthesia Other Findings   Reproductive/Obstetrics negative OB ROS                             Anesthesia Physical Anesthesia Plan  ASA: II  Anesthesia Plan: MAC   Post-op Pain Management:    Induction: Intravenous  PONV Risk Score and Plan: 3 and Treatment may vary due to age or medical condition  Airway Management Planned: Simple Face Mask  Additional Equipment:   Intra-op Plan:   Post-operative Plan:   Informed Consent: I have reviewed the patients History and Physical, chart, labs and discussed the procedure including the risks, benefits and alternatives for the proposed anesthesia with the patient or authorized representative who has indicated his/her understanding and acceptance.   Dental advisory given  Plan Discussed with: CRNA  Anesthesia Plan Comments:         Anesthesia Quick Evaluation

## 2017-08-26 NOTE — Discharge Instructions (Signed)
° °  No polyps today!  No more routine colonoscopy testing.  You do have diverticulosis - thickened muscle rings and pouches in the colon wall. Please read the handout about this condition.  I appreciate the opportunity to care for you. Gatha Mayer, MD, FACG  YOU HAD AN ENDOSCOPIC PROCEDURE TODAY: Refer to the procedure report and other information in the discharge instructions given to you for any specific questions about what was found during the examination. If this information does not answer your questions, please call [facility] office at [phone] to clarify.   YOU SHOULD EXPECT: Some feelings of bloating in the abdomen. Passage of more gas than usual. Walking can help get rid of the air that was put into your GI tract during the procedure and reduce the bloating. If you had a lower endoscopy (such as a colonoscopy or flexible sigmoidoscopy) you may notice spotting of blood in your stool or on the toilet paper. Some abdominal soreness may be present for a day or two, also.  DIET: Your first meal following the procedure should be a light meal and then it is ok to progress to your normal diet. A half-sandwich or bowl of soup is an example of a good first meal. Heavy or fried foods are harder to digest and may make you feel nauseous or bloated. Drink plenty of fluids but you should avoid alcoholic beverages for 24 hours. If you had a esophageal dilation, please see attached instructions for diet.    ACTIVITY: Your care partner should take you home directly after the procedure. You should plan to take it easy, moving slowly for the rest of the day. You can resume normal activity the day after the procedure however YOU SHOULD NOT DRIVE, use power tools, machinery or perform tasks that involve climbing or major physical exertion for 24 hours (because of the sedation medicines used during the test).   SYMPTOMS TO REPORT IMMEDIATELY: A gastroenterologist can be reached at any hour. Please  call [phone]  for any of the following symptoms:  Following lower endoscopy (colonoscopy, flexible sigmoidoscopy) Excessive amounts of blood in the stool  Significant tenderness, worsening of abdominal pains  Swelling of the abdomen that is new, acute  Fever of 100 or higher  Following upper endoscopy (EGD, EUS, ERCP, esophageal dilation) Vomiting of blood or coffee ground material  New, significant abdominal pain  New, significant chest pain or pain under the shoulder blades  Painful or persistently difficult swallowing  New shortness of breath  Black, tarry-looking or red, bloody stools  FOLLOW UP:  If any biopsies were taken you will be contacted by phone or by letter within the next 1-3 weeks. Call [phone]  if you have not heard about the biopsies in 3 weeks.  Please also call with any specific questions about appointments or follow up tests.

## 2017-08-26 NOTE — Anesthesia Postprocedure Evaluation (Signed)
Anesthesia Post Note  Patient: Audrey [name]  Procedure(s) Performed: COLONOSCOPY WITH PROPOFOL (N/A )     Patient location during evaluation: PACU Anesthesia Type: MAC Level of consciousness: awake and alert Pain management: pain level controlled Vital Signs Assessment: post-procedure vital signs reviewed and stable Respiratory status: spontaneous breathing, nonlabored ventilation and respiratory function stable Cardiovascular status: stable and blood pressure returned to baseline Postop Assessment: no apparent nausea or vomiting Anesthetic complications: no    Last Vitals:  Vitals:   08/26/17 1000 08/26/17 1010  BP: (!) 119/54 (!) 178/79  Pulse: 82 83  Resp: (!) 22 (!) 22  Temp:    SpO2: 99% 99%    Last Pain:  Vitals:   08/26/17 1010  TempSrc:   PainSc: 0-No pain                 Lynda Rainwater

## 2017-08-26 NOTE — Op Note (Signed)
[facility] Patient Name: Audrey [name] Procedure Date: 08/26/2017 MRN: [id number] Attending MD: Gatha Mayer , MD Date of Birth: 23-Aug-1940 CSN: [id number] Age: 77 Admit Type: Outpatient Procedure:                Colonoscopy Indications:              Surveillance: Personal history of adenomatous                            polyps on last colonoscopy > 5 years ago Providers:                Gatha Mayer, MD, Presley Raddle, RN, Laurena Spies, Technician, Courtney Heys. [place], CRNA Referring MD:              Medicines:                Propofol per Anesthesia, Monitored Anesthesia Care Complications:            No immediate complications. Estimated Blood Loss:     Estimated blood loss: none. Procedure:                Pre-Anesthesia Assessment:                           - Prior to the procedure, a History and Physical                            was performed, and patient medications and                            allergies were reviewed. The patient's tolerance of                            previous anesthesia was also reviewed. The risks                            and benefits of the procedure and the sedation                            options and risks were discussed with the patient.                            All questions were answered, and informed consent                            was obtained. Prior Anticoagulants: The patient has                            taken no previous anticoagulant or antiplatelet                            agents. ASA Grade Assessment: II - A patient with  mild systemic disease. After reviewing the risks                            and benefits, the patient was deemed in                            satisfactory condition to undergo the procedure.                           After obtaining informed consent, the colonoscope                            was passed under direct vision.  Throughout the                            procedure, the patient's blood pressure, pulse, and                            oxygen saturations were monitored continuously. The                            EC-[id number] ([id number]) scope was introduced through                            the anus and advanced to the the cecum, identified                            by appendiceal orifice and ileocecal valve. The                            colonoscopy was performed without difficulty. The                            patient tolerated the procedure well. The quality                            of the bowel preparation was adequate. The bowel                            preparation used was Miralax. The ileocecal valve,                            appendiceal orifice, and rectum were photographed. Scope In: 9:26:45 AM Scope Out: 9:48:32 AM Scope Withdrawal Time: 0 hours 15 minutes 56 seconds  Total Procedure Duration: 0 hours 21 minutes 47 seconds  Findings:      The perianal and digital rectal examinations were normal.      Multiple diverticula were found in the sigmoid colon.      The exam was otherwise without abnormality on direct and retroflexion       views. Impression:               - Diverticulosis in the sigmoid colon.                           -  The examination was otherwise normal on direct                            and retroflexion views.                           - No specimens collected. Moderate Sedation:      N/A- Per Anesthesia Care Recommendation:           - Patient has a contact number available for                            emergencies. The signs and symptoms of potential                            delayed complications were discussed with the                            patient. Return to normal activities tomorrow.                            Written discharge instructions were provided to the                            patient.                           - Resume previous diet.                            - Continue present medications.                           - No repeat colonoscopy due to age. Procedure Code(s):        --- Professional ---                           E7035, Colorectal cancer screening; colonoscopy on                            individual at high risk Diagnosis Code(s):        --- Professional ---                           Z86.010, Personal history of colonic polyps                           K57.30, Diverticulosis of large intestine without                            perforation or abscess without bleeding CPT copyright 2017 [employer]. All rights reserved. The codes documented in this report are preliminary and upon coder review may  be revised to meet current compliance requirements. Gatha Mayer, MD 08/26/2017 9:56:05 AM This report has been signed electronically. Number of Addenda: 0

## 2017-08-26 NOTE — H&P (Signed)
[facility] History and Physical   Primary Care Physician:  Sharilyn Sites, MD   Reason for Procedure:   f/u colon polyps  Plan:    Colonoscopy The risks and benefits as well as alternatives of endoscopic procedure(s) have been discussed and reviewed. All questions answered. The patient agrees to proceed.   HPI: Audrey [name] is a 77 y.o. female w/ hx adenomatous colon polyps last in 2011 here for surveillance colonoscopy/   Past Medical History:  Diagnosis Date  . Allergic rhinitis   . Anxiety and depression   . Arthritis   . Basal cell carcinoma 2002  . Colon polyp   . Depression   . Diverticulosis   . Dyspnea    with excertion  . Family history of heart disease   . Fibromyalgia   . Gastric polyps   . GERD (gastroesophageal reflux disease)   . History of nuclear stress test 04/2010   nonischemic   . HLD (hyperlipidemia)   . HTN (hypertension)   . Hypothyroidism   . Meniere's disease   . Obesity   . Pinched vertebral nerve   . PONV (postoperative nausea and vomiting)   . Raynaud's syndrome   . Right hip pain 08/18/2012  . Sleep apnea   . Tuberculosis    postive skin test no symptoms  was around mom that had it    Past Surgical History:  Procedure Laterality Date  . ABDOMINAL HYSTERECTOMY  1982  . BASAL CELL CARCINOMA EXCISION  03-31-01   face  . BLADDER REPAIR  2005   ovaries, tubes removed; pelvic prolapse corrected; rectal repair  . BLADDER SURGERY  01-2009   with mesh placement  . CATARACT EXTRACTION     bilateral  . COLONOSCOPY W/ BIOPSIES AND POLYPECTOMY  12/20/2009   diverticulosis, adenomatous polyps  . ENDOVENOUS ABLATION SAPHENOUS VEIN W/ LASER Left 08/07/2016   endovenous laser ablation left small saphenous vein by Tinnie Gens MD  . ESOPHAGEAL MANOMETRY N/A 05/25/2013   Procedure: ESOPHAGEAL MANOMETRY (EM);  Surgeon: Gatha Mayer, MD;  Location: [facility];  Service: Endoscopy;  Laterality: N/A;  . ESOPHAGOGASTRODUODENOSCOPY (EGD)  WITH ESOPHAGEAL DILATION  06/09/2012   Procedure: ESOPHAGOGASTRODUODENOSCOPY (EGD) WITH ESOPHAGEAL DILATION;  Surgeon: Gatha Mayer, MD;  Location: [facility];  Service: Endoscopy;  Laterality: N/A;  . FOOT SURGERY  1999   bilateral  . INTERSTIM IMPLANT PLACEMENT  01/2012   [facility]  . laser surgery bladder  05-25-10   on mesh  . LIPOMA EXCISION     right arm  . NASOLACRIMAL DUCT PROBING  09-12-10   right eye  . NASOLACRIMAL DUCT PROBING  09-26-10   left eye  . removal of bladder mesh  03-01-11   [facility]  . TONSILLECTOMY  1959  . TRANSTHORACIC ECHOCARDIOGRAM  12/2006   EF=>55%; LA mild-mod dilated; RA mildly dilated; mild mitral annular calcif, mild MR; mod TR & elevated RVSP; mild pulm vavle regurg  . UPPER GASTROINTESTINAL ENDOSCOPY  12/20/2009   GERD    Prior to Admission medications   Medication Sig Start Date End Date Taking? Authorizing Provider  alendronate (FOSAMAX) 70 MG tablet Take 70 mg by mouth every Sunday. Take with a full glass of water on an empty stomach.    Yes [provider]  Calcium Carbonate-Vit D-Min (CALCIUM 1200 PO) Take 1 tablet by mouth daily.    Yes [provider]  chlordiazePOXIDE (LIBRIUM) 10 MG capsule Take 10 mg by mouth 2 (two) times daily.  Yes [provider]  Cholecalciferol (VITAMIN D3) 2000 UNITS TABS Take 6,000 Units by mouth at bedtime.    Yes [provider]  Coenzyme Q10 200 MG capsule Take 200 mg by mouth daily.   Yes [provider]  cyanocobalamin (,VITAMIN B-12,) 1000 MCG/ML injection Inject 1,000 mcg into the muscle every 30 (thirty) days.   Yes [provider]  furosemide (LASIX) 40 MG tablet Take 40 mg by mouth daily.    Yes [provider]  gabapentin (NEURONTIN) 600 MG tablet Take 600 mg by mouth 3 (three) times daily.    Yes [provider]  KRILL OIL PO Take 1 capsule by mouth daily.   Yes [provider]  levothyroxine (SYNTHROID, LEVOTHROID) 50 MCG tablet  Take 50 mcg by mouth daily before breakfast.    Yes [provider]  losartan-hydrochlorothiazide (HYZAAR) 100-25 MG per tablet Take 0.5 tablets by mouth daily.  02/24/13  Yes [provider]  meloxicam (MOBIC) 15 MG tablet Take 15 mg by mouth daily as needed for pain.   Yes [provider]  Milnacipran (SAVELLA) 50 MG TABS Take 100 mg by mouth 2 (two) times daily.    Yes [provider]  nitrofurantoin, macrocrystal-monohydrate, (MACROBID) 100 MG capsule Take 100 mg by mouth at bedtime. 08/15/17  Yes [provider]  OXYGEN Inhale 2 L into the lungs at bedtime.    Yes [provider]  pantoprazole (PROTONIX) 40 MG tablet TAKE 1 TABLET BY MOUTH DAILY BEFORE BREAKFAST AND TAKE 1 TABLET DAILY BEFORE DINNER 08/23/17  Yes Gatha Mayer, MD  Polyethyl Glycol-Propyl Glycol (SYSTANE OP) Place 1 drop into both eyes 2 (two) times daily.   Yes [provider]  potassium chloride SA (K-DUR,KLOR-CON) 20 MEQ tablet Take 40 mEq by mouth 2 (two) times daily.    Yes [provider]  rosuvastatin (CRESTOR) 5 MG tablet Take 5 mg by mouth every Monday, Wednesday, and Friday.   Yes [provider]    Current Facility-Administered Medications  Medication Dose Route Frequency Provider Last Rate Last Dose  . 0.9 %  sodium chloride infusion   Intravenous Continuous Gatha Mayer, MD        Allergies as of 06/11/2017 - Review Complete 06/11/2017  Allergen Reaction Noted  . Statins Other (See Comments) 06/26/2011  . Sulfa antibiotics Rash and Hives   . Sulfamethoxazole-trimethoprim Other (See Comments)   . Tramadol Nausea And Vomiting, Other (See Comments), and Nausea Only 09/17/2012  . Erythromycin Diarrhea   . Other Other (See Comments) and Hives 09/24/2014  . Bactrim  06/26/2011  . Vytorin [ezetimibe-simvastatin]  03/23/2013  . Adhesive [tape] Rash 06/03/2012    Family History  Problem Relation Age of Onset  . Pancreatic cancer  Mother   . Diabetes Mother   . Tuberculosis Mother   . Prostate cancer Father   . Colon polyps Father   . Diabetes Father   . Hyperlipidemia Father   . Hypertension Father        also kidney disease  . Heart disease Father        CABG, twice  . Diabetes Maternal Grandmother   . Colon cancer Maternal Grandfather   . Clotting disorder Paternal Grandmother        blood disorder  . Heart disease Paternal Grandfather   . Stroke Brother   . Diabetes Brother   . Breast cancer Maternal Aunt        x 4  .  Colon cancer Maternal Uncle   . Von Willebrand disease Daughter   . Von Willebrand disease Daughter   . Hypertension Son   . Crohn's disease Grandchild   . Clotting disorder Grandchild        x3    Social History   Socioeconomic History  . Marital status: Married    Spouse name: Not on file  . Number of children: 3  . Years of education: Not on file  . Highest education level: Not on file  Occupational History  . Occupation: retired - [employer]  Social Needs  . Financial resource strain: Not on file  . Food insecurity:    Worry: Not on file    Inability: Not on file  . Transportation needs:    Medical: Not on file    Non-medical: Not on file  Tobacco Use  . Smoking status: Never Smoker  . Smokeless tobacco: Never Used  Substance and Sexual Activity  . Alcohol use: No  . Drug use: No  . Sexual activity: Not Currently  Lifestyle  . Physical activity:    Days per week: Not on file    Minutes per session: Not on file  . Stress: Not on file  Relationships  . Social connections:    Talks on phone: Not on file    Gets together: Not on file    Attends religious service: Not on file    Active member of club or organization: Not on file    Attends meetings of clubs or organizations: Not on file    Relationship status: Not on file  . Intimate partner violence:    Fear of current or ex partner: Not on file    Emotionally abused: Not on file    Physically  abused: Not on file    Forced sexual activity: Not on file  Other Topics Concern  . Not on file  Social History Narrative   Married   1 son and 2 daughters, 10 grandchildren   Remains active    Review of Systems:  All other review of systems negative except as mentioned in the HPI.  Physical Exam: Vital signs in last 24 hours: Temp:  [97.8 F (36.6 C)] 97.8 F (36.6 C) (04/08 0820) Pulse Rate:  [74] 74 (04/08 0820) Resp:  [16] 16 (04/08 0820) BP: (149)/(75) 149/75 (04/08 0820) SpO2:  [100 %] 100 % (04/08 0820) Weight:  [173 lb (78.5 kg)] 173 lb (78.5 kg) (04/08 0820)   General:   Alert,  Well-developed, well-nourished, pleasant and cooperative in NAD Lungs:  Clear throughout to auscultation.   Heart:  Regular rate and rhythm; no murmurs, clicks, rubs,  or gallops. Abdomen:  Soft, nontender and nondistended. Normal bowel sounds.   Neuro/Psych:  Alert and cooperative. Normal mood and affect. A and O x 3   @Carl  Simonne Maffucci, MD, [facility] [phone] (pager) 08/26/2017 8:37 AM@

## 2017-08-26 NOTE — Transfer of Care (Signed)
Immediate Anesthesia Transfer of Care Note  Patient: Audrey [name]  Procedure(s) Performed: COLONOSCOPY WITH PROPOFOL (N/A )  Patient Location: PACU and Endoscopy Unit  Anesthesia Type:MAC  Level of Consciousness: awake, alert , oriented and patient cooperative  Airway & Oxygen Therapy: Patient Spontanous Breathing and Patient connected to face mask oxygen  Post-op Assessment: Report given to RN, Post -op Vital signs reviewed and stable and Patient moving all extremities  Post vital signs: Reviewed and stable  Last Vitals:  Vitals Value Taken Time  BP 107/47 08/26/2017  9:51 AM  Temp    Pulse 83 08/26/2017  9:53 AM  Resp 20 08/26/2017  9:53 AM  SpO2 100 % 08/26/2017  9:53 AM  Vitals shown include unvalidated device data.  Last Pain:  Vitals:   08/26/17 0820  TempSrc: Oral  PainSc: 0-No pain         Complications: No apparent anesthesia complications

## 2017-08-28 ENCOUNTER — Encounter (HOSPITAL_COMMUNITY): Payer: Self-pay | Admitting: Internal Medicine

## 2017-09-10 DIAGNOSIS — N3941 Urge incontinence: Secondary | ICD-10-CM | POA: Diagnosis not present

## 2017-09-10 DIAGNOSIS — N393 Stress incontinence (female) (male): Secondary | ICD-10-CM | POA: Diagnosis not present

## 2017-09-10 DIAGNOSIS — T83718D Erosion of other implanted mesh and other prosthetic materials to surrounding organ or tissue, subsequent encounter: Secondary | ICD-10-CM | POA: Diagnosis not present

## 2017-09-10 DIAGNOSIS — R151 Fecal smearing: Secondary | ICD-10-CM | POA: Diagnosis not present

## 2017-09-10 DIAGNOSIS — N390 Urinary tract infection, site not specified: Secondary | ICD-10-CM | POA: Diagnosis not present

## 2017-09-10 DIAGNOSIS — Z87440 Personal history of urinary (tract) infections: Secondary | ICD-10-CM | POA: Diagnosis not present

## 2017-09-23 DIAGNOSIS — E6609 Other obesity due to excess calories: Secondary | ICD-10-CM | POA: Diagnosis not present

## 2017-09-23 DIAGNOSIS — Z6833 Body mass index (BMI) 33.0-33.9, adult: Secondary | ICD-10-CM | POA: Diagnosis not present

## 2017-09-23 DIAGNOSIS — J069 Acute upper respiratory infection, unspecified: Secondary | ICD-10-CM | POA: Diagnosis not present

## 2017-10-01 DIAGNOSIS — H04123 Dry eye syndrome of bilateral lacrimal glands: Secondary | ICD-10-CM | POA: Diagnosis not present

## 2017-10-01 DIAGNOSIS — H04552 Acquired stenosis of left nasolacrimal duct: Secondary | ICD-10-CM | POA: Diagnosis not present

## 2017-10-01 DIAGNOSIS — H04223 Epiphora due to insufficient drainage, bilateral lacrimal glands: Secondary | ICD-10-CM | POA: Diagnosis not present

## 2017-10-01 DIAGNOSIS — H04221 Epiphora due to insufficient drainage, right lacrimal gland: Secondary | ICD-10-CM | POA: Diagnosis not present

## 2017-10-01 DIAGNOSIS — H04551 Acquired stenosis of right nasolacrimal duct: Secondary | ICD-10-CM | POA: Diagnosis not present

## 2017-10-04 DIAGNOSIS — M2040 Other hammer toe(s) (acquired), unspecified foot: Secondary | ICD-10-CM | POA: Diagnosis not present

## 2017-10-04 DIAGNOSIS — M898X7 Other specified disorders of bone, ankle and foot: Secondary | ICD-10-CM | POA: Diagnosis not present

## 2017-10-04 DIAGNOSIS — M79674 Pain in right toe(s): Secondary | ICD-10-CM | POA: Diagnosis not present

## 2017-10-04 DIAGNOSIS — I7300 Raynaud's syndrome without gangrene: Secondary | ICD-10-CM | POA: Diagnosis not present

## 2017-10-08 DIAGNOSIS — N390 Urinary tract infection, site not specified: Secondary | ICD-10-CM | POA: Diagnosis not present

## 2017-10-08 DIAGNOSIS — N3946 Mixed incontinence: Secondary | ICD-10-CM | POA: Diagnosis not present

## 2017-10-08 DIAGNOSIS — T83718D Erosion of other implanted mesh and other prosthetic materials to surrounding organ or tissue, subsequent encounter: Secondary | ICD-10-CM | POA: Diagnosis not present

## 2017-10-08 DIAGNOSIS — N3941 Urge incontinence: Secondary | ICD-10-CM | POA: Diagnosis not present

## 2017-10-09 ENCOUNTER — Other Ambulatory Visit: Payer: Self-pay | Admitting: Podiatry

## 2017-10-22 DIAGNOSIS — D519 Vitamin B12 deficiency anemia, unspecified: Secondary | ICD-10-CM | POA: Diagnosis not present

## 2017-10-28 DIAGNOSIS — Z6832 Body mass index (BMI) 32.0-32.9, adult: Secondary | ICD-10-CM | POA: Diagnosis not present

## 2017-10-28 DIAGNOSIS — E6609 Other obesity due to excess calories: Secondary | ICD-10-CM | POA: Diagnosis not present

## 2017-10-28 DIAGNOSIS — N390 Urinary tract infection, site not specified: Secondary | ICD-10-CM | POA: Diagnosis not present

## 2017-10-28 DIAGNOSIS — N342 Other urethritis: Secondary | ICD-10-CM | POA: Diagnosis not present

## 2017-10-29 ENCOUNTER — Other Ambulatory Visit: Payer: Self-pay | Admitting: Ophthalmology

## 2017-10-29 DIAGNOSIS — H04123 Dry eye syndrome of bilateral lacrimal glands: Secondary | ICD-10-CM | POA: Diagnosis not present

## 2017-10-29 DIAGNOSIS — H04223 Epiphora due to insufficient drainage, bilateral lacrimal glands: Secondary | ICD-10-CM | POA: Diagnosis not present

## 2017-10-29 DIAGNOSIS — H04561 Stenosis of right lacrimal punctum: Secondary | ICD-10-CM | POA: Diagnosis not present

## 2017-10-29 DIAGNOSIS — H04411 Chronic dacryocystitis of right lacrimal passage: Secondary | ICD-10-CM | POA: Diagnosis not present

## 2017-10-29 DIAGNOSIS — H04221 Epiphora due to insufficient drainage, right lacrimal gland: Secondary | ICD-10-CM | POA: Diagnosis not present

## 2017-10-29 DIAGNOSIS — H04551 Acquired stenosis of right nasolacrimal duct: Secondary | ICD-10-CM | POA: Diagnosis not present

## 2017-10-29 DIAGNOSIS — H04552 Acquired stenosis of left nasolacrimal duct: Secondary | ICD-10-CM | POA: Diagnosis not present

## 2017-10-30 IMAGING — CT CT L SPINE W/ CM
2 of 7 series · 8 of 20 positions shown, 10 images · non-contrast
Comparison: CT myelogram 09/30/2012.

CLINICAL DATA: Low back pain. LEFT much greater than RIGHT leg
pain.
TECHNIQUE: Contiguous axial images were obtained through the Lumbar spine after
the intrathecal infusion of infusion. Coronal and sagittal
reconstructions were obtained of the axial image sets.

[Series 2: l spine soft · axial · 0.27mm/px · z∈[-228,-92]mm · 5 of 69 slices shown, 7 images]
[im 12/69  soft-tissue]
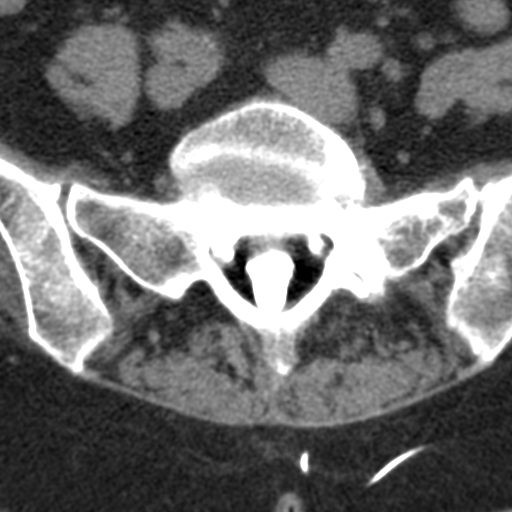
[im 12/69  bone]
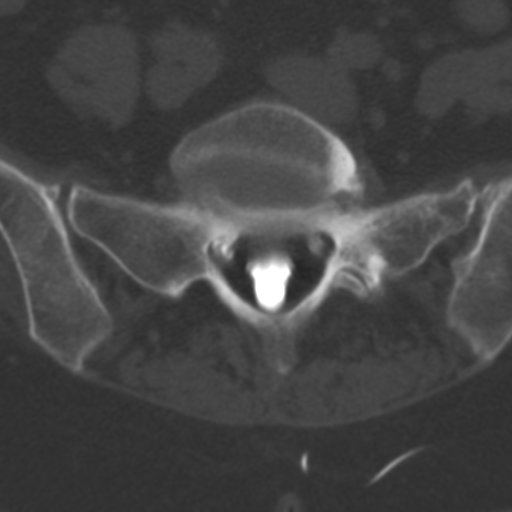
[im 23/69  bone]
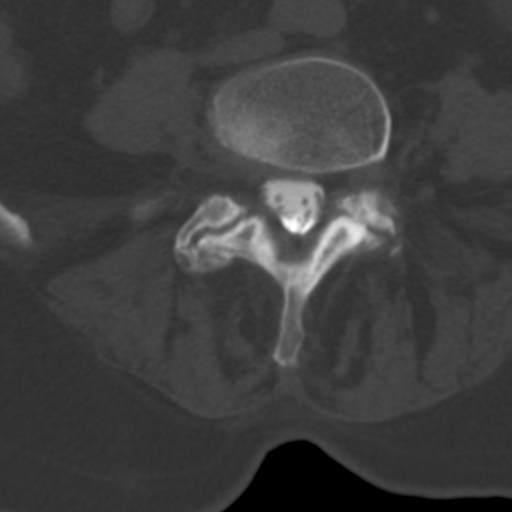
[im 35/69  bone]
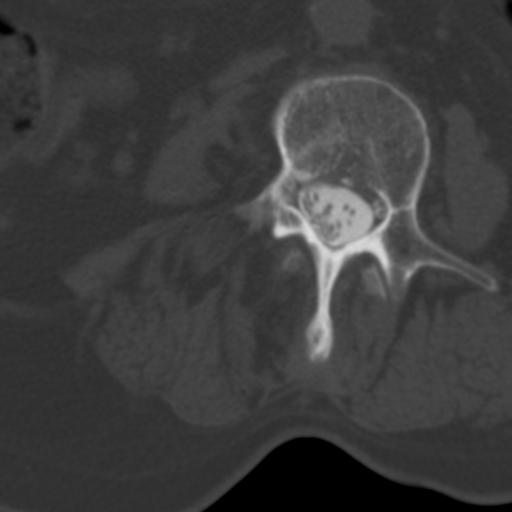
[im 46/69  bone]
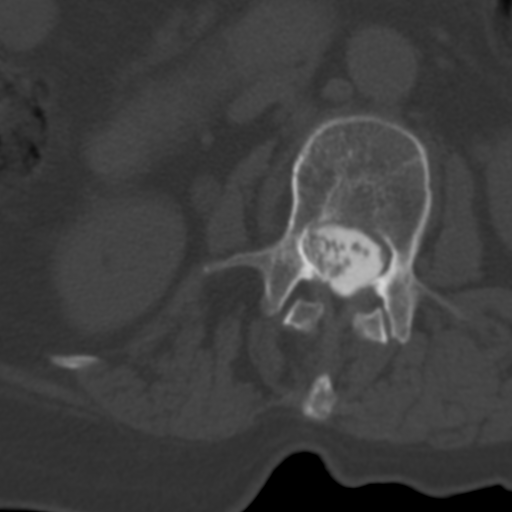
[im 57/69  soft-tissue]
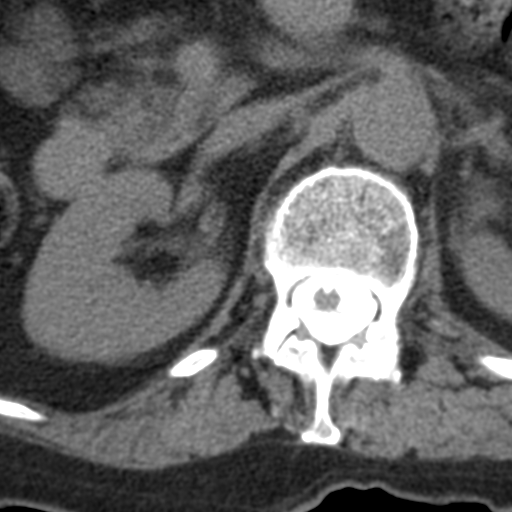
[im 57/69  bone]
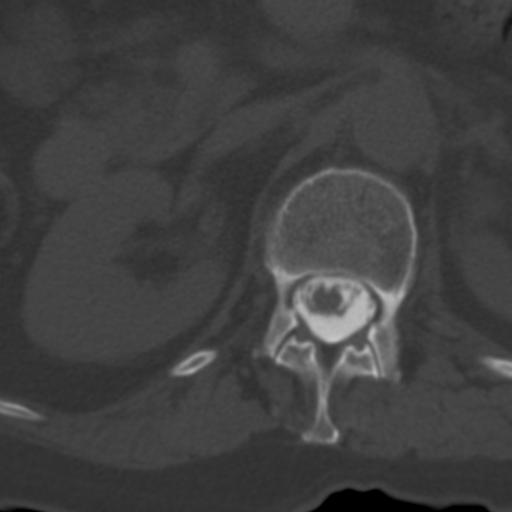

[Series 6: bone cor · coronal · 0.28mm/px · 3 of 35 slices shown]
[im 7/35  bone]
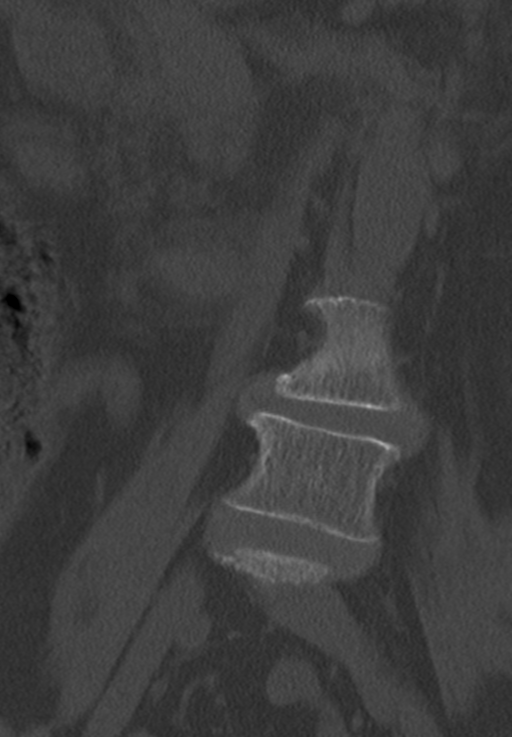
[im 14/35  bone]
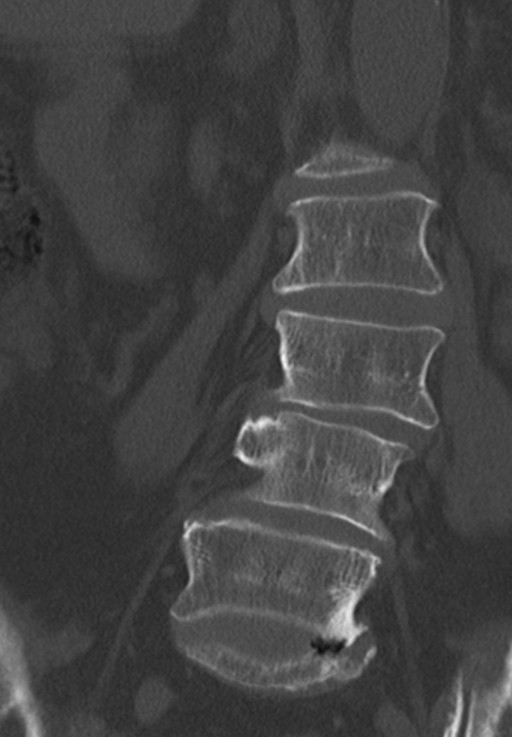
[im 21/35  bone]
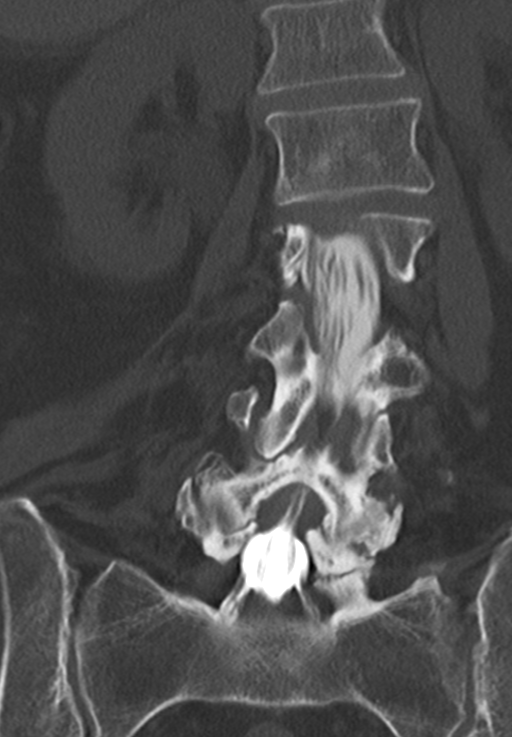

[8 of 20 positions shown; findings below may reference images not displayed]

EXAM:
LUMBAR MYELOGRAM

FLUOROSCOPY TIME:  31 seconds corresponding to a Dose Area Product
of 194.69 ?Gy*m2

PROCEDURE:
After thorough discussion of risks and benefits of the procedure
including bleeding, infection, injury to nerves, blood vessels,
adjacent structures as well as headache and CSF leak, written and
oral informed consent was obtained. Consent was obtained by Dr. Mulugeta
Christian Damian. Time out form was completed.

Patient was positioned prone on the fluoroscopy table. Local
anesthesia was provided with 1% lidocaine without epinephrine after
prepped and draped in the usual sterile fashion. Puncture was
performed at L3-L4 using a 3 1/2 inch 22-gauge spinal needle via
midline approach. Using a single pass through the dura, the needle
was placed within the thecal sac, with return of clear CSF. 15 mL of
Isovue-M 200 was injected into the thecal sac, with normal
opacification of the nerve roots and cauda equina consistent with
free flow within the subarachnoid space.

I personally performed the lumbar puncture and administered the
intrathecal contrast. I also personally supervised acquisition of
the myelogram images.
FINDINGS: LUMBAR MYELOGRAM FINDINGS:

Good opacification of the lumbar subarachnoid space. Bladder
stimulator precludes MRI examination.

Leftward translation L4 on L5, as well as asymmetric loss of
interspace height at L5-S1 contribute to subarticular zone narrowing
at these levels, effacing the LEFT L5 and LEFT S1 nerve roots. No
frank nerve root cut off is seen.

Related to scoliosis, there is asymmetric loss of interspace height
at L2-3 and L3-4 on the RIGHT.

Prominent ventral defect at L1-2, consistent with disc herniation.

No areas of significant spinal stenosis. 1 mm anterolisthesis at
L4-5 and L5-S1 with patient prone for myelography.

With patient standing, anterolisthesis at L4-5 increases to 2 mm in
neutral, 3 mm in flexion, and 1 mm in extension. No dynamic
instability at L5-S1.

With patient standing, degenerative scoliosis convex LEFT centered
at L2-L3 measures approximately 27 degrees.

CT LUMBAR MYELOGRAM FINDINGS:

Segmentation: Normal.

Alignment:  Normal.

Vertebrae: No worrisome osseous lesion.Baastrup's type changes
involving the spinous processes between L2-3 and L3-4.

Conus medullaris: Normal in size. Minimally low termination upper
L2.

Paraspinal tissues: No evidence for hydronephrosis or paravertebral
mass. Previous identified RIGHT adrenal adenoma, stable.

Disc levels:

L1-L2:  Central extrusion.  No stenosis or impingement.

L2-L3: Annular bulging. Asymmetric loss of interspace height on the
RIGHT. No impingement.

L3-L4: Shallow central protrusion. Asymmetric loss of interspace
height on the RIGHT. No impingement.

L4-L5: Trace anterolisthesis. Slight leftward translation L4 on L5.
Shallow central and leftward protrusion. Posterior element
hypertrophy. LEFT subarticular zone narrowing and foraminal zone
narrowing. LEFT L4 and LEFT L5 nerve root impingement are possible.

L5-S1: No significant subarticular zone narrowing despite shallow
central protrusion. Disc space narrowing much worse on the LEFT.
Endplate spurring and facet arthropathy narrow the LEFT neural
foramen. LEFT L5 nerve root impingement is likely.

Compared with priors, foraminal narrowing at L5-S1 on the LEFT is
worse.
IMPRESSION: LUMBAR MYELOGRAM IMPRESSION:

Degenerative scoliosisConvex LEFT of 27 degrees. Asymmetric loss of
interspace height at L2-3 and L3-4 not clearly compressive.
Compensatory loss of interspace height at L5-S1 on the LEFT. Minor
dynamic instability at L4-5. Leftward translation L4 on L5. Ventral
extradural defect L1-L2.

CT LUMBAR MYELOGRAM IMPRESSION:

Progressive foraminal narrowing at L5-S1 on the LEFT. LEFT L5 nerve
root impingement is likely.

Central and leftward protrusion L4-5 associated with leftward
translation. LEFT L4 and LEFT L5 nerve root impingement are
possible.

Central extrusion L1-2 without stenosis or impingement.

## 2017-11-06 NOTE — Patient Instructions (Signed)
Audrey [name]  11/06/2017     @PREFPERIOPPHARMACY @   Your procedure is scheduled on  11/13/2017 .  Report to [facility] at  630   A.M.  Call this number if you have problems the morning of surgery:  [phone]   Remember:  Do not eat or drink after midnight.  You may drink clear liquids until  12 midnight 11/12/2017 .  Clear liquids allowed are:                    Water, Juice (non-citric and without pulp), Carbonated beverages, Clear Tea, Black Coffee only, Plain Jell-O only, Gatorade and Plain Popsicles only    Take these medicines the morning of surgery with A SIP OF WATER  Librium, neurontin, levothyroxine, losartan, mobic, protonix.    Do not wear jewelry, make-up or nail polish.  Do not wear lotions, powders, or perfumes, or deodorant.  Do not shave 48 hours prior to surgery.  Men may shave face and neck.  Do not bring valuables to the hospital.  [facility] is not responsible for any belongings or valuables.  Contacts, dentures or bridgework may not be worn into surgery.  Leave your suitcase in the car.  After surgery it may be brought to your room.  For patients admitted to the hospital, discharge time will be determined by your treatment team.  Patients discharged the day of surgery will not be allowed to drive home.   Name and phone number of your driver:   family Special instructions:  None  Please read over the following fact sheets that you were given. Anesthesia Post-op Instructions and Care and Recovery After Surgery       Ostectomy of the Foot Ostectomy of the foot is a surgery to remove part of a bone in the foot. You may need this procedure if:  You have an abnormal bone growth called a bone spur (osteophyte) in your foot.  Bones in your foot are not lined up with each other (misalignment).  The procedure may be done if one of these conditions is causing pain or limiting movement. It is usually done when other treatments have not  helped. During an ostectomy, part of the bone is removed to shorten or reshape the bone. Tell a health care provider about:  Any allergies you have.  All medicines you are taking, including vitamins, herbs, eye drops, creams, and over-the-counter medicines.  Any problems you or family members have had with anesthetic medicines.  Any blood disorders you have.  Any surgeries you have had.  Any medical conditions you have.  Whether you are pregnant or may be pregnant. What are the risks? Generally, this is a safe procedure. However, problems may occur, including:  Infection.  Bleeding.  Pain.  Stiffness.  Numbness.  Allergic reactions to medicines.  Damage to nerves or blood vessels in the foot.  A blood clot that forms in the leg and travels to the lung.  Failure of the bone to heal.  The abnormal bone growth coming back again (recurrence).  What happens before the procedure? Staying hydrated Follow instructions from your health care provider about hydration, which may include:  Up to 2 hours before the procedure - you may continue to drink clear liquids, such as water, clear fruit juice, black coffee, and plain tea.  Eating and drinking restrictions Follow instructions from your health care provider about eating and drinking, which may include:  8 hours before the procedure - stop eating heavy meals or foods such as meat, fried foods, or fatty foods.  6 hours before the procedure - stop eating light meals or foods, such as toast or cereal.  6 hours before the procedure - stop drinking milk or drinks that contain milk.  2 hours before the procedure - stop drinking clear liquids.  Medicines  Ask your health care provider about: ? Changing or stopping your regular medicines. This is especially important if you are taking diabetes medicines or blood thinners. ? Taking medicines such as aspirin and ibuprofen. These medicines can thin your blood. Do not take these  medicines before your procedure if your health care provider instructs you not to.  You may be given antibiotic medicine to help prevent infection. General instructions  You may have foot X-rays done while you are standing on your foot. This allows the surgeon to clearly see the abnormal bone growth in your foot.  Ask your health care provider how your surgical site will be marked or identified.  You may be asked to shower with a germ-killing soap.  Plan to have a responsible adult care for you for at least 24 hours after you leave the hospital or clinic. This is important. What happens during the procedure?  To lower your risk of infection: ? Your health care team will wash or sanitize their hands. ? Your skin will be washed with soap. ? Hair may be removed from the surgical area.  You will be given one or more of the following: ? A medicine to help you relax (sedative). ? A medicine to numb the area (local anesthetic). ? A medicine to make you fall asleep (general anesthetic). ? A medicine that is injected into your spine to numb the area below and slightly above the injection site (spinal anesthetic). ? A medicine that is injected into an area of your body to numb everything below the injection site (regional anesthetic).  An incision will be made in your foot.  Tissues, nerves, and blood vessels near the bone will be moved out of the way.  The spur or the part of bone that needs to be removed will be cut away using a bone saw or a bone shaving instrument.  The remaining bone may be secured with screws, wires, or plates.  The skin incision will be closed with stitches (sutures) or another type of skin closure.  A bandage (dressing) will be placed over the incision.  A soft wrap may be placed around your foot. The procedure may vary among health care providers and hospitals. What happens after the procedure?  Your blood pressure, heart rate, breathing rate, and blood  oxygen level will be monitored until the medicines you were given have worn off.  Your foot may be placed in a protective shoe, a splint, or a cast.  You may be given crutches or a walker to help you walk without putting weight (bearing weight) on your foot.  Do not drive for 24 hours if you were given a sedative. Summary  Ostectomy of the foot is a surgery to remove part of a bone in the foot.  You may need this procedure if you have an abnormal bone growth called a bone spur (osteophyte) or if bones in your foot are not lined up with each other (misalignment). The procedure may be done if you have pain or limited movement and other treatments have not helped.  Follow instructions from your health  care provider about eating and drinking before the procedure.  After the procedure, your foot may be placed in a protective shoe, a splint, or a cast. You may be given crutches or a walker to help you walk without putting weight (bearing weight) on your foot. This information is not intended to replace advice given to you by your health care provider. Make sure you discuss any questions you have with your health care provider. Document Released: 06/26/2016 Document Revised: 06/26/2016 Document Reviewed: 06/26/2016 Elsevier Interactive Patient Education  2018 [place] After This sheet gives you information about how to care for yourself after your procedure. Your health care provider may also give you more specific instructions. If you have problems or questions, contact your health care provider. What can I expect after the procedure? After the procedure, it is common to have:  Pain.  Swelling.  Stiffness.  Follow these instructions at home: If you have a splint or supportive shoe:  Wear the splint or shoe as told by your health care provider. Remove it only as told by your health care provider.  Loosen the splint or shoe if your toes tingle, become  numb, or turn cold and blue.  Keep the splint or shoe clean.  If the splint or shoe is not waterproof: ? Do not let it get wet. ? Cover it with a watertight covering when you take a bath or a shower. If you have a cast:  Do not stick anything inside the cast to scratch your skin. Doing that increases your risk of infection.  Check the skin around the cast every day. Tell your health care provider about any concerns.  You may put lotion on dry skin around the edges of the cast. Do not put lotion on the skin underneath the cast.  Keep the cast clean.  If the cast is not waterproof: ? Do not let it get wet. ? Cover it with a watertight covering when you take a bath or a shower. Bathing  Do not take baths, swim, or use a hot tub until your health care provider approves. Ask your health care provider if you may take showers. You may only be allowed to take sponge baths for bathing.  If your cast or splint is not waterproof, cover it with a watertight covering when you take a bath or a shower.  Keep the bandage (dressing) dry until your health care provider says it can be removed. Incision care  Follow instructions from your health care provider about how to take care of your incision. Make sure you: ? Wash your hands with soap and water before you change your bandage (dressing). If soap and water are not available, use hand sanitizer. ? Change your dressing as told by your health care provider. ? Leave stitches (sutures), skin glue, or adhesive strips in place. These skin closures may need to stay in place for 2 weeks or longer. If adhesive strip edges start to loosen and curl up, you may trim the loose edges. Do not remove adhesive strips completely unless your health care provider tells you to do that.  Check your incision area every day for signs of infection. Check for: ? Redness, swelling, or pain. ? Fluid or blood. ? Warmth. ? Pus or a bad smell. Managing pain, stiffness, and  swelling  If directed, put ice on the affected area. ? If you have a removable splint or shoe, remove it as told by your  health care provider. ? Put ice in a plastic bag. ? Place a towel between your skin and the bag or between your cast and the bag. ? Leave the ice on for 20 minutes, 2-3 times a day.  Move your foot and toes often to avoid stiffness and to lessen swelling.  Raise (elevate) your foot area above the level of your heart while you are sitting or lying down. Driving  Do not drive or use heavy machinery while taking prescription pain medicine.  Ask your health care provider when it is safe to drive if you have a cast, splint, or supportive shoe on your foot. Activity  Return to your normal activities as told by your health care provider. Ask your health care provider what activities are safe for you.  Do exercises as told by your health care provider. Safety  Do not use your foot to support your body weight until your health care provider says that you can.  Use your crutches or walker as told by your health care provider. General instructions  Do not put pressure on any part of the cast or splint until it is fully hardened. This may take several hours.  Do not use any products that contain nicotine or tobacco, such as cigarettes and e-cigarettes. These can delay bone healing. If you need help quitting, ask your health care provider.  Take over-the-counter and prescription medicines only as told by your health care provider.  Keep all follow-up visits as told by your health care provider. This is important. Contact a health care provider if:  You have chills or a fever.  Your pain is not controlled by your pain medicine.  You have redness, swelling, or pain around your incision.  You have fluid or blood coming from your incision.  Your incision feels warm to the touch.  You have pus or a bad smell coming from your incision. Get help right away if:  You  have severe pain.  You have new pain, warmth, and swelling in your leg.  You have chest pain or difficulty breathing. Summary  After the procedure, it is common to have some pain, swelling, and stiffness.  Follow instructions from your health care provider about how to take care of your incision. Check the incision area every day for signs of infection.  Do not use your foot to support your body weight until your health care provider says that you can.  Move your foot and toes often to avoid stiffness and to lessen swelling. This information is not intended to replace advice given to you by your health care provider. Make sure you discuss any questions you have with your health care provider. Document Released: 06/26/2016 Document Revised: 06/26/2016 Document Reviewed: 06/26/2016 Elsevier Interactive Patient Education  2018 [place] Anesthesia is a term that refers to techniques, procedures, and medicines that help a person stay safe and comfortable during a medical procedure. Monitored anesthesia care, or sedation, is one type of anesthesia. Your anesthesia specialist may recommend sedation if you will be having a procedure that does not require you to be unconscious, such as:  Cataract surgery.  A dental procedure.  A biopsy.  A colonoscopy.  During the procedure, you may receive a medicine to help you relax (sedative). There are three levels of sedation:  Mild sedation. At this level, you may feel awake and relaxed. You will be able to follow directions.  Moderate sedation. At this level, you will be sleepy.  You may not remember the procedure.  Deep sedation. At this level, you will be asleep. You will not remember the procedure.  The more medicine you are given, the deeper your level of sedation will be. Depending on how you respond to the procedure, the anesthesia specialist may change your level of sedation or the type of anesthesia to fit  your needs. An anesthesia specialist will monitor you closely during the procedure. Let your health care provider know about:  Any allergies you have.  All medicines you are taking, including vitamins, herbs, eye drops, creams, and over-the-counter medicines.  Any use of steroids (by mouth or as a cream).  Any problems you or family members have had with sedatives and anesthetic medicines.  Any blood disorders you have.  Any surgeries you have had.  Any medical conditions you have, such as sleep apnea.  Whether you are pregnant or may be pregnant.  Any use of cigarettes, alcohol, or street drugs. What are the risks? Generally, this is a safe procedure. However, problems may occur, including:  Getting too much medicine (oversedation).  Nausea.  Allergic reaction to medicines.  Trouble breathing. If this happens, a breathing tube may be used to help with breathing. It will be removed when you are awake and breathing on your own.  Heart trouble.  Lung trouble.  Before the procedure Staying hydrated Follow instructions from your health care provider about hydration, which may include:  Up to 2 hours before the procedure - you may continue to drink clear liquids, such as water, clear fruit juice, black coffee, and plain tea.  Eating and drinking restrictions Follow instructions from your health care provider about eating and drinking, which may include:  8 hours before the procedure - stop eating heavy meals or foods such as meat, fried foods, or fatty foods.  6 hours before the procedure - stop eating light meals or foods, such as toast or cereal.  6 hours before the procedure - stop drinking milk or drinks that contain milk.  2 hours before the procedure - stop drinking clear liquids.  Medicines Ask your health care provider about:  Changing or stopping your regular medicines. This is especially important if you are taking diabetes medicines or blood  thinners.  Taking medicines such as aspirin and ibuprofen. These medicines can thin your blood. Do not take these medicines before your procedure if your health care provider instructs you not to.  Tests and exams  You will have a physical exam.  You may have blood tests done to show: ? How well your kidneys and liver are working. ? How well your blood can clot.  General instructions  Plan to have someone take you home from the hospital or clinic.  If you will be going home right after the procedure, plan to have someone with you for 24 hours.  What happens during the procedure?  Your blood pressure, heart rate, breathing, level of pain and overall condition will be monitored.  An IV tube will be inserted into one of your veins.  Your anesthesia specialist will give you medicines as needed to keep you comfortable during the procedure. This may mean changing the level of sedation.  The procedure will be performed. After the procedure  Your blood pressure, heart rate, breathing rate, and blood oxygen level will be monitored until the medicines you were given have worn off.  Do not drive for 24 hours if you received a sedative.  You may: ? Feel sleepy,  clumsy, or nauseous. ? Feel forgetful about what happened after the procedure. ? Have a sore throat if you had a breathing tube during the procedure. ? Vomit. This information is not intended to replace advice given to you by your health care provider. Make sure you discuss any questions you have with your health care provider. Document Released: 01/31/2005 Document Revised: 10/14/2015 Document Reviewed: 08/28/2015 Elsevier Interactive Patient Education  2018 [place], Care After These instructions provide you with information about caring for yourself after your procedure. Your health care provider may also give you more specific instructions. Your treatment has been planned according to current  medical practices, but problems sometimes occur. Call your health care provider if you have any problems or questions after your procedure. What can I expect after the procedure? After your procedure, it is common to:  Feel sleepy for several hours.  Feel clumsy and have poor balance for several hours.  Feel forgetful about what happened after the procedure.  Have poor judgment for several hours.  Feel nauseous or vomit.  Have a sore throat if you had a breathing tube during the procedure.  Follow these instructions at home: For at least 24 hours after the procedure:   Do not: ? Participate in activities in which you could fall or become injured. ? Drive. ? Use heavy machinery. ? Drink alcohol. ? Take sleeping pills or medicines that cause drowsiness. ? Make important decisions or sign legal documents. ? Take care of children on your own.  Rest. Eating and drinking  Follow the diet that is recommended by your health care provider.  If you vomit, drink water, juice, or soup when you can drink without vomiting.  Make sure you have little or no nausea before eating solid foods. General instructions  Have a responsible adult stay with you until you are awake and alert.  Take over-the-counter and prescription medicines only as told by your health care provider.  If you smoke, do not smoke without supervision.  Keep all follow-up visits as told by your health care provider. This is important. Contact a health care provider if:  You keep feeling nauseous or you keep vomiting.  You feel light-headed.  You develop a rash.  You have a fever. Get help right away if:  You have trouble breathing. This information is not intended to replace advice given to you by your health care provider. Make sure you discuss any questions you have with your health care provider. Document Released: 08/28/2015 Document Revised: 12/28/2015 Document Reviewed: 08/28/2015 Elsevier  Interactive Patient Education  [employer].

## 2017-11-08 ENCOUNTER — Encounter (HOSPITAL_COMMUNITY): Payer: Self-pay

## 2017-11-08 ENCOUNTER — Ambulatory Visit (HOSPITAL_COMMUNITY)
Admission: RE | Admit: 2017-11-08 | Discharge: 2017-11-08 | Disposition: A | Discharge status: Home / Self Care | Payer: Medicare Other | Source: Ambulatory Visit | Attending: Podiatry | Admitting: Podiatry

## 2017-11-08 ENCOUNTER — Encounter (HOSPITAL_COMMUNITY)
Admission: RE | Admit: 2017-11-08 | Discharge: 2017-11-08 | Disposition: A | Discharge status: Home / Self Care | Payer: Medicare Other | Source: Ambulatory Visit | Attending: Podiatry | Admitting: Podiatry

## 2017-11-08 ENCOUNTER — Other Ambulatory Visit: Payer: Self-pay

## 2017-11-08 DIAGNOSIS — R9431 Abnormal electrocardiogram [ECG] [EKG]: Secondary | ICD-10-CM | POA: Diagnosis not present

## 2017-11-08 DIAGNOSIS — M79674 Pain in right toe(s): Secondary | ICD-10-CM | POA: Diagnosis not present

## 2017-11-08 DIAGNOSIS — M2040 Other hammer toe(s) (acquired), unspecified foot: Secondary | ICD-10-CM | POA: Insufficient documentation

## 2017-11-08 DIAGNOSIS — Z01818 Encounter for other preprocedural examination: Secondary | ICD-10-CM | POA: Diagnosis not present

## 2017-11-08 DIAGNOSIS — Z01810 Encounter for preprocedural cardiovascular examination: Secondary | ICD-10-CM | POA: Diagnosis not present

## 2017-11-08 DIAGNOSIS — M2041 Other hammer toe(s) (acquired), right foot: Secondary | ICD-10-CM | POA: Diagnosis not present

## 2017-11-08 DIAGNOSIS — Z01812 Encounter for preprocedural laboratory examination: Secondary | ICD-10-CM | POA: Insufficient documentation

## 2017-11-08 DIAGNOSIS — M898X7 Other specified disorders of bone, ankle and foot: Secondary | ICD-10-CM | POA: Diagnosis not present

## 2017-11-08 DIAGNOSIS — I7300 Raynaud's syndrome without gangrene: Secondary | ICD-10-CM | POA: Diagnosis not present

## 2017-11-08 LAB — CBC WITH DIFFERENTIAL/PLATELET
BASOS ABS: 0 10*3/uL (ref 0.0–0.1)
BASOS PCT: 1 %
EOS PCT: 2 %
Eosinophils Absolute: 0.2 10*3/uL (ref 0.0–0.7)
HCT: 38.8 % (ref 36.0–46.0)
Hemoglobin: 12 g/dL (ref 12.0–15.0)
Lymphocytes Relative: 29 %
Lymphs Abs: 1.9 10*3/uL (ref 0.7–4.0)
MCH: 29.9 pg (ref 26.0–34.0)
MCHC: 30.9 g/dL (ref 30.0–36.0)
MCV: 96.8 fL (ref 78.0–100.0)
MONO ABS: 0.6 10*3/uL (ref 0.1–1.0)
Monocytes Relative: 10 %
NEUTROS ABS: 3.7 10*3/uL (ref 1.7–7.7)
Neutrophils Relative %: 58 %
PLATELETS: 236 10*3/uL (ref 150–400)
RBC: 4.01 MIL/uL (ref 3.87–5.11)
RDW: 14.9 % (ref 11.5–15.5)
WBC: 6.4 10*3/uL (ref 4.0–10.5)

## 2017-11-08 LAB — BASIC METABOLIC PANEL
ANION GAP: 7 (ref 5–15)
BUN: 25 mg/dL — ABNORMAL HIGH (ref 6–20)
CALCIUM: 9.2 mg/dL (ref 8.9–10.3)
CO2: 26 mmol/L (ref 22–32)
Chloride: 107 mmol/L (ref 101–111)
Creatinine, Ser: 0.79 mg/dL (ref 0.44–1.00)
Glucose, Bld: 87 mg/dL (ref 65–99)
Potassium: 4 mmol/L (ref 3.5–5.1)
Sodium: 140 mmol/L (ref 135–145)

## 2017-11-13 ENCOUNTER — Ambulatory Visit (HOSPITAL_COMMUNITY): Payer: Medicare Other | Admitting: Anesthesiology

## 2017-11-13 ENCOUNTER — Encounter (HOSPITAL_COMMUNITY): Payer: Self-pay

## 2017-11-13 ENCOUNTER — Ambulatory Visit (HOSPITAL_COMMUNITY): Payer: Medicare Other

## 2017-11-13 ENCOUNTER — Ambulatory Visit (HOSPITAL_COMMUNITY)
Admission: RE | Admit: 2017-11-13 | Discharge: 2017-11-13 | Disposition: A | Discharge status: Home / Self Care | Payer: Medicare Other | Source: Ambulatory Visit | Attending: Podiatry | Admitting: Podiatry

## 2017-11-13 ENCOUNTER — Encounter (HOSPITAL_COMMUNITY)
Admission: RE | Disposition: A | Discharge status: Home / Self Care | Payer: Self-pay | Source: Ambulatory Visit | Attending: Podiatry

## 2017-11-13 DIAGNOSIS — M797 Fibromyalgia: Secondary | ICD-10-CM | POA: Diagnosis not present

## 2017-11-13 DIAGNOSIS — K219 Gastro-esophageal reflux disease without esophagitis: Secondary | ICD-10-CM | POA: Diagnosis not present

## 2017-11-13 DIAGNOSIS — F419 Anxiety disorder, unspecified: Secondary | ICD-10-CM | POA: Insufficient documentation

## 2017-11-13 DIAGNOSIS — Z79890 Hormone replacement therapy (postmenopausal): Secondary | ICD-10-CM | POA: Insufficient documentation

## 2017-11-13 DIAGNOSIS — F329 Major depressive disorder, single episode, unspecified: Secondary | ICD-10-CM | POA: Insufficient documentation

## 2017-11-13 DIAGNOSIS — M2041 Other hammer toe(s) (acquired), right foot: Secondary | ICD-10-CM | POA: Insufficient documentation

## 2017-11-13 DIAGNOSIS — Z79891 Long term (current) use of opiate analgesic: Secondary | ICD-10-CM | POA: Diagnosis not present

## 2017-11-13 DIAGNOSIS — I739 Peripheral vascular disease, unspecified: Secondary | ICD-10-CM | POA: Insufficient documentation

## 2017-11-13 DIAGNOSIS — E785 Hyperlipidemia, unspecified: Secondary | ICD-10-CM | POA: Insufficient documentation

## 2017-11-13 DIAGNOSIS — M79674 Pain in right toe(s): Secondary | ICD-10-CM | POA: Diagnosis not present

## 2017-11-13 DIAGNOSIS — M898X7 Other specified disorders of bone, ankle and foot: Secondary | ICD-10-CM | POA: Diagnosis not present

## 2017-11-13 DIAGNOSIS — Z85828 Personal history of other malignant neoplasm of skin: Secondary | ICD-10-CM | POA: Insufficient documentation

## 2017-11-13 DIAGNOSIS — Z79899 Other long term (current) drug therapy: Secondary | ICD-10-CM | POA: Diagnosis not present

## 2017-11-13 DIAGNOSIS — I7300 Raynaud's syndrome without gangrene: Secondary | ICD-10-CM | POA: Insufficient documentation

## 2017-11-13 DIAGNOSIS — G4730 Sleep apnea, unspecified: Secondary | ICD-10-CM | POA: Diagnosis not present

## 2017-11-13 DIAGNOSIS — L851 Acquired keratosis [keratoderma] palmaris et plantaris: Secondary | ICD-10-CM | POA: Insufficient documentation

## 2017-11-13 DIAGNOSIS — M899 Disorder of bone, unspecified: Secondary | ICD-10-CM | POA: Diagnosis not present

## 2017-11-13 DIAGNOSIS — Z791 Long term (current) use of non-steroidal anti-inflammatories (NSAID): Secondary | ICD-10-CM | POA: Insufficient documentation

## 2017-11-13 DIAGNOSIS — M2040 Other hammer toe(s) (acquired), unspecified foot: Secondary | ICD-10-CM | POA: Diagnosis not present

## 2017-11-13 DIAGNOSIS — E039 Hypothyroidism, unspecified: Secondary | ICD-10-CM | POA: Insufficient documentation

## 2017-11-13 DIAGNOSIS — Z7983 Long term (current) use of bisphosphonates: Secondary | ICD-10-CM | POA: Insufficient documentation

## 2017-11-13 DIAGNOSIS — Z9889 Other specified postprocedural states: Secondary | ICD-10-CM

## 2017-11-13 DIAGNOSIS — M216X1 Other acquired deformities of right foot: Secondary | ICD-10-CM | POA: Diagnosis not present

## 2017-11-13 DIAGNOSIS — I10 Essential (primary) hypertension: Secondary | ICD-10-CM | POA: Diagnosis not present

## 2017-11-13 HISTORY — PX: HAMMER TOE SURGERY: SHX385

## 2017-11-13 HISTORY — PX: OSTECTOMY: SHX6439

## 2017-11-13 SURGERY — OSTECTOMY
Anesthesia: Monitor Anesthesia Care | Site: Fourth Toe | Laterality: Right

## 2017-11-13 MED ORDER — LIDOCAINE HCL (PF) 1 % IJ SOLN
INTRAMUSCULAR | Status: DC | PRN
  Administered 2017-11-13: 3 mL

## 2017-11-13 MED ORDER — BUPIVACAINE HCL (PF) 0.5 % IJ SOLN
INTRAMUSCULAR | Status: DC | PRN
  Administered 2017-11-13: 17 mL

## 2017-11-13 MED ORDER — ONDANSETRON HCL 4 MG/2ML IJ SOLN: 4.0000 mg | Freq: Once | INTRAMUSCULAR | Status: DC | PRN

## 2017-11-13 MED ORDER — HYDROCODONE-ACETAMINOPHEN 7.5-325 MG PO TABS: 1.0000 | ORAL_TABLET | Freq: Once | ORAL | Status: DC | PRN

## 2017-11-13 MED ORDER — CHLORHEXIDINE GLUCONATE CLOTH 2 % EX PADS: 6.0000 | MEDICATED_PAD | Freq: Once | CUTANEOUS | Status: DC

## 2017-11-13 MED ORDER — KETOROLAC TROMETHAMINE 30 MG/ML IJ SOLN: 30.0000 mg | Freq: Once | INTRAMUSCULAR | Status: DC | PRN

## 2017-11-13 MED ORDER — BUPIVACAINE HCL (PF) 0.5 % IJ SOLN
INTRAMUSCULAR | Status: AC
  Filled 2017-11-13: qty 30

## 2017-11-13 MED ORDER — CEFAZOLIN SODIUM-DEXTROSE 2-4 GM/100ML-% IV SOLN
2.0000 g | INTRAVENOUS | Status: AC
  Administered 2017-11-13: 2 g via INTRAVENOUS
  Filled 2017-11-13: qty 100

## 2017-11-13 MED ORDER — LACTATED RINGERS IV SOLN: INTRAVENOUS | Status: DC

## 2017-11-13 MED ORDER — HYDROMORPHONE HCL 1 MG/ML IJ SOLN: 0.2500 mg | INTRAMUSCULAR | Status: DC | PRN

## 2017-11-13 MED ORDER — LACTATED RINGERS IV SOLN
INTRAVENOUS | Status: DC
  Administered 2017-11-13: 08:00:00 via INTRAVENOUS

## 2017-11-13 MED ORDER — MEPERIDINE HCL 50 MG/ML IJ SOLN: 6.2500 mg | INTRAMUSCULAR | Status: DC | PRN

## 2017-11-13 MED ORDER — SODIUM CHLORIDE 0.9 % IR SOLN
Status: DC | PRN
  Administered 2017-11-13: 1000 mL

## 2017-11-13 MED ORDER — PROPOFOL 500 MG/50ML IV EMUL
INTRAVENOUS | Status: DC | PRN
  Administered 2017-11-13: 40 ug/kg/min via INTRAVENOUS
  Administered 2017-11-13: 150 ug/kg/min via INTRAVENOUS

## 2017-11-13 MED ORDER — PROPOFOL 10 MG/ML IV BOLUS
INTRAVENOUS | Status: DC | PRN
  Administered 2017-11-13: 20 mg via INTRAVENOUS

## 2017-11-13 MED ORDER — LIDOCAINE HCL (PF) 1 % IJ SOLN
INTRAMUSCULAR | Status: AC
Start: 2017-11-13 — End: ?
  Filled 2017-11-13: qty 30

## 2017-11-13 SURGICAL SUPPLY — 58 items
APL SKNCLS STERI-STRIP NONHPOA (GAUZE/BANDAGES/DRESSINGS) ×1
BANDAGE ELASTIC 4 LF NS (GAUZE/BANDAGES/DRESSINGS) ×4 IMPLANT
BANDAGE ESMARK 4X12 BL STRL LF (DISPOSABLE) ×2 IMPLANT
BENZOIN TINCTURE PRP APPL 2/3 (GAUZE/BANDAGES/DRESSINGS) ×4 IMPLANT
BLADE OSC/SAG 11.5X5.5X.38 (BLADE) ×4 IMPLANT
BLADE OSC/SAG 18.5X9 THN (BLADE) ×4 IMPLANT
BLADE OSC/SAGITTAL MD 5.5X18 (BLADE) ×4 IMPLANT
BLADE SURG 15 STRL LF DISP TIS (BLADE) ×4 IMPLANT
BLADE SURG 15 STRL SS (BLADE) ×4
BNDG CMPR 12X4 ELC STRL LF (DISPOSABLE) ×1
BNDG CONFORM 2 STRL LF (GAUZE/BANDAGES/DRESSINGS) ×4 IMPLANT
BNDG ESMARK 4X12 BLUE STRL LF (DISPOSABLE) ×4
BNDG GAUZE ELAST 4 BULKY (GAUZE/BANDAGES/DRESSINGS) ×4 IMPLANT
BOOT STEPPER DURA MED (SOFTGOODS) IMPLANT
BOOT STEPPER DURA SM (SOFTGOODS) IMPLANT
BUR FAST CUTTING (BURR) ×2
BUR SRGRND 54.5X2.4X8 (BURR) ×2 IMPLANT
BURR SRGRND 54.5X2.4X8 (BURR) ×2
CHLORAPREP W/TINT 26ML (MISCELLANEOUS) ×4 IMPLANT
CLOSURE WOUND 1/2 X4 (GAUZE/BANDAGES/DRESSINGS) ×1
CLOTH BEACON ORANGE TIMEOUT ST (SAFETY) ×4 IMPLANT
COVER LIGHT HANDLE STERIS (MISCELLANEOUS) ×8 IMPLANT
CUFF TOURNIQUET SINGLE 18IN (TOURNIQUET CUFF) ×4 IMPLANT
DECANTER SPIKE VIAL GLASS SM (MISCELLANEOUS) ×4 IMPLANT
DRAPE OEC MINIVIEW 54X84 (DRAPES) ×4 IMPLANT
DRSG ADAPTIC 3X8 NADH LF (GAUZE/BANDAGES/DRESSINGS) ×4 IMPLANT
ELECT REM PT RETURN 9FT ADLT (ELECTROSURGICAL) ×4
ELECTRODE REM PT RTRN 9FT ADLT (ELECTROSURGICAL) ×2 IMPLANT
GAUZE SPONGE 4X4 12PLY STRL (GAUZE/BANDAGES/DRESSINGS) ×4 IMPLANT
GLOVE BIO SURGEON STRL SZ7 (GLOVE) ×4 IMPLANT
GLOVE BIO SURGEON STRL SZ7.5 (GLOVE) ×4 IMPLANT
GLOVE BIOGEL PI IND STRL 7.0 (GLOVE) ×6 IMPLANT
GLOVE BIOGEL PI INDICATOR 7.0 (GLOVE) ×6
GLOVE ECLIPSE 6.5 STRL STRAW (GLOVE) ×4 IMPLANT
GLOVE ECLIPSE 7.0 STRL STRAW (GLOVE) ×4 IMPLANT
GOWN STRL REUS W/ TWL LRG LVL3 (GOWN DISPOSABLE) ×2 IMPLANT
GOWN STRL REUS W/ TWL XL LVL3 (GOWN DISPOSABLE) ×2 IMPLANT
GOWN STRL REUS W/TWL LRG LVL3 (GOWN DISPOSABLE) ×14 IMPLANT
GOWN STRL REUS W/TWL XL LVL3 (GOWN DISPOSABLE) ×2
K-WIRE 6 (WIRE)
KIT TURNOVER CYSTO (KITS) ×4 IMPLANT
KIT TURNOVER KIT A (KITS) ×4 IMPLANT
KWIRE 6 (WIRE) IMPLANT
MANIFOLD NEPTUNE II (INSTRUMENTS) ×4 IMPLANT
NEEDLE HYPO 27GX1-1/4 (NEEDLE) ×12 IMPLANT
NS IRRIG 1000ML POUR BTL (IV SOLUTION) ×4 IMPLANT
PACK BASIC LIMB (CUSTOM PROCEDURE TRAY) ×4 IMPLANT
PAD ARMBOARD 7.5X6 YLW CONV (MISCELLANEOUS) ×4 IMPLANT
PIN CAPS ORTHO GREEN .062 (PIN) IMPLANT
SET BASIN LINEN APH (SET/KITS/TRAYS/PACK) ×4 IMPLANT
SPONGE LAP 18X18 X RAY DECT (DISPOSABLE) ×4 IMPLANT
STRIP CLOSURE SKIN 1/2X4 (GAUZE/BANDAGES/DRESSINGS) ×3 IMPLANT
SUT ETHILON 4 0 P 3 18 (SUTURE) ×4 IMPLANT
SUT PROLENE 4 0 PS 2 18 (SUTURE) ×8 IMPLANT
SUT VIC AB 4-0 PS2 27 (SUTURE) ×4 IMPLANT
SUT VICRYL AB 3-0 FS1 BRD 27IN (SUTURE) IMPLANT
SYR CONTROL 10ML LL (SYRINGE) ×12 IMPLANT
TOWEL OR 17X26 4PK STRL BLUE (TOWEL DISPOSABLE) ×4 IMPLANT

## 2017-11-13 NOTE — Op Note (Signed)
OPERATIVE NOTE  DATE OF PROCEDURE 11/13/2017  SURGEON Marcheta Grammes, DPM  ASSISTANT SURGEON Jilda Panda, DPM  OR STAFF Circulator: Cox, Gershon Mussel, RN Scrub Person: Lucie Leather, CST   PREOPERATIVE DIAGNOSIS 1.  Keratoderma, right foot 2.  Hammertoe deformity fourth digit, right foot 3.  Hammertoe deformity fifth digit, right foot 4.  Exostosis fifth digit, right foot 5.  Pain, right foot   POSTOPERATIVE DIAGNOSIS Same  PROCEDURE 1.  Arthroplasty of the fourth digit, right foot 2.  Derotational arthroplasty of the fifth digit, right foot 3.  Ostectomy fifth digit, right foot   ANESTHESIA Monitor Anesthesia Care   HEMOSTASIS Pneumatic ankle tourniquet set at 250 mmHg  ESTIMATED BLOOD LOSS Minimal (<5 cc)  MATERIALS USED None  INJECTABLES 0.5% Marcaine plain  PATHOLOGY None  COMPLICATIONS None  INDICATIONS:  Painful recurrent lesion of right fourth webspace.  DESCRIPTION OF THE PROCEDURE:  The patient was brought to the operating room and placed on the operative table in the supine position.  A pneumatic ankle tourniquet was applied to the operative extremity.  Following sedation, the surgical site was anesthetized with 0.5% Marcaine plain.  The foot was then prepped, scrubbed, and draped in the usual sterile technique.  The foot was elevated, exsanguinated and the pneumatic ankle tourniquet inflated to 250 mmHg.    Attention was directed to the dorsal aspect of the right fourth toe.  A linear longitudinal incision was made along the dorsal aspect of the fourth toe.  Dissection was continued deep down to the level of the proximal interphalangeal joint.  A transverse tenotomy and capsulotomy was performed.  The head of the proximal phalanx was freed of all soft tissue attachments.  The head of the proximal phalanx was resected using a power bone saw.  All rough edges were smoothed with a hand rasp.  The surgical wound was irrigated with copious amounts of  sterile irrigant.  The extensor tendon was reapproximated using 4-0 Vicryl.  The skin was reapproximated using 4-0 Prolene.  Attention was directed to the dorsal aspect of the right fifth toe where 2 converging some elliptical incisions were made oriented from a proximal lateral to distal medial direction.  The wedge of skin was removed and passed from the operative field.  Dissection was continued deep down to the level of the proximal phalangeal joint.  A transverse tenotomy capsulotomy was performed.  The head of the proximal phalanx was resected using a power bone saw.  All rough edges were smoothed with hand rasp.  The toe was derotated.  The extensor tendon was reapproximated using 4-0 Vicryl.  The skin was closed with 4-0 Prolene.   Attention was directed to the medial aspect of the distal right fifth toe.  A linear longitudinal incision was made.  Dissection was continued deep down to the level of the middle and distal phalanges.  A power bur was used to smooth the prominent medial eminence of the distal and middle phalanges.  The surgical wound was irrigated with copious amounts of sterile irrigant.  The skin was reapproximated using 4-0 Prolene.  A sterile compressive dressing was applied to the right foot.  The pneumatic ankle tourniquet was deflated and a prompt hyperemic response was noted to all digits of the right foot.   The patient tolerated the procedure well.  The patient was then transferred to PACU with vital signs stable and vascular status intact to all toes of the operative foot.

## 2017-11-13 NOTE — Anesthesia Postprocedure Evaluation (Signed)
Anesthesia Post Note  Patient: Audrey [name]  Procedure(s) Performed: OSTECTOMY RIGHT 5TH TOE (Right Fifth Toe) HAMMER TOE REPAIR RIGHT 4TH TOE AND 5TH TOE (Right Fourth Toe)  Patient location during evaluation: PACU Anesthesia Type: MAC Level of consciousness: awake and alert and patient cooperative Pain management: pain level controlled Vital Signs Assessment: post-procedure vital signs reviewed and stable Respiratory status: spontaneous breathing Cardiovascular status: stable Postop Assessment: no apparent nausea or vomiting Anesthetic complications: no     Last Vitals:  Vitals:   11/13/17 1001 11/13/17 1032  BP: 108/77 140/71  Pulse: 90 82  Resp: 19   Temp: 37.1 C 36.6 C  SpO2: 96% 93%    Last Pain:  Vitals:   11/13/17 1032  TempSrc: Oral  PainSc: 2                  ,

## 2017-11-13 NOTE — Transfer of Care (Signed)
Immediate Anesthesia Transfer of Care Note  Patient: Audrey [name]  Procedure(s) Performed: OSTECTOMY RIGHT 5TH TOE (Right Fifth Toe) HAMMER TOE REPAIR RIGHT 4TH TOE AND 5TH TOE (Right Fourth Toe)  Patient Location: PACU  Anesthesia Type:MAC  Level of Consciousness: awake, alert  and patient cooperative  Airway & Oxygen Therapy: Patient Spontanous Breathing  Post-op Assessment: Report given to RN and Post -op Vital signs reviewed and stable  Post vital signs: Reviewed and stable  Last Vitals:  Vitals Value Taken Time  BP    Temp    Pulse 92 11/13/2017 10:03 AM  Resp 16 11/13/2017 10:03 AM  SpO2 95 % 11/13/2017 10:03 AM  Vitals shown include unvalidated device data.  Last Pain:  Vitals:   11/13/17 0725  TempSrc: Oral  PainSc: 4          Complications: No apparent anesthesia complications

## 2017-11-13 NOTE — Anesthesia Procedure Notes (Signed)
Procedure Name: MAC Date/Time: 11/13/2017 8:39 AM Performed by: Vista Deck, CRNA Pre-anesthesia Checklist: Patient identified, Emergency Drugs available, Suction available, Timeout performed and Patient being monitored Patient Re-evaluated:Patient Re-evaluated prior to induction Oxygen Delivery Method: Nasal Cannula

## 2017-11-13 NOTE — H&P (Signed)
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  The various methods of treatment have been discussed with the patient.  No guarantees were given.  After consideration of risks, benefits and other options for treatment, the patient has consented to surgery.  I have reviewed the patients' chart and labs.    Patient Vitals for the past 24 hrs:  BP Temp Temp src Pulse Resp SpO2 Height Weight  11/13/17 0725 (!) 153/74 97.6 F (36.4 C) Oral 80 20 96 % 5\' 2"  (1.575 m) 173 lb (78.5 kg)    A history and physical examination was performed in my office.  The patient was reexamined.  There have been no changes to this history and physical examination.  Marcheta Grammes, DPM

## 2017-11-13 NOTE — Brief Op Note (Signed)
BRIEF OPERATIVE NOTE  DATE OF PROCEDURE 11/13/2017  SURGEON Marcheta Grammes, DPM  ASSISTANT SURGEON Jilda Panda, DPM  OR STAFF Circulator: Cox, Gershon Mussel, RN Scrub Person: Lucie Leather, CST   PREOPERATIVE DIAGNOSIS 1.  Keratoderma, right foot 2.  Hammertoe deformity fourth digit, right foot 3.  Hammertoe deformity fifth digit, right foot 4.  Exostosis fifth digit, right foot 5.  Pain, right foot   POSTOPERATIVE DIAGNOSIS Same  PROCEDURE 1.  Arthroplasty of the fourth digit, right foot 2.  Derotational arthroplasty of the fifth digit, right foot 3.  Ostectomy fifth digit, right foot   ANESTHESIA Monitor Anesthesia Care   HEMOSTASIS Pneumatic ankle tourniquet set at 250 mmHg  ESTIMATED BLOOD LOSS Minimal (<5 cc)  MATERIALS USED None  INJECTABLES 0.5% Marcaine plain  PATHOLOGY None  COMPLICATIONS None

## 2017-11-13 NOTE — Anesthesia Preprocedure Evaluation (Signed)
Anesthesia Evaluation  Patient identified by MRN, date of birth, ID band Patient awake    Reviewed: Allergy & Precautions, H&P , NPO status , Patient's Chart, lab work & pertinent test results, reviewed documented beta blocker date and time   History of Anesthesia Complications (+) PONV and history of anesthetic complications  Airway Mallampati: III  TM Distance: >3 FB Neck ROM: full    Dental no notable dental hx.    Pulmonary neg pulmonary ROS, shortness of breath, sleep apnea ,    Pulmonary exam normal breath sounds clear to auscultation       Cardiovascular Exercise Tolerance: Good hypertension, + Peripheral Vascular Disease  negative cardio ROS   Rhythm:regular Rate:Normal     Neuro/Psych PSYCHIATRIC DISORDERS Anxiety Depression  Neuromuscular disease negative neurological ROS  negative psych ROS   GI/Hepatic negative GI ROS, Neg liver ROS, GERD  ,  Endo/Other  negative endocrine ROSHypothyroidism   Renal/GU negative Renal ROS  negative genitourinary   Musculoskeletal   Abdominal   Peds  Hematology negative hematology ROS (+)   Anesthesia Other Findings   Reproductive/Obstetrics negative OB ROS                             Anesthesia Physical Anesthesia Plan  ASA: III  Anesthesia Plan: MAC   Post-op Pain Management:    Induction:   PONV Risk Score and Plan:   Airway Management Planned:   Additional Equipment:   Intra-op Plan:   Post-operative Plan:   Informed Consent: I have reviewed the patients History and Physical, chart, labs and discussed the procedure including the risks, benefits and alternatives for the proposed anesthesia with the patient or authorized representative who has indicated his/her understanding and acceptance.   Dental Advisory Given  Plan Discussed with: CRNA  Anesthesia Plan Comments:         Anesthesia Quick Evaluation

## 2017-11-13 NOTE — Discharge Instructions (Signed)
.These instructions will give you an idea of what to expect after surgery and how to manage issues that may arise before your first post op office visit. ° °Pain Management °Pain is best managed by “staying ahead” of it. If pain gets out of control, it is difficult to get it back under control. Local anesthesia that lasts 6-8 hours is used to numb the foot and decrease pain.  For the best pain control, take the pain medication every 4 hours for the first 2 days post op. On the third day pain medication can be taken as needed.  ° °Post Op Nausea °Nausea is common after surgery, so it is managed proactively.  °If prescribed, use the prescribed nausea medication regularly for the first 2 days post op. ° °Bandages °Do not worry if there is blood on the bandage. What looks like a lot of blood on the bandage is actually a small amount. Blood on the dressing spreads out as it is absorbed by the gauze, the same way a drop of water spreads out on a paper towel.  °If the bandages feel wet or dry, stiff and uncomfortable, call the office during office hours and we will schedule a time for you to have the bandage changed.  °Unless you are specifically told otherwise, we will do the first bandage change in the office.  °Keep your bandage dry. If the bandage becomes wet or soiled, notify the office and we will schedule a time to change the bandage. ° °Activity °It is best to spend most of the first 2 days after surgery lying down with the foot elevated above the level of your heart. °You may put weight on your heel while wearing the surgical shoe.   °You may only get up to go to the restroom. ° °Driving °Do not drive until you are able to respond in an emergency (i.e. slam on the brakes). This usually occurs after the bone has healed - 6 to 8 weeks. ° °Call the Office °If you have a fever over 101°F.  °If you have increasing pain after the initial post op pain has settled down.  °If you have increasing redness, swelling, or  drainage.  °If you have any questions or concerns.  ° ° ° ° °Monitored Anesthesia Care, Care After °These instructions provide you with information about caring for yourself after your procedure. Your health care provider may also give you more specific instructions. Your treatment has been planned according to current medical practices, but problems sometimes occur. Call your health care provider if you have any problems or questions after your procedure. °What can I expect after the procedure? °After your procedure, it is common to: °· Feel sleepy for several hours. °· Feel clumsy and have poor balance for several hours. °· Feel forgetful about what happened after the procedure. °· Have poor judgment for several hours. °· Feel nauseous or vomit. °· Have a sore throat if you had a breathing tube during the procedure. ° °Follow these instructions at home: °For at least 24 hours after the procedure: ° °· Do not: °? Participate in activities in which you could fall or become injured. °? Drive. °? Use heavy machinery. °? Drink alcohol. °? Take sleeping pills or medicines that cause drowsiness. °? Make important decisions or sign legal documents. °? Take care of children on your own. °· Rest. °Eating and drinking °· Follow the diet that is recommended by your health care provider. °· If you vomit, drink water, juice,   or soup when you can drink without vomiting. °· Make sure you have little or no nausea before eating solid foods. °General instructions °· Have a responsible adult stay with you until you are awake and alert. °· Take over-the-counter and prescription medicines only as told by your health care provider. °· If you smoke, do not smoke without supervision. °· Keep all follow-up visits as told by your health care provider. This is important. °Contact a health care provider if: °· You keep feeling nauseous or you keep vomiting. °· You feel light-headed. °· You develop a rash. °· You have a fever. °Get help right  away if: °· You have trouble breathing. °This information is not intended to replace advice given to you by your health care provider. Make sure you discuss any questions you have with your health care provider. °Document Released: 08/28/2015 Document Revised: 12/28/2015 Document Reviewed: 08/28/2015 °Elsevier Interactive Patient Education © 2018 Elsevier Inc. ° °

## 2017-11-14 ENCOUNTER — Encounter (HOSPITAL_COMMUNITY): Payer: Self-pay | Admitting: Podiatry

## 2017-11-14 DIAGNOSIS — Z6833 Body mass index (BMI) 33.0-33.9, adult: Secondary | ICD-10-CM | POA: Diagnosis not present

## 2017-11-14 DIAGNOSIS — Z1389 Encounter for screening for other disorder: Secondary | ICD-10-CM | POA: Diagnosis not present

## 2017-11-14 DIAGNOSIS — E6609 Other obesity due to excess calories: Secondary | ICD-10-CM | POA: Diagnosis not present

## 2017-11-14 DIAGNOSIS — N342 Other urethritis: Secondary | ICD-10-CM | POA: Diagnosis not present

## 2017-11-18 DIAGNOSIS — Z09 Encounter for follow-up examination after completed treatment for conditions other than malignant neoplasm: Secondary | ICD-10-CM | POA: Diagnosis not present

## 2017-11-18 DIAGNOSIS — H04552 Acquired stenosis of left nasolacrimal duct: Secondary | ICD-10-CM | POA: Diagnosis not present

## 2017-11-18 DIAGNOSIS — H04123 Dry eye syndrome of bilateral lacrimal glands: Secondary | ICD-10-CM | POA: Diagnosis not present

## 2017-11-18 DIAGNOSIS — H04551 Acquired stenosis of right nasolacrimal duct: Secondary | ICD-10-CM | POA: Diagnosis not present

## 2017-11-18 DIAGNOSIS — J340 Abscess, furuncle and carbuncle of nose: Secondary | ICD-10-CM | POA: Diagnosis not present

## 2017-11-18 DIAGNOSIS — H04223 Epiphora due to insufficient drainage, bilateral lacrimal glands: Secondary | ICD-10-CM | POA: Diagnosis not present

## 2017-11-18 DIAGNOSIS — H04221 Epiphora due to insufficient drainage, right lacrimal gland: Secondary | ICD-10-CM | POA: Diagnosis not present

## 2017-12-09 ENCOUNTER — Encounter: Payer: Self-pay | Admitting: Pulmonary Disease

## 2017-12-09 ENCOUNTER — Ambulatory Visit (INDEPENDENT_AMBULATORY_CARE_PROVIDER_SITE_OTHER): Payer: Medicare Other | Admitting: Pulmonary Disease

## 2017-12-09 VITALS — BP 126/82 | HR 91 | Ht 60.0 in | Wt 177.8 lb

## 2017-12-09 DIAGNOSIS — G4733 Obstructive sleep apnea (adult) (pediatric): Secondary | ICD-10-CM | POA: Diagnosis not present

## 2017-12-09 DIAGNOSIS — G4734 Idiopathic sleep related nonobstructive alveolar hypoventilation: Secondary | ICD-10-CM

## 2017-12-09 NOTE — Assessment & Plan Note (Signed)
Schedule home sleep study.  Based on this, we will decide whether we need CPAP or oxygen and we will help you change DME company from [place] to [employer]

## 2017-12-09 NOTE — Progress Notes (Signed)
Subjective:    Patient ID: Audrey [name], female    DOB: 08/24/1940, 77 y.o.   MRN: [id number]  HPI  Chief Complaint  Patient presents with  . Consult    sleep consult-had sleep study 10 years ago-not on cpap only O2 2L nasal cannula at night   77 year old never smoker presents for evaluation of nocturnal hypoxia.  She reports undergoing a polysomnogram about 10 years ago and was told that she has mild OSA.  She was placed on CPAP with a full facemask which she did not tolerate.  Heart a CPAP download was reviewed which shows good control of events and 6.5 cm but large leak.  She abandoned using the CPAP shortly after.  In 2012 she was evaluated and found to have nocturnal hypoxia with saturations in the 80s.  She was placed on 2 L of oxygen with good improvement in saturations. She feels her oxygen has helped her over the years and has kept her more refreshed however lately she has found decreased energy and excessive daytime somnolence and fatigue. Next Epworth sleepiness score is 12 and she reports sleepiness as a passenger in a car or lying down to rest in the afternoons.  Bedtime is between 10:11 PM, sleep latency between 5 to 10 minutes, she sleeps on her right side with one pillow, reports 1-2 nocturnal awakenings including nocturia and is out of bed by 6:30 AM feeling refreshed without dryness of mouth or headaches. She naps occasionally in the afternoons but does not feel refreshed on waking up. She has gained about 10 pounds in the last 2 years.  Oxygen supplier was [employer] and then this was changed to [employer] from [place].  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 Allergic rhinitis   . Anxiety and depression   . Arthritis   . Basal cell carcinoma 2002  . Colon polyp   . Depression   . Diverticulosis   . Dyspnea    with excertion  . Family history of heart disease   . Fibromyalgia   . Gastric polyps   . GERD (gastroesophageal reflux disease)   . History of nuclear stress test 04/2010   nonischemic   . HLD (hyperlipidemia)   . HTN (hypertension)   . Hypothyroidism   . Meniere's disease   . Obesity   . Pinched vertebral nerve   . PONV (postoperative nausea and vomiting)   . Raynaud's syndrome   . Right hip pain 08/18/2012  . Sleep apnea   . Tuberculosis    postive skin test no symptoms  was around mom that had it   Past Surgical History:  Procedure Laterality Date  . ABDOMINAL HYSTERECTOMY  1982  . BASAL CELL CARCINOMA EXCISION  03-31-01   face  . BLADDER REPAIR  2005   ovaries, tubes removed; pelvic prolapse corrected; rectal repair  . BLADDER SURGERY  01-2009   with mesh placement  . CATARACT EXTRACTION     bilateral  . COLONOSCOPY W/ BIOPSIES AND POLYPECTOMY  12/20/2009   diverticulosis, adenomatous polyps  . COLONOSCOPY WITH PROPOFOL N/A 08/26/2017   Procedure: COLONOSCOPY WITH PROPOFOL;  Surgeon: Gatha Mayer, MD;  Location: [facility];  Service: Endoscopy;  Laterality: N/A;  . ENDOVENOUS ABLATION SAPHENOUS VEIN W/ LASER Left 08/07/2016   endovenous laser ablation left small saphenous vein by Tinnie Gens MD  . ESOPHAGEAL MANOMETRY N/A 05/25/2013   Procedure: ESOPHAGEAL MANOMETRY (EM);  Surgeon: Gatha Mayer, MD;  Location: [facility];  Service: Endoscopy;  Laterality: N/A;  . ESOPHAGOGASTRODUODENOSCOPY (EGD) WITH ESOPHAGEAL DILATION  06/09/2012   Procedure: ESOPHAGOGASTRODUODENOSCOPY (EGD) WITH ESOPHAGEAL DILATION;  Surgeon: Gatha Mayer, MD;  Location: [facility];  Service: Endoscopy;  Laterality: N/A;  . FOOT SURGERY  1999   bilateral  . HAMMER TOE SURGERY Right 11/13/2017   Procedure: HAMMER TOE  REPAIR RIGHT 4TH TOE AND 5TH TOE;  Surgeon: Caprice Beaver, DPM;  Location: [facility];  Service: Podiatry;  Laterality: Right;  . INTERSTIM IMPLANT PLACEMENT  01/2012   [facility]  . laser surgery bladder  05-25-10   on mesh  . LIPOMA EXCISION     right arm  . NASOLACRIMAL DUCT PROBING  09-12-10   right eye  . NASOLACRIMAL DUCT PROBING  09-26-10   left eye  . OSTECTOMY Right 11/13/2017   Procedure: OSTECTOMY RIGHT 5TH TOE;  Surgeon: Caprice Beaver, DPM;  Location: [facility];  Service: Podiatry;  Laterality: Right;  . removal of bladder mesh  03-01-11   [facility]  . TONSILLECTOMY  1959  . TRANSTHORACIC ECHOCARDIOGRAM  12/2006   EF=>55%; LA mild-mod dilated; RA mildly dilated; mild mitral annular calcif, mild MR; mod TR & elevated RVSP; mild pulm vavle regurg  . UPPER GASTROINTESTINAL ENDOSCOPY  12/20/2009   GERD    Allergies  Allergen Reactions  . Statins Other (See Comments)    Makes her hurt bad  . Sulfa Antibiotics Rash and Hives  . Sulfamethoxazole-Trimethoprim Hives and Rash  . Tramadol Nausea And Vomiting, Nausea Only and Other (See Comments)    Dizziness, medicine has her dizzy and nauseate   . Erythromycin Diarrhea  . Other Hives and Other (See Comments)    Allergen - EKG electrodes  . Vytorin [Ezetimibe-Simvastatin] Hives  . Adhesive [Tape] Rash     Social History   Socioeconomic History  . Marital status: Married    Spouse name: Not on file  . Number of children: 3  . Years of education: Not on file  . Highest education level: Not on file  Occupational History  . Occupation: retired - [employer]  Social Needs  . Financial resource strain: Not on file  . Food insecurity:    Worry: Not on file    Inability: Not on file  . Transportation needs:    Medical: Not on file    Non-medical: Not on file  Tobacco Use  . Smoking status: Never Smoker  . Smokeless tobacco: Never Used  Substance and Sexual Activity  . Alcohol use: No  . Drug use: No  . Sexual activity:  Not Currently  Lifestyle  . Physical activity:    Days per week: Not on file    Minutes per session: Not on file  . Stress: Not on file  Relationships  . Social connections:    Talks on phone: Not on file    Gets together: Not on file    Attends religious service: Not on file    Active member of club or organization: Not on file    Attends meetings of clubs or organizations: Not on file    Relationship status: Not on file  . Intimate partner violence:  Fear of current or ex partner: Not on file    Emotionally abused: Not on file    Physically abused: Not on file    Forced sexual activity: Not on file  Other Topics Concern  . Not on file  Social History Narrative   Married   1 son and 2 daughters, 10 grandchildren   Remains active      Family History  Problem Relation Age of Onset  . Pancreatic cancer Mother   . Diabetes Mother   . Tuberculosis Mother   . Prostate cancer Father   . Colon polyps Father   . Diabetes Father   . Hyperlipidemia Father   . Hypertension Father        also kidney disease  . Heart disease Father        CABG, twice  . Diabetes Maternal Grandmother   . Colon cancer Maternal Grandfather   . Clotting disorder Paternal Grandmother        blood disorder  . Heart disease Paternal Grandfather   . Stroke Brother   . Diabetes Brother   . Breast cancer Maternal Aunt        x 4  . Colon cancer Maternal Uncle   . Von Willebrand disease Daughter   . Von Willebrand disease Daughter   . Hypertension Son   . Crohn's disease Grandchild   . Clotting disorder Grandchild        x3     Review of Systems  Constitutional: Positive for unexpected weight change.  HENT: Positive for sneezing.   Respiratory: Positive for cough and shortness of breath.   Cardiovascular: Positive for leg swelling.       Objective:   Physical Exam  Gen. Pleasant, obese, in no distress, normal affect ENT - no lesions, no post nasal drip, class 2-3 airway Neck: No  JVD, no thyromegaly, no carotid bruits Lungs: no use of accessory muscles, no dullness to percussion, decreased without rales or rhonchi  Cardiovascular: Rhythm regular, heart sounds  normal, no murmurs or gallops, no peripheral edema Abdomen: soft and non-tender, no hepatosplenomegaly, BS normal. Musculoskeletal: No deformities, no cyanosis or clubbing Neuro:  alert, non focal, no tremors       Assessment & Plan:

## 2017-12-09 NOTE — Patient Instructions (Signed)
Schedule home sleep study.  Based on this, we will decide whether we need CPAP or oxygen and we will help you change DME company from [place] to [employer]

## 2017-12-13 DIAGNOSIS — Z4889 Encounter for other specified surgical aftercare: Secondary | ICD-10-CM | POA: Diagnosis not present

## 2017-12-18 DIAGNOSIS — D519 Vitamin B12 deficiency anemia, unspecified: Secondary | ICD-10-CM | POA: Diagnosis not present

## 2017-12-23 DIAGNOSIS — G4733 Obstructive sleep apnea (adult) (pediatric): Secondary | ICD-10-CM | POA: Diagnosis not present

## 2017-12-24 DIAGNOSIS — D1801 Hemangioma of skin and subcutaneous tissue: Secondary | ICD-10-CM | POA: Diagnosis not present

## 2017-12-24 DIAGNOSIS — B353 Tinea pedis: Secondary | ICD-10-CM | POA: Diagnosis not present

## 2017-12-24 DIAGNOSIS — I8310 Varicose veins of unspecified lower extremity with inflammation: Secondary | ICD-10-CM | POA: Diagnosis not present

## 2017-12-24 DIAGNOSIS — L814 Other melanin hyperpigmentation: Secondary | ICD-10-CM | POA: Diagnosis not present

## 2017-12-24 DIAGNOSIS — D229 Melanocytic nevi, unspecified: Secondary | ICD-10-CM | POA: Diagnosis not present

## 2017-12-24 DIAGNOSIS — G4733 Obstructive sleep apnea (adult) (pediatric): Secondary | ICD-10-CM

## 2017-12-24 DIAGNOSIS — L570 Actinic keratosis: Secondary | ICD-10-CM | POA: Diagnosis not present

## 2017-12-24 DIAGNOSIS — Z85828 Personal history of other malignant neoplasm of skin: Secondary | ICD-10-CM | POA: Diagnosis not present

## 2017-12-24 DIAGNOSIS — L821 Other seborrheic keratosis: Secondary | ICD-10-CM | POA: Diagnosis not present

## 2017-12-25 ENCOUNTER — Other Ambulatory Visit: Payer: Self-pay | Admitting: *Deleted

## 2017-12-25 DIAGNOSIS — H04552 Acquired stenosis of left nasolacrimal duct: Secondary | ICD-10-CM | POA: Diagnosis not present

## 2017-12-25 DIAGNOSIS — H04223 Epiphora due to insufficient drainage, bilateral lacrimal glands: Secondary | ICD-10-CM | POA: Diagnosis not present

## 2017-12-25 DIAGNOSIS — H04551 Acquired stenosis of right nasolacrimal duct: Secondary | ICD-10-CM | POA: Diagnosis not present

## 2017-12-25 DIAGNOSIS — Z09 Encounter for follow-up examination after completed treatment for conditions other than malignant neoplasm: Secondary | ICD-10-CM | POA: Diagnosis not present

## 2017-12-25 DIAGNOSIS — H04221 Epiphora due to insufficient drainage, right lacrimal gland: Secondary | ICD-10-CM | POA: Diagnosis not present

## 2017-12-25 DIAGNOSIS — G4733 Obstructive sleep apnea (adult) (pediatric): Secondary | ICD-10-CM

## 2017-12-26 ENCOUNTER — Telehealth: Payer: Self-pay | Admitting: Pulmonary Disease

## 2017-12-26 DIAGNOSIS — G4733 Obstructive sleep apnea (adult) (pediatric): Secondary | ICD-10-CM

## 2017-12-26 NOTE — Telephone Encounter (Signed)
Per RA, HST showed mild OSA with 6 events per hour. Does not need CPAP. Ok to continue on O2 although O2 drop was very mild. Ok to switch DME.

## 2017-12-31 NOTE — Telephone Encounter (Signed)
Called and spoke with pt regarding RA recommendations Advised pt okay to con't 2L of O2 with [place] order today Pt verbalized understanding Nothing further needed.

## 2017-12-31 NOTE — Telephone Encounter (Signed)
Spoke with patient. She is ok with not being on a CPAP machine. She is currently on 2L of O2 at night and wants to know if it is ok for her to stay at 2L. She wishes to have the O2 order sent to [facility] since they are taking Medicare patients again.   RA, please advise on the 2L of O2 setting. Thanks!

## 2017-12-31 NOTE — Telephone Encounter (Signed)
OK to ct 2 L O2

## 2018-01-14 DIAGNOSIS — D519 Vitamin B12 deficiency anemia, unspecified: Secondary | ICD-10-CM | POA: Diagnosis not present

## 2018-01-28 DIAGNOSIS — E538 Deficiency of other specified B group vitamins: Secondary | ICD-10-CM | POA: Diagnosis not present

## 2018-01-28 DIAGNOSIS — Z23 Encounter for immunization: Secondary | ICD-10-CM | POA: Diagnosis not present

## 2018-01-28 DIAGNOSIS — G894 Chronic pain syndrome: Secondary | ICD-10-CM | POA: Diagnosis not present

## 2018-01-28 DIAGNOSIS — M1991 Primary osteoarthritis, unspecified site: Secondary | ICD-10-CM | POA: Diagnosis not present

## 2018-01-28 DIAGNOSIS — Z6834 Body mass index (BMI) 34.0-34.9, adult: Secondary | ICD-10-CM | POA: Diagnosis not present

## 2018-01-28 DIAGNOSIS — E063 Autoimmune thyroiditis: Secondary | ICD-10-CM | POA: Diagnosis not present

## 2018-01-28 DIAGNOSIS — M797 Fibromyalgia: Secondary | ICD-10-CM | POA: Diagnosis not present

## 2018-01-28 DIAGNOSIS — M5417 Radiculopathy, lumbosacral region: Secondary | ICD-10-CM | POA: Diagnosis not present

## 2018-01-28 DIAGNOSIS — E6609 Other obesity due to excess calories: Secondary | ICD-10-CM | POA: Diagnosis not present

## 2018-01-28 DIAGNOSIS — E782 Mixed hyperlipidemia: Secondary | ICD-10-CM | POA: Diagnosis not present

## 2018-01-31 ENCOUNTER — Telehealth: Payer: Self-pay | Admitting: Pulmonary Disease

## 2018-01-31 NOTE — Telephone Encounter (Signed)
Called and spoke to patient. Patient stated that she doesn't have oxygen yet and that [facility] would like the sleep study/ONO or anything needed sent to them and for it to say needing at night not continuous.   Patient requested that information get faxed to [facility] at [phone] with attention to Cherylann Parr.   Routing to [place] since she had been working on this order.

## 2018-01-31 NOTE — Telephone Encounter (Signed)
I have faxed this info to [facility]

## 2018-02-07 DIAGNOSIS — Z6834 Body mass index (BMI) 34.0-34.9, adult: Secondary | ICD-10-CM | POA: Diagnosis not present

## 2018-02-07 DIAGNOSIS — M5431 Sciatica, right side: Secondary | ICD-10-CM | POA: Diagnosis not present

## 2018-02-07 DIAGNOSIS — E6609 Other obesity due to excess calories: Secondary | ICD-10-CM | POA: Diagnosis not present

## 2018-02-20 ENCOUNTER — Other Ambulatory Visit: Payer: Self-pay

## 2018-02-20 MED ORDER — PANTOPRAZOLE SODIUM 40 MG PO TBEC: DELAYED_RELEASE_TABLET | ORAL | 5 refills | Status: DC

## 2018-02-20 NOTE — Telephone Encounter (Signed)
Pantoprazole refilled as requested. 

## 2018-03-11 DIAGNOSIS — N342 Other urethritis: Secondary | ICD-10-CM | POA: Diagnosis not present

## 2018-03-11 DIAGNOSIS — Z6834 Body mass index (BMI) 34.0-34.9, adult: Secondary | ICD-10-CM | POA: Diagnosis not present

## 2018-03-11 DIAGNOSIS — G894 Chronic pain syndrome: Secondary | ICD-10-CM | POA: Diagnosis not present

## 2018-03-11 DIAGNOSIS — E538 Deficiency of other specified B group vitamins: Secondary | ICD-10-CM | POA: Diagnosis not present

## 2018-03-21 DIAGNOSIS — Z4889 Encounter for other specified surgical aftercare: Secondary | ICD-10-CM | POA: Diagnosis not present

## 2018-03-28 DIAGNOSIS — N342 Other urethritis: Secondary | ICD-10-CM | POA: Diagnosis not present

## 2018-03-28 DIAGNOSIS — N390 Urinary tract infection, site not specified: Secondary | ICD-10-CM | POA: Diagnosis not present

## 2018-03-28 DIAGNOSIS — E6609 Other obesity due to excess calories: Secondary | ICD-10-CM | POA: Diagnosis not present

## 2018-03-28 DIAGNOSIS — Z6834 Body mass index (BMI) 34.0-34.9, adult: Secondary | ICD-10-CM | POA: Diagnosis not present

## 2018-04-26 DIAGNOSIS — M545 Low back pain: Secondary | ICD-10-CM | POA: Diagnosis not present

## 2018-04-28 DIAGNOSIS — Z1389 Encounter for screening for other disorder: Secondary | ICD-10-CM | POA: Diagnosis not present

## 2018-04-28 DIAGNOSIS — E6609 Other obesity due to excess calories: Secondary | ICD-10-CM | POA: Diagnosis not present

## 2018-04-28 DIAGNOSIS — L0390 Cellulitis, unspecified: Secondary | ICD-10-CM | POA: Diagnosis not present

## 2018-04-28 DIAGNOSIS — N342 Other urethritis: Secondary | ICD-10-CM | POA: Diagnosis not present

## 2018-04-28 DIAGNOSIS — Z6834 Body mass index (BMI) 34.0-34.9, adult: Secondary | ICD-10-CM | POA: Diagnosis not present

## 2018-04-28 DIAGNOSIS — D511 Vitamin B12 deficiency anemia due to selective vitamin B12 malabsorption with proteinuria: Secondary | ICD-10-CM | POA: Diagnosis not present

## 2018-05-15 DIAGNOSIS — M1612 Unilateral primary osteoarthritis, left hip: Secondary | ICD-10-CM | POA: Diagnosis not present

## 2018-05-15 DIAGNOSIS — M25552 Pain in left hip: Secondary | ICD-10-CM | POA: Diagnosis not present

## 2018-06-04 DIAGNOSIS — M1612 Unilateral primary osteoarthritis, left hip: Secondary | ICD-10-CM | POA: Diagnosis not present

## 2018-06-10 DIAGNOSIS — N342 Other urethritis: Secondary | ICD-10-CM | POA: Diagnosis not present

## 2018-06-10 DIAGNOSIS — N390 Urinary tract infection, site not specified: Secondary | ICD-10-CM | POA: Diagnosis not present

## 2018-06-10 DIAGNOSIS — R350 Frequency of micturition: Secondary | ICD-10-CM | POA: Diagnosis not present

## 2018-06-10 DIAGNOSIS — Z681 Body mass index (BMI) 19 or less, adult: Secondary | ICD-10-CM | POA: Diagnosis not present

## 2018-06-10 DIAGNOSIS — E538 Deficiency of other specified B group vitamins: Secondary | ICD-10-CM | POA: Diagnosis not present

## 2018-06-17 DIAGNOSIS — M1612 Unilateral primary osteoarthritis, left hip: Secondary | ICD-10-CM | POA: Diagnosis not present

## 2018-06-17 DIAGNOSIS — M48061 Spinal stenosis, lumbar region without neurogenic claudication: Secondary | ICD-10-CM | POA: Diagnosis not present

## 2018-06-18 ENCOUNTER — Other Ambulatory Visit: Payer: Self-pay | Admitting: Orthopedic Surgery

## 2018-06-18 ENCOUNTER — Telehealth: Payer: Self-pay | Admitting: Nurse Practitioner

## 2018-06-18 DIAGNOSIS — M1612 Unilateral primary osteoarthritis, left hip: Secondary | ICD-10-CM

## 2018-06-18 NOTE — Telephone Encounter (Signed)
Phone call to patient to verify medication list and allergies for myelogram procedure. Pt instructed to hold savella for 48hrs prior to myelogram appointment time. Pt verbalized understanding.

## 2018-06-26 ENCOUNTER — Ambulatory Visit
Admission: RE | Admit: 2018-06-26 | Discharge: 2018-06-26 | Disposition: A | Discharge status: Home / Self Care | Payer: Medicare Other | Source: Ambulatory Visit | Attending: Orthopedic Surgery | Admitting: Orthopedic Surgery

## 2018-06-26 VITALS — BP 164/67 | HR 96

## 2018-06-26 DIAGNOSIS — M1612 Unilateral primary osteoarthritis, left hip: Secondary | ICD-10-CM

## 2018-06-26 DIAGNOSIS — M5442 Lumbago with sciatica, left side: Principal | ICD-10-CM

## 2018-06-26 DIAGNOSIS — M5441 Lumbago with sciatica, right side: Secondary | ICD-10-CM

## 2018-06-26 DIAGNOSIS — M48061 Spinal stenosis, lumbar region without neurogenic claudication: Secondary | ICD-10-CM | POA: Diagnosis not present

## 2018-06-26 MED ORDER — IOPAMIDOL (ISOVUE-M 200) INJECTION 41%
20.0000 mL | Freq: Once | INTRAMUSCULAR | Status: AC
  Administered 2018-06-26: 20 mL via INTRATHECAL

## 2018-06-26 MED ORDER — DIAZEPAM 5 MG PO TABS
5.0000 mg | ORAL_TABLET | Freq: Once | ORAL | Status: AC
  Administered 2018-06-26: 5 mg via ORAL

## 2018-06-26 NOTE — Discharge Instructions (Signed)

## 2018-06-26 NOTE — Progress Notes (Signed)
Patient states she has been off [place] for at least the past two days.

## 2018-07-01 DIAGNOSIS — L304 Erythema intertrigo: Secondary | ICD-10-CM | POA: Diagnosis not present

## 2018-07-01 DIAGNOSIS — D225 Melanocytic nevi of trunk: Secondary | ICD-10-CM | POA: Diagnosis not present

## 2018-07-01 DIAGNOSIS — L821 Other seborrheic keratosis: Secondary | ICD-10-CM | POA: Diagnosis not present

## 2018-07-01 DIAGNOSIS — I8311 Varicose veins of right lower extremity with inflammation: Secondary | ICD-10-CM | POA: Diagnosis not present

## 2018-07-01 DIAGNOSIS — M545 Low back pain: Secondary | ICD-10-CM | POA: Diagnosis not present

## 2018-07-01 DIAGNOSIS — L570 Actinic keratosis: Secondary | ICD-10-CM | POA: Diagnosis not present

## 2018-07-01 DIAGNOSIS — I8312 Varicose veins of left lower extremity with inflammation: Secondary | ICD-10-CM | POA: Diagnosis not present

## 2018-07-01 DIAGNOSIS — D1801 Hemangioma of skin and subcutaneous tissue: Secondary | ICD-10-CM | POA: Diagnosis not present

## 2018-07-01 DIAGNOSIS — Z85828 Personal history of other malignant neoplasm of skin: Secondary | ICD-10-CM | POA: Diagnosis not present

## 2018-07-08 DIAGNOSIS — Z1231 Encounter for screening mammogram for malignant neoplasm of breast: Secondary | ICD-10-CM | POA: Diagnosis not present

## 2018-07-08 DIAGNOSIS — Z803 Family history of malignant neoplasm of breast: Secondary | ICD-10-CM | POA: Diagnosis not present

## 2018-08-08 DIAGNOSIS — M79671 Pain in right foot: Secondary | ICD-10-CM | POA: Diagnosis not present

## 2018-08-08 DIAGNOSIS — M79672 Pain in left foot: Secondary | ICD-10-CM | POA: Diagnosis not present

## 2018-08-08 DIAGNOSIS — Q828 Other specified congenital malformations of skin: Secondary | ICD-10-CM | POA: Diagnosis not present

## 2018-08-13 DIAGNOSIS — H04123 Dry eye syndrome of bilateral lacrimal glands: Secondary | ICD-10-CM | POA: Diagnosis not present

## 2018-08-13 DIAGNOSIS — H04221 Epiphora due to insufficient drainage, right lacrimal gland: Secondary | ICD-10-CM | POA: Diagnosis not present

## 2018-08-13 DIAGNOSIS — H40013 Open angle with borderline findings, low risk, bilateral: Secondary | ICD-10-CM | POA: Diagnosis not present

## 2018-08-13 DIAGNOSIS — H2000 Unspecified acute and subacute iridocyclitis: Secondary | ICD-10-CM | POA: Diagnosis not present

## 2018-08-15 DIAGNOSIS — H01024 Squamous blepharitis left upper eyelid: Secondary | ICD-10-CM | POA: Diagnosis not present

## 2018-08-15 DIAGNOSIS — H01022 Squamous blepharitis right lower eyelid: Secondary | ICD-10-CM | POA: Diagnosis not present

## 2018-08-15 DIAGNOSIS — H01021 Squamous blepharitis right upper eyelid: Secondary | ICD-10-CM | POA: Diagnosis not present

## 2018-08-15 DIAGNOSIS — H40013 Open angle with borderline findings, low risk, bilateral: Secondary | ICD-10-CM | POA: Diagnosis not present

## 2018-08-15 DIAGNOSIS — H04123 Dry eye syndrome of bilateral lacrimal glands: Secondary | ICD-10-CM | POA: Diagnosis not present

## 2018-08-15 DIAGNOSIS — H02831 Dermatochalasis of right upper eyelid: Secondary | ICD-10-CM | POA: Diagnosis not present

## 2018-08-15 DIAGNOSIS — H2000 Unspecified acute and subacute iridocyclitis: Secondary | ICD-10-CM | POA: Diagnosis not present

## 2018-08-15 DIAGNOSIS — H02834 Dermatochalasis of left upper eyelid: Secondary | ICD-10-CM | POA: Diagnosis not present

## 2018-08-15 DIAGNOSIS — H01025 Squamous blepharitis left lower eyelid: Secondary | ICD-10-CM | POA: Diagnosis not present

## 2018-08-15 DIAGNOSIS — Z961 Presence of intraocular lens: Secondary | ICD-10-CM | POA: Diagnosis not present

## 2018-08-19 DIAGNOSIS — N390 Urinary tract infection, site not specified: Secondary | ICD-10-CM | POA: Diagnosis not present

## 2018-08-22 DIAGNOSIS — H02834 Dermatochalasis of left upper eyelid: Secondary | ICD-10-CM | POA: Diagnosis not present

## 2018-08-22 DIAGNOSIS — H01021 Squamous blepharitis right upper eyelid: Secondary | ICD-10-CM | POA: Diagnosis not present

## 2018-08-22 DIAGNOSIS — H01022 Squamous blepharitis right lower eyelid: Secondary | ICD-10-CM | POA: Diagnosis not present

## 2018-08-22 DIAGNOSIS — H04123 Dry eye syndrome of bilateral lacrimal glands: Secondary | ICD-10-CM | POA: Diagnosis not present

## 2018-08-22 DIAGNOSIS — H01025 Squamous blepharitis left lower eyelid: Secondary | ICD-10-CM | POA: Diagnosis not present

## 2018-08-22 DIAGNOSIS — H2000 Unspecified acute and subacute iridocyclitis: Secondary | ICD-10-CM | POA: Diagnosis not present

## 2018-08-22 DIAGNOSIS — H01024 Squamous blepharitis left upper eyelid: Secondary | ICD-10-CM | POA: Diagnosis not present

## 2018-08-22 DIAGNOSIS — H02831 Dermatochalasis of right upper eyelid: Secondary | ICD-10-CM | POA: Diagnosis not present

## 2018-09-09 DIAGNOSIS — H43813 Vitreous degeneration, bilateral: Secondary | ICD-10-CM | POA: Diagnosis not present

## 2018-09-09 DIAGNOSIS — H209 Unspecified iridocyclitis: Secondary | ICD-10-CM | POA: Diagnosis not present

## 2018-09-09 DIAGNOSIS — H35033 Hypertensive retinopathy, bilateral: Secondary | ICD-10-CM | POA: Diagnosis not present

## 2018-09-09 DIAGNOSIS — H353132 Nonexudative age-related macular degeneration, bilateral, intermediate dry stage: Secondary | ICD-10-CM | POA: Diagnosis not present

## 2018-09-09 DIAGNOSIS — Z961 Presence of intraocular lens: Secondary | ICD-10-CM | POA: Diagnosis not present

## 2018-09-12 DIAGNOSIS — H209 Unspecified iridocyclitis: Secondary | ICD-10-CM | POA: Diagnosis not present

## 2018-09-26 DIAGNOSIS — M545 Low back pain: Secondary | ICD-10-CM | POA: Diagnosis not present

## 2018-09-26 DIAGNOSIS — M4150 Other secondary scoliosis, site unspecified: Secondary | ICD-10-CM | POA: Diagnosis not present

## 2018-10-08 DIAGNOSIS — Z1389 Encounter for screening for other disorder: Secondary | ICD-10-CM | POA: Diagnosis not present

## 2018-10-08 DIAGNOSIS — Z0000 Encounter for general adult medical examination without abnormal findings: Secondary | ICD-10-CM | POA: Diagnosis not present

## 2018-10-08 DIAGNOSIS — Z681 Body mass index (BMI) 19 or less, adult: Secondary | ICD-10-CM | POA: Diagnosis not present

## 2018-10-11 ENCOUNTER — Other Ambulatory Visit: Payer: Self-pay

## 2018-10-11 ENCOUNTER — Emergency Department (HOSPITAL_COMMUNITY): Payer: Medicare Other

## 2018-10-11 ENCOUNTER — Emergency Department (HOSPITAL_COMMUNITY)
Admission: EM | Admit: 2018-10-11 | Discharge: 2018-10-11 | Disposition: A | Discharge status: Home / Self Care | Payer: Medicare Other | Attending: Emergency Medicine | Admitting: Emergency Medicine

## 2018-10-11 ENCOUNTER — Encounter (HOSPITAL_COMMUNITY): Payer: Self-pay | Admitting: Emergency Medicine

## 2018-10-11 DIAGNOSIS — H5789 Other specified disorders of eye and adnexa: Secondary | ICD-10-CM | POA: Diagnosis not present

## 2018-10-11 DIAGNOSIS — Z79899 Other long term (current) drug therapy: Secondary | ICD-10-CM | POA: Insufficient documentation

## 2018-10-11 DIAGNOSIS — H04301 Unspecified dacryocystitis of right lacrimal passage: Secondary | ICD-10-CM | POA: Diagnosis not present

## 2018-10-11 DIAGNOSIS — I10 Essential (primary) hypertension: Secondary | ICD-10-CM | POA: Insufficient documentation

## 2018-10-11 DIAGNOSIS — H5711 Ocular pain, right eye: Secondary | ICD-10-CM | POA: Diagnosis present

## 2018-10-11 DIAGNOSIS — H04011 Acute dacryoadenitis, right lacrimal gland: Secondary | ICD-10-CM | POA: Diagnosis not present

## 2018-10-11 DIAGNOSIS — E785 Hyperlipidemia, unspecified: Secondary | ICD-10-CM | POA: Insufficient documentation

## 2018-10-11 MED ORDER — TOBRAMYCIN 0.3 % OP SOLN
2.0000 [drp] | Freq: Once | OPHTHALMIC | Status: AC
  Administered 2018-10-11: 2 [drp] via OPHTHALMIC
  Filled 2018-10-11: qty 5

## 2018-10-11 MED ORDER — ACETAMINOPHEN 500 MG PO TABS
1000.0000 mg | ORAL_TABLET | Freq: Once | ORAL | Status: AC
  Administered 2018-10-11: 1000 mg via ORAL
  Filled 2018-10-11: qty 2

## 2018-10-11 MED ORDER — CEPHALEXIN 500 MG PO CAPS
500.0000 mg | ORAL_CAPSULE | Freq: Once | ORAL | Status: AC
  Administered 2018-10-11: 500 mg via ORAL
  Filled 2018-10-11: qty 1

## 2018-10-11 MED ORDER — CEFDINIR 300 MG PO CAPS: 300.0000 mg | ORAL_CAPSULE | Freq: Two times a day (BID) | ORAL | 0 refills | Status: DC

## 2018-10-11 NOTE — Discharge Instructions (Addendum)
Your examination suggest a blocked and infected tear duct involving your right eye.  Please use warm compresses to your eye.  Please use 2 tobramycin eyedrops every 4 hours for the next 5 days.  Please use Omnicef 2 times daily with food.  Please call your eye specialist on Monday or Tuesday and set up an appointment as soon as possible.  Please return to the emergency department if any emergent changes in your condition, problems, or concerns.  The CT scan involving your orbits and your eye was negative for any acute changes or problems.

## 2018-10-11 NOTE — ED Notes (Signed)
Patient transported to CT 

## 2018-10-11 NOTE — ED Triage Notes (Signed)
Pt c/o of pain, drainage and itching in right eye x 4 days

## 2018-10-11 NOTE — ED Provider Notes (Signed)
[facility] EMERGENCY DEPARTMENT Provider Note   CSN: [id number] Arrival date & time: 10/11/18  1750    History   Chief Complaint Chief Complaint  Patient presents with  . Eye Problem    HPI Audrey [name] is a 78 y.o. female.     Patient is a 78 year old female who presents to the emergency department with a complaint of eye pain and drainage.  The patient states that her right eye is been having some redness, swelling, and drainage over the past 2 days.  It started out as itching in the nasal corner of the eye and then begin to swell more and produce pain.  The patient was using warm compresses to the area.  She continued to notice yellow mucousy material from the corner of the eye.  She is also now noticed increased swelling of the lower lid with redness from the nasal corner of the lower lid down to the mid cheek area.  She says this is tender to touch, but not excruciatingly painful.  It is of note that she had an infection in the left eye in April 2020.  She was evaluated by local ophthalmologist as well as the ophthalmology team at the [facility].  She has been placed on valacyclovir and prevision.  The patient states that the redness and swelling and pain seem to be getting progressively worse and she became concerned and wanted to come and get this checked out.  No fever or chills.  No nausea vomiting.  No new vision changes of the right eye.  The patient does not wear contacts.  She has not been around any grinding of wood or metal or any other high velocity impact activity.  The history is provided by the patient.  Eye Problem  Associated symptoms: discharge, itching and redness     Past Medical History:  Diagnosis Date  . Allergic rhinitis   . Anxiety and depression   . Arthritis   . Basal cell carcinoma 2002  . Colon polyp   . Depression   . Diverticulosis   . Dyspnea    with excertion  . Family history of heart disease   . Fibromyalgia   .  Gastric polyps   . GERD (gastroesophageal reflux disease)   . History of nuclear stress test 04/2010   nonischemic   . HLD (hyperlipidemia)   . HTN (hypertension)   . Hypothyroidism   . Meniere's disease   . Obesity   . Pinched vertebral nerve   . PONV (postoperative nausea and vomiting)   . Raynaud's syndrome   . Right hip pain 08/18/2012  . Sleep apnea   . Tuberculosis    postive skin test no symptoms  was around mom that had it    Patient Active Problem List   Diagnosis Date Noted  . Varicose veins of left lower extremity with complications 88/50/2774  . 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 Presbycusis of both ears 03/06/2016  . Adrenal adenoma, 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  . Acquired hypothyroidism 09/24/2014  . Recurrent UTI 09/22/2014  . Cough 05/08/2013  . Erosion of implanted vaginal mesh to surrounding organ or tissue ([place]) 05/05/2013  . Essential hypertension 03/25/2013  . Hyperlipidemia 03/25/2013  . Nocturnal hypoxemia 03/05/2013  . Lumbago 10/16/2012  . Low back pain 08/18/2012  . Right hip pain 08/18/2012  . Overactive bladder 03/21/2012  . S/P balloon dilatation of esophageal stricture 01/29/2012  . Raynaud's disease 01/29/2012  . Meniere disease 01/29/2012  .  Fibromyalgia 01/29/2012  . Fecal incontinence 12/27/2011  . Urge incontinence 12/27/2011  . Atrophic vaginitis 12/27/2011  . Family history of von Willebrand disease 06/26/2011  . Abdominal pain, right upper quadrant 06/26/2011  . GERD 11/29/2009  . DYSPHAGIA 11/29/2009  . COLONIC POLYPS, BENIGN, HX OF 11/29/2009    Past Surgical History:  Procedure Laterality Date  . ABDOMINAL HYSTERECTOMY  1982  . BASAL CELL CARCINOMA EXCISION  03-31-01   face  . BLADDER REPAIR  2005   ovaries, tubes removed; pelvic prolapse corrected; rectal repair  . BLADDER SURGERY  01-2009   with mesh placement  . CATARACT EXTRACTION     bilateral  . COLONOSCOPY W/ BIOPSIES AND POLYPECTOMY  12/20/2009    diverticulosis, adenomatous polyps  . COLONOSCOPY WITH PROPOFOL N/A 08/26/2017   Procedure: COLONOSCOPY WITH PROPOFOL;  Surgeon: Gatha Mayer, MD;  Location: [facility];  Service: Endoscopy;  Laterality: N/A;  . ENDOVENOUS ABLATION SAPHENOUS VEIN W/ LASER Left 08/07/2016   endovenous laser ablation left small saphenous vein by Tinnie Gens MD  . ESOPHAGEAL MANOMETRY N/A 05/25/2013   Procedure: ESOPHAGEAL MANOMETRY (EM);  Surgeon: Gatha Mayer, MD;  Location: [facility];  Service: Endoscopy;  Laterality: N/A;  . ESOPHAGOGASTRODUODENOSCOPY (EGD) WITH ESOPHAGEAL DILATION  06/09/2012   Procedure: ESOPHAGOGASTRODUODENOSCOPY (EGD) WITH ESOPHAGEAL DILATION;  Surgeon: Gatha Mayer, MD;  Location: [facility];  Service: Endoscopy;  Laterality: N/A;  . FOOT SURGERY  1999   bilateral  . HAMMER TOE SURGERY Right 11/13/2017   Procedure: HAMMER TOE REPAIR RIGHT 4TH TOE AND 5TH TOE;  Surgeon: Caprice Beaver, DPM;  Location: [facility];  Service: Podiatry;  Laterality: Right;  . INTERSTIM IMPLANT PLACEMENT  01/2012   [facility]  . laser surgery bladder  05-25-10   on mesh  . LIPOMA EXCISION     right arm  . NASOLACRIMAL DUCT PROBING  09-12-10   right eye  . NASOLACRIMAL DUCT PROBING  09-26-10   left eye  . OSTECTOMY Right 11/13/2017   Procedure: OSTECTOMY RIGHT 5TH TOE;  Surgeon: Caprice Beaver, DPM;  Location: [facility];  Service: Podiatry;  Laterality: Right;  . removal of bladder mesh  03-01-11   [facility]  . TONSILLECTOMY  1959  . TRANSTHORACIC ECHOCARDIOGRAM  12/2006   EF=>55%; LA mild-mod dilated; RA mildly dilated; mild mitral annular calcif, mild MR; mod TR & elevated RVSP; mild pulm vavle regurg  . UPPER GASTROINTESTINAL ENDOSCOPY  12/20/2009   GERD     OB History   No obstetric history on file.      Home Medications    Prior to Admission medications   Medication Sig Start Date End Date Taking? Authorizing Provider  furosemide (LASIX) 40 MG tablet Take 40 mg by mouth daily.    Yes [provider]  gabapentin (NEURONTIN) 600 MG tablet Take 600 mg by mouth 2 (two) times daily.    Yes [provider]  levothyroxine (SYNTHROID, LEVOTHROID) 50 MCG tablet Take 50 mcg by mouth daily before breakfast.    Yes [provider]  potassium chloride SA (K-DUR,KLOR-CON) 20 MEQ tablet Take 40 mEq by mouth 2 (two) times daily.    Yes [provider]  rosuvastatin (CRESTOR) 5 MG tablet Take 5 mg by mouth every Monday, Wednesday, and Friday. In the morning.   Yes [provider]  SAVELLA 100 MG TABS tablet Take 100 mg by mouth 2 (two) times daily. 10/11/17  Yes [provider]  alendronate (FOSAMAX) 70 MG tablet Take 70  mg by mouth every Sunday. Take with a full glass of water on an empty stomach.     [provider]  Calcium Carb-Cholecalciferol (CALCIUM+D3 PO) Take 1 tablet by mouth daily. 1200 mg (Calcium)-25 mcg (D3)    [provider]  chlordiazePOXIDE (LIBRIUM) 10 MG capsule Take 10 mg by mouth 2 (two) times daily.     [provider]  Cholecalciferol (VITAMIN D3) 2000 UNITS TABS Take 4,000 Units by mouth at bedtime.     [provider]  Coenzyme Q10 200 MG capsule Take 200 mg by mouth daily.    [provider]  cyanocobalamin (,VITAMIN B-12,) 1000 MCG/ML injection Inject 1,000 mcg into the muscle every 30 (thirty) days.    [provider]  fluticasone (FLONASE) 50 MCG/ACT nasal spray Place 1 spray into both nostrils daily. In the morning 10/23/17   [provider]  Javier Docker Oil 500 MG CAPS Take 500 mg by mouth daily.    [provider]  losartan-hydrochlorothiazide (HYZAAR) 100-25 MG per tablet Take 0.5 tablets by mouth daily.  02/24/13   [provider]  meloxicam (MOBIC) 15 MG tablet Take 15 mg by mouth daily as needed for pain.    [provider]  OXYGEN Inhale 2 L into the lungs at bedtime.     [provider]  pantoprazole (PROTONIX) 40 MG tablet  TAKE 1 TABLET BY MOUTH DAILY BEFORE BREAKFAST AND TAKE 1 TABLET DAILY BEFORE DINNER 02/20/18   Gatha Mayer, MD  Polyethyl Glycol-Propyl Glycol (SYSTANE OP) Place 1 drop into both eyes 2 (two) times daily.    [provider]  PREMARIN vaginal cream Place 0.5 g vaginally at bedtime as needed. For discomfort/dryness 09/10/17   [provider]  trimethoprim (TRIMPEX) 100 MG tablet Take 100 mg by mouth daily. 09/10/17   [provider]    Family History Family History  Problem Relation Age of Onset  . Pancreatic cancer Mother   . Diabetes Mother   . Tuberculosis Mother   . Prostate cancer Father   . Colon polyps Father   . Diabetes Father   . Hyperlipidemia Father   . Hypertension Father        also kidney disease  . Heart disease Father        CABG, twice  . Diabetes Maternal Grandmother   . Colon cancer Maternal Grandfather   . Clotting disorder Paternal Grandmother        blood disorder  . Heart disease Paternal Grandfather   . Stroke Brother   . Diabetes Brother   . Breast cancer Maternal Aunt        x 4  . Colon cancer Maternal Uncle   . Von Willebrand disease Daughter   . Von Willebrand disease Daughter   . Hypertension Son   . Crohn's disease Grandchild   . Clotting disorder Grandchild        x3    Social History Social History   Tobacco Use  . Smoking status: Never Smoker  . Smokeless tobacco: Never Used  Substance Use Topics  . Alcohol use: No  . Drug use: No     Allergies   Erythromycin; Other; Statins; Sulfa antibiotics; Tramadol; Vytorin [ezetimibe-simvastatin]; and Adhesive [tape]   Review of Systems Review of Systems  Constitutional: Negative for activity change.       All ROS Neg except as noted in HPI  HENT: Negative for nosebleeds.   Eyes: Positive for pain, discharge, redness and itching.  Respiratory: Negative for cough, shortness of breath and wheezing.   Cardiovascular: Negative for chest pain and palpitations.   Gastrointestinal: Negative for abdominal pain and blood in stool.  Genitourinary: Negative for dysuria, frequency and hematuria.  Musculoskeletal: Negative for arthralgias, back pain and neck pain.  Skin: Negative.   Neurological: Negative for dizziness, seizures and speech difficulty.  Psychiatric/Behavioral: Negative for confusion and hallucinations.     Physical Exam Updated Vital Signs BP (!) 171/97   Pulse (!) 106   Temp 98.4 F (36.9 C) (Oral)   Resp 17   Ht 5\' 1"  (1.549 m)   Wt 83.9 kg   SpO2 96%   BMI 34.96 kg/m   Physical Exam Vitals signs and nursing note reviewed.  Constitutional:      Appearance: She is well-developed. She is not toxic-appearing.  HENT:     Head: Normocephalic.      Right Ear: Tympanic membrane and external ear normal.     Left Ear: Tympanic membrane and external ear normal.  Eyes:     General: Lids are normal.     Extraocular Movements: Extraocular movements intact.     Conjunctiva/sclera:     Right eye: Right conjunctiva is injected.     Pupils: Pupils are equal, round, and reactive to light.      Comments: Anterior chamber is clear. No hypopyon noted.  Neck:     Musculoskeletal: Normal range of motion and neck supple.     Vascular: No carotid bruit.  Cardiovascular:     Rate and Rhythm: Normal rate and regular rhythm.     Pulses: Normal pulses.     Heart sounds: Normal heart sounds.  Pulmonary:     Effort: No respiratory distress.     Breath sounds: Normal breath sounds.  Abdominal:     General: Bowel sounds are normal.     Palpations: Abdomen is soft.     Tenderness: There is no abdominal tenderness. There is no guarding.  Musculoskeletal: Normal range of motion.  Lymphadenopathy:     Head:     Right side of head: No submandibular adenopathy.     Left side of head: No submandibular adenopathy.     Cervical: No cervical adenopathy.  Skin:    General: Skin is warm and dry.  Neurological:     Mental Status: She is alert and  oriented to person, place, and time.     Cranial Nerves: No cranial nerve deficit.     Sensory: No sensory deficit.  Psychiatric:        Speech: Speech normal.      ED Treatments / Results  Labs (all labs ordered are listed, but only abnormal results are displayed) Labs Reviewed - No data to display  EKG None  Radiology No results found.  Procedures Procedures (including critical care time)  Medications Ordered in ED Medications - No data to display   Initial Impression / Assessment and Plan / ED Course  I have reviewed the triage vital signs and the nursing notes.  Pertinent labs & imaging results that were available during my care of the patient were reviewed by me and considered in my medical decision making (see chart for details).          Final Clinical Impressions(s) / ED Diagnoses MDM  Heart rate is elevated at 106, blood pressure is elevated at 171/97, otherwise vital signs are within normal limits.  Pulse oximetry is 96% on room air.  Within normal limits by my  interpretation. Case reviewed and discussed with Dr. Neena Rhymes  The patient has increased redness and swelling of 1 of the ducts on the corner of the right eye with swelling of the lower lid, and a red streak from the nasal corner to the mid cheek.  Will obtain CT scan of the orbits to evaluate the soft tissue as well as the globes.  CT scan reveals no acute abnormalities. Exam consistent with occluded duct with infection. Pt started on tobramycin and omnicef  PO. Pt to follow up with ophthalmology ASAP.   Final diagnoses:  Tear duct infection, right    ED Discharge Orders         Ordered    cefdinir (OMNICEF) 300 MG capsule  2 times daily     10/11/18 1955           Lily Kocher, PA-C 10/12/18 [place], [place], DO 10/13/18 1620

## 2018-10-11 NOTE — ED Notes (Signed)
Cold water compress placed on pts eye  Pt transported to CT

## 2018-10-14 DIAGNOSIS — H43813 Vitreous degeneration, bilateral: Secondary | ICD-10-CM | POA: Diagnosis not present

## 2018-10-14 DIAGNOSIS — H209 Unspecified iridocyclitis: Secondary | ICD-10-CM | POA: Diagnosis not present

## 2018-10-14 DIAGNOSIS — Z961 Presence of intraocular lens: Secondary | ICD-10-CM | POA: Diagnosis not present

## 2018-10-14 DIAGNOSIS — H353132 Nonexudative age-related macular degeneration, bilateral, intermediate dry stage: Secondary | ICD-10-CM | POA: Diagnosis not present

## 2018-10-14 DIAGNOSIS — H35033 Hypertensive retinopathy, bilateral: Secondary | ICD-10-CM | POA: Diagnosis not present

## 2018-10-14 DIAGNOSIS — H00032 Abscess of right lower eyelid: Secondary | ICD-10-CM | POA: Diagnosis not present

## 2018-10-17 DIAGNOSIS — D519 Vitamin B12 deficiency anemia, unspecified: Secondary | ICD-10-CM | POA: Diagnosis not present

## 2018-10-21 DIAGNOSIS — Z961 Presence of intraocular lens: Secondary | ICD-10-CM | POA: Diagnosis not present

## 2018-10-21 DIAGNOSIS — H353132 Nonexudative age-related macular degeneration, bilateral, intermediate dry stage: Secondary | ICD-10-CM | POA: Diagnosis not present

## 2018-10-21 DIAGNOSIS — H43813 Vitreous degeneration, bilateral: Secondary | ICD-10-CM | POA: Diagnosis not present

## 2018-10-21 DIAGNOSIS — H209 Unspecified iridocyclitis: Secondary | ICD-10-CM | POA: Diagnosis not present

## 2018-10-21 DIAGNOSIS — H00032 Abscess of right lower eyelid: Secondary | ICD-10-CM | POA: Diagnosis not present

## 2018-10-21 DIAGNOSIS — H35033 Hypertensive retinopathy, bilateral: Secondary | ICD-10-CM | POA: Diagnosis not present

## 2018-11-05 DIAGNOSIS — N390 Urinary tract infection, site not specified: Secondary | ICD-10-CM | POA: Diagnosis not present

## 2018-11-20 DIAGNOSIS — H0102A Squamous blepharitis right eye, upper and lower eyelids: Secondary | ICD-10-CM | POA: Diagnosis not present

## 2018-11-20 DIAGNOSIS — H40013 Open angle with borderline findings, low risk, bilateral: Secondary | ICD-10-CM | POA: Diagnosis not present

## 2018-11-20 DIAGNOSIS — Z961 Presence of intraocular lens: Secondary | ICD-10-CM | POA: Diagnosis not present

## 2018-11-20 DIAGNOSIS — H353131 Nonexudative age-related macular degeneration, bilateral, early dry stage: Secondary | ICD-10-CM | POA: Diagnosis not present

## 2018-11-20 DIAGNOSIS — H02832 Dermatochalasis of right lower eyelid: Secondary | ICD-10-CM | POA: Diagnosis not present

## 2018-11-20 DIAGNOSIS — H02835 Dermatochalasis of left lower eyelid: Secondary | ICD-10-CM | POA: Diagnosis not present

## 2018-11-20 DIAGNOSIS — H0102B Squamous blepharitis left eye, upper and lower eyelids: Secondary | ICD-10-CM | POA: Diagnosis not present

## 2018-11-20 DIAGNOSIS — H04123 Dry eye syndrome of bilateral lacrimal glands: Secondary | ICD-10-CM | POA: Diagnosis not present

## 2018-12-10 ENCOUNTER — Other Ambulatory Visit: Payer: Self-pay

## 2018-12-10 ENCOUNTER — Ambulatory Visit (INDEPENDENT_AMBULATORY_CARE_PROVIDER_SITE_OTHER): Payer: Medicare Other | Admitting: Acute Care

## 2018-12-10 ENCOUNTER — Encounter: Payer: Self-pay | Admitting: Acute Care

## 2018-12-10 DIAGNOSIS — G4734 Idiopathic sleep related nonobstructive alveolar hypoventilation: Secondary | ICD-10-CM | POA: Diagnosis not present

## 2018-12-10 DIAGNOSIS — G4733 Obstructive sleep apnea (adult) (pediatric): Secondary | ICD-10-CM | POA: Diagnosis not present

## 2018-12-10 DIAGNOSIS — Z90710 Acquired absence of both cervix and uterus: Secondary | ICD-10-CM | POA: Diagnosis not present

## 2018-12-10 DIAGNOSIS — R635 Abnormal weight gain: Secondary | ICD-10-CM | POA: Diagnosis not present

## 2018-12-10 DIAGNOSIS — M8589 Other specified disorders of bone density and structure, multiple sites: Secondary | ICD-10-CM | POA: Diagnosis not present

## 2018-12-10 NOTE — Assessment & Plan Note (Signed)
Compliant with therapy Benefiting from treatment Plan: Continue wearing you oxygen at 2 L [place] at bedtime. You are benefiting from therapy. We will send a copy of this OV note  to your insurance company. Follow up in 1 year or before as needed.  Continue wearing your mask at all times when you are away from home and less than 6 feet away from others.  Please contact office for sooner follow up if symptoms do not improve or worsen or seek emergency care

## 2018-12-10 NOTE — Patient Instructions (Addendum)
It is good to see you today.  Continue wearing you oxygen at 2 L [place] at bedtime. You are benefiting from therapy. We will send a copy of this OV note  to your insurance company. Follow up in 1 year or before as needed.  Continue wearing your mask at all times when you are away from home and less than 6 feet away from others.  Please contact office for sooner follow up if symptoms do not improve or worsen or seek emergency care

## 2018-12-10 NOTE — Assessment & Plan Note (Addendum)
Mild AHI was 6.4 Not on CPAP therapy Plan Sleep on your side Work on weight loss If your daytime sleepiness or quality of sleep worsens, we can consider CPAP therapy.

## 2018-12-10 NOTE — Progress Notes (Signed)
History of Present Illness Audrey [name] is a 78 y.o. female with mild OSA and nocturnal hypoxemia. She was seen once in the office by Dr. Elsworth Soho.    12/10/2018 OV for oxygen qualification. Pt wears oxygen at bedtime for nocturnal hypoxemia. She states she is compliant with her oxygen therapy every night. She is here per her DME to re qualify for her nocturnal oxygen. Per her insurance and DME requirements, the patient has been told she must have reassessment for continued therapy.She states she is doing well. She has no issues with Her night time oxygen. She states she does have some early morning sleepiness but it clears as soon as she wakes up. She states if she falls asleep without her oxygen she wakes up and her body feels heavy.She is wearing her compression stockings every day for lower extremity edema, and she is compliant with her daily lasix. .She denies fever, chest pain, orthopnea or hemoptysis. She does not need any oxygen supplies at present.   Test Results: Sleep Study 12/2017 AHI was 6.4 Nocturnal desaturations 40 desaturations were recorded. Nadir was 81 % Diagnosis is very mild OSA with hypopneas Mild oxygen desaturations noted without respiratory events. Recommendations Dr. Elsworth Soho: Treatment options for this degree of sleep disordered breathing incluse positional therapy and weight loss alone. CPAP can be considered if the patient is very symptomatic. Consider evaluation of underlying cardiopulmonary disease as cause for oxygen desaturations.   CBC Latest Ref Rng & Units 11/08/2017 02/16/2009 02/11/2009  WBC 4.0 - 10.5 K/uL 6.4 9.6 6.6  Hemoglobin 12.0 - 15.0 g/dL 12.0 10.3(L) 13.6  Hematocrit 36.0 - 46.0 % 38.8 30.2(L) 40.3  Platelets 150 - 400 K/uL 236 189 252    BMP Latest Ref Rng & Units 11/08/2017 02/11/2009 04/16/2006  Glucose 65 - 99 mg/dL 87 71 -  BUN 6 - 20 mg/dL 25(H) 10 -  Creatinine 0.44 - 1.00 mg/dL 0.79 0.77 -  Sodium 135 - 145 mmol/L 140 142 -  Potassium 3.5 -  5.1 mmol/L 4.0 3.4(L) 3.2(L)  Chloride 101 - 111 mmol/L 107 102 -  CO2 22 - 32 mmol/L 26 36(H) -  Calcium 8.9 - 10.3 mg/dL 9.2 9.5 -    BNP No results found for: BNP  ProBNP No results found for: PROBNP  PFT No results found for: FEV1PRE, FEV1POST, FVCPRE, FVCPOST, TLC, DLCOUNC, PREFEV1FVCRT, PSTFEV1FVCRT  No results found.   Past medical hx Past Medical History:  Diagnosis Date  . Allergic rhinitis   . Anxiety and depression   . Arthritis   . Basal cell carcinoma 2002  . Colon polyp   . Depression   . Diverticulosis   . Dyspnea    with excertion  . Family history of heart disease   . Fibromyalgia   . Gastric polyps   . GERD (gastroesophageal reflux disease)   . History of nuclear stress test 04/2010   nonischemic   . HLD (hyperlipidemia)   . HTN (hypertension)   . Hypothyroidism   . Meniere's disease   . Obesity   . Pinched vertebral nerve   . PONV (postoperative nausea and vomiting)   . Raynaud's syndrome   . Right hip pain 08/18/2012  . Sleep apnea   . Tuberculosis    postive skin test no symptoms  was around mom that had it     Social History   Tobacco Use  . Smoking status: Never Smoker  . Smokeless tobacco: Never Used  Substance Use Topics  .  Alcohol use: No  . Drug use: No    Audrey [name] reports that she has never smoked. She has never used smokeless tobacco. She reports that she does not drink alcohol or use drugs.  Tobacco Cessation: Never smoker  Past surgical hx, Family hx, Social hx all reviewed.  Current Outpatient Medications on File Prior to Visit  Medication Sig  . alendronate (FOSAMAX) 70 MG tablet Take 70 mg by mouth every Sunday. Take with a full glass of water on an empty stomach.   . Calcium Carb-Cholecalciferol (CALCIUM+D3 PO) Take 1 tablet by mouth daily. 1200 mg (Calcium)-25 mcg (D3)  . cefdinir (OMNICEF) 300 MG capsule Take 1 capsule (300 mg total) by mouth 2 (two) times daily.  . chlordiazePOXIDE (LIBRIUM) 10 MG capsule Take  10 mg by mouth 2 (two) times daily.   . Cholecalciferol (VITAMIN D3) 2000 UNITS TABS Take 4,000 Units by mouth at bedtime.   . Coenzyme Q10 200 MG capsule Take 200 mg by mouth daily.  . cyanocobalamin (,VITAMIN B-12,) 1000 MCG/ML injection Inject 1,000 mcg into the muscle every 30 (thirty) days.  . fluticasone (FLONASE) 50 MCG/ACT nasal spray Place 1 spray into both nostrils daily. In the morning  . furosemide (LASIX) 40 MG tablet Take 40 mg by mouth daily.   Marland Kitchen gabapentin (NEURONTIN) 600 MG tablet Take 600 mg by mouth 2 (two) times daily.   Javier Docker Oil 500 MG CAPS Take 500 mg by mouth daily.  Marland Kitchen levothyroxine (SYNTHROID, LEVOTHROID) 50 MCG tablet Take 50 mcg by mouth daily before breakfast.   . losartan-hydrochlorothiazide (HYZAAR) 100-25 MG per tablet Take 0.5 tablets by mouth daily.   . meloxicam (MOBIC) 15 MG tablet Take 15 mg by mouth daily as needed for pain.  . OXYGEN Inhale 2 L into the lungs at bedtime.   . pantoprazole (PROTONIX) 40 MG tablet TAKE 1 TABLET BY MOUTH DAILY BEFORE BREAKFAST AND TAKE 1 TABLET DAILY BEFORE DINNER  . Polyethyl Glycol-Propyl Glycol (SYSTANE OP) Place 1 drop into both eyes 2 (two) times daily.  . potassium chloride SA (K-DUR,KLOR-CON) 20 MEQ tablet Take 40 mEq by mouth 2 (two) times daily.   Marland Kitchen PREMARIN vaginal cream Place 0.5 g vaginally at bedtime as needed. For discomfort/dryness  . rosuvastatin (CRESTOR) 5 MG tablet Take 5 mg by mouth every Monday, Wednesday, and Friday. In the morning.  Marland Kitchen SAVELLA 100 MG TABS tablet Take 100 mg by mouth 2 (two) times daily.  Marland Kitchen trimethoprim (TRIMPEX) 100 MG tablet Take 100 mg by mouth daily.  . valACYclovir (VALTREX) 1000 MG tablet    No current facility-administered medications on file prior to visit.      Allergies  Allergen Reactions  . Erythromycin Diarrhea  . Other Hives and Other (See Comments)    Allergen - EKG electrodes  . Statins Other (See Comments)    Makes her hurt bad  . Sulfa Antibiotics Hives, Nausea  Only and Rash  . Tramadol Nausea And Vomiting and Other (See Comments)    medicine has her dizzy and nauseated   . Vytorin [Ezetimibe-Simvastatin] Hives  . Adhesive [Tape] Rash    Review Of Systems:  Constitutional:   No  weight loss, night sweats,  Fevers, chills, fatigue, or  lassitude.  HEENT:   No headaches,  Difficulty swallowing,  Tooth/dental problems, or  Sore throat,                No sneezing, itching, ear ache, nasal congestion, post nasal drip,  CV:  No chest pain,  Orthopnea, PND, + swelling in lower extremities, No anasarca, dizziness, palpitations, syncope.   GI  No heartburn, indigestion, abdominal pain, nausea, vomiting, diarrhea, change in bowel habits, loss of appetite, bloody stools.   Resp: No shortness of breath with exertion or at rest.  No excess mucus, no productive cough,  No non-productive cough,  No coughing up of blood.  No change in color of mucus.  No wheezing.  No chest wall deformity  Skin: no rash or lesions.  GU: no dysuria, change in color of urine, + urgency or frequency.  No flank pain, no hematuria   MS:  No joint pain or swelling.  No decreased range of motion.  No back pain.  Psych:  No change in mood or affect. No depression or anxiety.  No memory loss.   Vital Signs BP 138/78 (BP Location: Left Arm, Cuff Size: Large)   Pulse 100   Temp 97.8 F (36.6 C) (Oral)   Ht 5' (1.524 m)   Wt 186 lb 12.8 oz (84.7 kg)   SpO2 99%   BMI 36.48 kg/m    Physical Exam:  General- No distress,  A&Ox3, pleasant ENT: No sinus tenderness, TM clear, pale nasal mucosa, no oral exudate,no post nasal drip, no LAN Cardiac: S1, S2, regular rate and rhythm, no murmur Chest: No wheeze/ rales/ dullness; no accessory muscle use, no nasal flaring, no sternal retractions, diminished per bases Abd.: Soft Non-tender, ND, BS + Ext: No clubbing cyanosis, 1+ BLE edema, compression hose in  place Neuro:  normal strength, MAE x 4, A&O x 3, appropriate Skin: No  rashes, no lesions, warm and dry Psych: normal mood and behavior   Assessment/Plan  Nocturnal hypoxemia Compliant with therapy Benefiting from treatment Plan: Continue wearing you oxygen at 2 L [place] at bedtime. You are benefiting from therapy. We will send a copy of this OV note  to your insurance company. Follow up in 1 year or before as needed.  Continue wearing your mask at all times when you are away from home and less than 6 feet away from others.  Please contact office for sooner follow up if symptoms do not improve or worsen or seek emergency care     OSA (obstructive sleep apnea) Mild AHI was 6.4 Not on CPAP therapy Plan Sleep on your side Work on weight loss If your daytime sleepiness or quality of sleep worsens, we can consider CPAP therapy.     Magdalen Spatz, NP 12/10/2018  2:26 PM

## 2018-12-15 DIAGNOSIS — D519 Vitamin B12 deficiency anemia, unspecified: Secondary | ICD-10-CM | POA: Diagnosis not present

## 2018-12-19 DIAGNOSIS — H35033 Hypertensive retinopathy, bilateral: Secondary | ICD-10-CM | POA: Diagnosis not present

## 2018-12-19 DIAGNOSIS — Z961 Presence of intraocular lens: Secondary | ICD-10-CM | POA: Diagnosis not present

## 2018-12-19 DIAGNOSIS — H353132 Nonexudative age-related macular degeneration, bilateral, intermediate dry stage: Secondary | ICD-10-CM | POA: Diagnosis not present

## 2018-12-19 DIAGNOSIS — H209 Unspecified iridocyclitis: Secondary | ICD-10-CM | POA: Diagnosis not present

## 2018-12-19 DIAGNOSIS — H43813 Vitreous degeneration, bilateral: Secondary | ICD-10-CM | POA: Diagnosis not present

## 2018-12-19 DIAGNOSIS — H00032 Abscess of right lower eyelid: Secondary | ICD-10-CM | POA: Diagnosis not present

## 2018-12-29 DIAGNOSIS — N342 Other urethritis: Secondary | ICD-10-CM | POA: Diagnosis not present

## 2018-12-29 DIAGNOSIS — L03818 Cellulitis of other sites: Secondary | ICD-10-CM | POA: Diagnosis not present

## 2018-12-29 DIAGNOSIS — Z6835 Body mass index (BMI) 35.0-35.9, adult: Secondary | ICD-10-CM | POA: Diagnosis not present

## 2018-12-29 DIAGNOSIS — E6609 Other obesity due to excess calories: Secondary | ICD-10-CM | POA: Diagnosis not present

## 2018-12-29 DIAGNOSIS — N390 Urinary tract infection, site not specified: Secondary | ICD-10-CM | POA: Diagnosis not present

## 2018-12-30 DIAGNOSIS — M4150 Other secondary scoliosis, site unspecified: Secondary | ICD-10-CM | POA: Diagnosis not present

## 2018-12-30 DIAGNOSIS — M545 Low back pain: Secondary | ICD-10-CM | POA: Diagnosis not present

## 2019-01-13 DIAGNOSIS — D519 Vitamin B12 deficiency anemia, unspecified: Secondary | ICD-10-CM | POA: Diagnosis not present

## 2019-01-13 DIAGNOSIS — M1991 Primary osteoarthritis, unspecified site: Secondary | ICD-10-CM | POA: Diagnosis not present

## 2019-01-13 DIAGNOSIS — E6609 Other obesity due to excess calories: Secondary | ICD-10-CM | POA: Diagnosis not present

## 2019-01-13 DIAGNOSIS — N3020 Other chronic cystitis without hematuria: Secondary | ICD-10-CM | POA: Diagnosis not present

## 2019-01-13 DIAGNOSIS — Z6834 Body mass index (BMI) 34.0-34.9, adult: Secondary | ICD-10-CM | POA: Diagnosis not present

## 2019-01-16 DIAGNOSIS — M2041 Other hammer toe(s) (acquired), right foot: Secondary | ICD-10-CM | POA: Diagnosis not present

## 2019-01-16 DIAGNOSIS — Q828 Other specified congenital malformations of skin: Secondary | ICD-10-CM | POA: Diagnosis not present

## 2019-01-28 DIAGNOSIS — M5416 Radiculopathy, lumbar region: Secondary | ICD-10-CM | POA: Diagnosis not present

## 2019-01-28 DIAGNOSIS — M4150 Other secondary scoliosis, site unspecified: Secondary | ICD-10-CM | POA: Diagnosis not present

## 2019-01-30 DIAGNOSIS — M2041 Other hammer toe(s) (acquired), right foot: Secondary | ICD-10-CM | POA: Diagnosis not present

## 2019-02-02 DIAGNOSIS — M5416 Radiculopathy, lumbar region: Secondary | ICD-10-CM | POA: Diagnosis not present

## 2019-02-06 ENCOUNTER — Ambulatory Visit (HOSPITAL_COMMUNITY)
Admission: RE | Admit: 2019-02-06 | Discharge: 2019-02-06 | Disposition: A | Discharge status: Home / Self Care | Payer: Medicare Other | Source: Ambulatory Visit | Attending: Podiatry | Admitting: Podiatry

## 2019-02-06 ENCOUNTER — Other Ambulatory Visit: Payer: Self-pay

## 2019-02-06 ENCOUNTER — Other Ambulatory Visit: Payer: Self-pay | Admitting: Podiatry

## 2019-02-06 ENCOUNTER — Other Ambulatory Visit (HOSPITAL_COMMUNITY): Payer: Self-pay | Admitting: Podiatry

## 2019-02-06 DIAGNOSIS — M79662 Pain in left lower leg: Secondary | ICD-10-CM

## 2019-02-06 DIAGNOSIS — M7989 Other specified soft tissue disorders: Secondary | ICD-10-CM | POA: Insufficient documentation

## 2019-02-06 DIAGNOSIS — IMO0001 Reserved for inherently not codable concepts without codable children: Secondary | ICD-10-CM

## 2019-02-06 DIAGNOSIS — R600 Localized edema: Secondary | ICD-10-CM | POA: Diagnosis not present

## 2019-02-06 DIAGNOSIS — I82502 Chronic embolism and thrombosis of unspecified deep veins of left lower extremity: Secondary | ICD-10-CM | POA: Diagnosis not present

## 2019-02-17 DIAGNOSIS — Z23 Encounter for immunization: Secondary | ICD-10-CM | POA: Diagnosis not present

## 2019-02-17 DIAGNOSIS — D519 Vitamin B12 deficiency anemia, unspecified: Secondary | ICD-10-CM | POA: Diagnosis not present

## 2019-03-02 DIAGNOSIS — I10 Essential (primary) hypertension: Secondary | ICD-10-CM | POA: Diagnosis not present

## 2019-03-02 DIAGNOSIS — M48061 Spinal stenosis, lumbar region without neurogenic claudication: Secondary | ICD-10-CM | POA: Diagnosis not present

## 2019-03-02 DIAGNOSIS — Z6835 Body mass index (BMI) 35.0-35.9, adult: Secondary | ICD-10-CM | POA: Diagnosis not present

## 2019-03-02 DIAGNOSIS — M47816 Spondylosis without myelopathy or radiculopathy, lumbar region: Secondary | ICD-10-CM | POA: Diagnosis not present

## 2019-03-16 DIAGNOSIS — G4730 Sleep apnea, unspecified: Secondary | ICD-10-CM | POA: Diagnosis not present

## 2019-03-16 DIAGNOSIS — Z0000 Encounter for general adult medical examination without abnormal findings: Secondary | ICD-10-CM | POA: Diagnosis not present

## 2019-03-16 DIAGNOSIS — E039 Hypothyroidism, unspecified: Secondary | ICD-10-CM | POA: Diagnosis not present

## 2019-03-19 DIAGNOSIS — M47816 Spondylosis without myelopathy or radiculopathy, lumbar region: Secondary | ICD-10-CM | POA: Diagnosis not present

## 2019-03-24 DIAGNOSIS — D519 Vitamin B12 deficiency anemia, unspecified: Secondary | ICD-10-CM | POA: Diagnosis not present

## 2019-03-30 DIAGNOSIS — I10 Essential (primary) hypertension: Secondary | ICD-10-CM | POA: Diagnosis not present

## 2019-03-30 DIAGNOSIS — Z6825 Body mass index (BMI) 25.0-25.9, adult: Secondary | ICD-10-CM | POA: Diagnosis not present

## 2019-03-30 DIAGNOSIS — M48061 Spinal stenosis, lumbar region without neurogenic claudication: Secondary | ICD-10-CM | POA: Diagnosis not present

## 2019-04-02 DIAGNOSIS — M47816 Spondylosis without myelopathy or radiculopathy, lumbar region: Secondary | ICD-10-CM | POA: Diagnosis not present

## 2019-04-02 DIAGNOSIS — I10 Essential (primary) hypertension: Secondary | ICD-10-CM | POA: Diagnosis not present

## 2019-04-02 DIAGNOSIS — Z6835 Body mass index (BMI) 35.0-35.9, adult: Secondary | ICD-10-CM | POA: Diagnosis not present

## 2019-04-02 DIAGNOSIS — M48061 Spinal stenosis, lumbar region without neurogenic claudication: Secondary | ICD-10-CM | POA: Diagnosis not present

## 2019-04-20 DIAGNOSIS — E7849 Other hyperlipidemia: Secondary | ICD-10-CM | POA: Diagnosis not present

## 2019-04-20 DIAGNOSIS — I10 Essential (primary) hypertension: Secondary | ICD-10-CM | POA: Diagnosis not present

## 2019-04-27 DIAGNOSIS — D519 Vitamin B12 deficiency anemia, unspecified: Secondary | ICD-10-CM | POA: Diagnosis not present

## 2019-05-19 DIAGNOSIS — E785 Hyperlipidemia, unspecified: Secondary | ICD-10-CM | POA: Diagnosis not present

## 2019-05-21 DIAGNOSIS — G894 Chronic pain syndrome: Secondary | ICD-10-CM | POA: Diagnosis not present

## 2019-05-21 DIAGNOSIS — E7849 Other hyperlipidemia: Secondary | ICD-10-CM | POA: Diagnosis not present

## 2019-05-21 DIAGNOSIS — F4542 Pain disorder with related psychological factors: Secondary | ICD-10-CM | POA: Diagnosis not present

## 2019-06-03 ENCOUNTER — Ambulatory Visit (HOSPITAL_COMMUNITY)
Admission: RE | Admit: 2019-06-03 | Discharge: 2019-06-03 | Disposition: A | Discharge status: Home / Self Care | Payer: Medicare Other | Source: Ambulatory Visit | Attending: Physician Assistant | Admitting: Physician Assistant

## 2019-06-03 ENCOUNTER — Other Ambulatory Visit: Payer: Self-pay

## 2019-06-03 ENCOUNTER — Other Ambulatory Visit (HOSPITAL_COMMUNITY): Payer: Self-pay | Admitting: Physician Assistant

## 2019-06-03 DIAGNOSIS — R0602 Shortness of breath: Secondary | ICD-10-CM | POA: Insufficient documentation

## 2019-06-03 DIAGNOSIS — Z6834 Body mass index (BMI) 34.0-34.9, adult: Secondary | ICD-10-CM | POA: Diagnosis not present

## 2019-06-03 DIAGNOSIS — E6609 Other obesity due to excess calories: Secondary | ICD-10-CM | POA: Diagnosis not present

## 2019-06-03 DIAGNOSIS — J22 Unspecified acute lower respiratory infection: Secondary | ICD-10-CM | POA: Diagnosis not present

## 2019-06-03 DIAGNOSIS — R0902 Hypoxemia: Secondary | ICD-10-CM | POA: Diagnosis not present

## 2019-06-05 DIAGNOSIS — M5416 Radiculopathy, lumbar region: Secondary | ICD-10-CM | POA: Diagnosis not present

## 2019-06-05 DIAGNOSIS — M48061 Spinal stenosis, lumbar region without neurogenic claudication: Secondary | ICD-10-CM | POA: Diagnosis not present

## 2019-06-16 DIAGNOSIS — N390 Urinary tract infection, site not specified: Secondary | ICD-10-CM | POA: Diagnosis not present

## 2019-06-16 DIAGNOSIS — N393 Stress incontinence (female) (male): Secondary | ICD-10-CM | POA: Diagnosis not present

## 2019-06-16 DIAGNOSIS — Z462 Encounter for fitting and adjustment of other devices related to nervous system and special senses: Secondary | ICD-10-CM | POA: Diagnosis not present

## 2019-06-16 DIAGNOSIS — N3281 Overactive bladder: Secondary | ICD-10-CM | POA: Diagnosis not present

## 2019-06-21 DIAGNOSIS — I10 Essential (primary) hypertension: Secondary | ICD-10-CM | POA: Diagnosis not present

## 2019-06-21 DIAGNOSIS — E063 Autoimmune thyroiditis: Secondary | ICD-10-CM | POA: Diagnosis not present

## 2019-06-21 DIAGNOSIS — E7849 Other hyperlipidemia: Secondary | ICD-10-CM | POA: Diagnosis not present

## 2019-06-23 DIAGNOSIS — H209 Unspecified iridocyclitis: Secondary | ICD-10-CM | POA: Diagnosis not present

## 2019-06-23 DIAGNOSIS — Z961 Presence of intraocular lens: Secondary | ICD-10-CM | POA: Diagnosis not present

## 2019-06-23 DIAGNOSIS — H00032 Abscess of right lower eyelid: Secondary | ICD-10-CM | POA: Diagnosis not present

## 2019-06-23 DIAGNOSIS — H43813 Vitreous degeneration, bilateral: Secondary | ICD-10-CM | POA: Diagnosis not present

## 2019-06-23 DIAGNOSIS — H35033 Hypertensive retinopathy, bilateral: Secondary | ICD-10-CM | POA: Diagnosis not present

## 2019-06-23 DIAGNOSIS — H353132 Nonexudative age-related macular degeneration, bilateral, intermediate dry stage: Secondary | ICD-10-CM | POA: Diagnosis not present

## 2019-06-25 DIAGNOSIS — D519 Vitamin B12 deficiency anemia, unspecified: Secondary | ICD-10-CM | POA: Diagnosis not present

## 2019-06-26 DIAGNOSIS — G4734 Idiopathic sleep related nonobstructive alveolar hypoventilation: Secondary | ICD-10-CM | POA: Diagnosis not present

## 2019-06-26 DIAGNOSIS — M797 Fibromyalgia: Secondary | ICD-10-CM | POA: Diagnosis not present

## 2019-06-26 DIAGNOSIS — M791 Myalgia, unspecified site: Secondary | ICD-10-CM | POA: Diagnosis not present

## 2019-06-26 DIAGNOSIS — H8109 Meniere's disease, unspecified ear: Secondary | ICD-10-CM | POA: Diagnosis not present

## 2019-06-26 DIAGNOSIS — R151 Fecal smearing: Secondary | ICD-10-CM | POA: Diagnosis not present

## 2019-06-26 DIAGNOSIS — E039 Hypothyroidism, unspecified: Secondary | ICD-10-CM | POA: Diagnosis not present

## 2019-06-26 DIAGNOSIS — N3281 Overactive bladder: Secondary | ICD-10-CM | POA: Diagnosis not present

## 2019-06-26 DIAGNOSIS — Z20822 Contact with and (suspected) exposure to covid-19: Secondary | ICD-10-CM | POA: Diagnosis not present

## 2019-06-26 DIAGNOSIS — D3501 Benign neoplasm of right adrenal gland: Secondary | ICD-10-CM | POA: Diagnosis not present

## 2019-06-26 DIAGNOSIS — Z9889 Other specified postprocedural states: Secondary | ICD-10-CM | POA: Diagnosis not present

## 2019-06-26 DIAGNOSIS — G4733 Obstructive sleep apnea (adult) (pediatric): Secondary | ICD-10-CM | POA: Diagnosis not present

## 2019-06-26 DIAGNOSIS — K219 Gastro-esophageal reflux disease without esophagitis: Secondary | ICD-10-CM | POA: Diagnosis not present

## 2019-06-26 DIAGNOSIS — I7300 Raynaud's syndrome without gangrene: Secondary | ICD-10-CM | POA: Diagnosis not present

## 2019-06-26 DIAGNOSIS — I10 Essential (primary) hypertension: Secondary | ICD-10-CM | POA: Diagnosis not present

## 2019-06-29 DIAGNOSIS — M797 Fibromyalgia: Secondary | ICD-10-CM | POA: Diagnosis not present

## 2019-06-29 DIAGNOSIS — G8929 Other chronic pain: Secondary | ICD-10-CM | POA: Diagnosis not present

## 2019-06-29 DIAGNOSIS — M5127 Other intervertebral disc displacement, lumbosacral region: Secondary | ICD-10-CM | POA: Diagnosis not present

## 2019-06-29 DIAGNOSIS — N390 Urinary tract infection, site not specified: Secondary | ICD-10-CM | POA: Diagnosis not present

## 2019-06-29 DIAGNOSIS — N393 Stress incontinence (female) (male): Secondary | ICD-10-CM | POA: Diagnosis not present

## 2019-06-29 DIAGNOSIS — N3946 Mixed incontinence: Secondary | ICD-10-CM | POA: Diagnosis not present

## 2019-06-29 DIAGNOSIS — Z462 Encounter for fitting and adjustment of other devices related to nervous system and special senses: Secondary | ICD-10-CM | POA: Diagnosis not present

## 2019-06-29 DIAGNOSIS — T85191A Other mechanical complication of implanted electronic neurostimulator (electrode) of peripheral nerve, initial encounter: Secondary | ICD-10-CM | POA: Diagnosis not present

## 2019-06-29 DIAGNOSIS — N3281 Overactive bladder: Secondary | ICD-10-CM | POA: Diagnosis not present

## 2019-06-29 DIAGNOSIS — Z79899 Other long term (current) drug therapy: Secondary | ICD-10-CM | POA: Diagnosis not present

## 2019-06-29 DIAGNOSIS — K219 Gastro-esophageal reflux disease without esophagitis: Secondary | ICD-10-CM | POA: Diagnosis not present

## 2019-06-29 DIAGNOSIS — E039 Hypothyroidism, unspecified: Secondary | ICD-10-CM | POA: Diagnosis not present

## 2019-06-29 DIAGNOSIS — G4730 Sleep apnea, unspecified: Secondary | ICD-10-CM | POA: Diagnosis not present

## 2019-06-29 DIAGNOSIS — I7300 Raynaud's syndrome without gangrene: Secondary | ICD-10-CM | POA: Diagnosis not present

## 2019-06-29 DIAGNOSIS — I10 Essential (primary) hypertension: Secondary | ICD-10-CM | POA: Diagnosis not present

## 2019-07-07 DIAGNOSIS — L570 Actinic keratosis: Secondary | ICD-10-CM | POA: Diagnosis not present

## 2019-07-07 DIAGNOSIS — L905 Scar conditions and fibrosis of skin: Secondary | ICD-10-CM | POA: Diagnosis not present

## 2019-07-07 DIAGNOSIS — C44329 Squamous cell carcinoma of skin of other parts of face: Secondary | ICD-10-CM | POA: Diagnosis not present

## 2019-07-07 DIAGNOSIS — C44319 Basal cell carcinoma of skin of other parts of face: Secondary | ICD-10-CM | POA: Diagnosis not present

## 2019-07-07 DIAGNOSIS — L578 Other skin changes due to chronic exposure to nonionizing radiation: Secondary | ICD-10-CM | POA: Diagnosis not present

## 2019-07-07 DIAGNOSIS — L820 Inflamed seborrheic keratosis: Secondary | ICD-10-CM | POA: Diagnosis not present

## 2019-07-07 DIAGNOSIS — L814 Other melanin hyperpigmentation: Secondary | ICD-10-CM | POA: Diagnosis not present

## 2019-07-07 DIAGNOSIS — D485 Neoplasm of uncertain behavior of skin: Secondary | ICD-10-CM | POA: Diagnosis not present

## 2019-07-07 DIAGNOSIS — L853 Xerosis cutis: Secondary | ICD-10-CM | POA: Diagnosis not present

## 2019-07-07 DIAGNOSIS — D1801 Hemangioma of skin and subcutaneous tissue: Secondary | ICD-10-CM | POA: Diagnosis not present

## 2019-07-07 DIAGNOSIS — L821 Other seborrheic keratosis: Secondary | ICD-10-CM | POA: Diagnosis not present

## 2019-07-07 DIAGNOSIS — L304 Erythema intertrigo: Secondary | ICD-10-CM | POA: Diagnosis not present

## 2019-07-07 DIAGNOSIS — L728 Other follicular cysts of the skin and subcutaneous tissue: Secondary | ICD-10-CM | POA: Diagnosis not present

## 2019-07-07 DIAGNOSIS — D225 Melanocytic nevi of trunk: Secondary | ICD-10-CM | POA: Diagnosis not present

## 2019-07-14 DIAGNOSIS — Z1231 Encounter for screening mammogram for malignant neoplasm of breast: Secondary | ICD-10-CM | POA: Diagnosis not present

## 2019-07-14 DIAGNOSIS — M5416 Radiculopathy, lumbar region: Secondary | ICD-10-CM | POA: Diagnosis not present

## 2019-07-14 DIAGNOSIS — M48061 Spinal stenosis, lumbar region without neurogenic claudication: Secondary | ICD-10-CM | POA: Diagnosis not present

## 2019-07-14 DIAGNOSIS — M4125 Other idiopathic scoliosis, thoracolumbar region: Secondary | ICD-10-CM | POA: Diagnosis not present

## 2019-07-14 DIAGNOSIS — Z6835 Body mass index (BMI) 35.0-35.9, adult: Secondary | ICD-10-CM | POA: Diagnosis not present

## 2019-07-14 DIAGNOSIS — R030 Elevated blood-pressure reading, without diagnosis of hypertension: Secondary | ICD-10-CM | POA: Diagnosis not present

## 2019-07-16 ENCOUNTER — Other Ambulatory Visit: Payer: Self-pay

## 2019-07-16 ENCOUNTER — Other Ambulatory Visit (HOSPITAL_COMMUNITY): Payer: Self-pay | Admitting: Physician Assistant

## 2019-07-16 ENCOUNTER — Ambulatory Visit (HOSPITAL_COMMUNITY)
Admission: RE | Admit: 2019-07-16 | Discharge: 2019-07-16 | Disposition: A | Discharge status: Home / Self Care | Payer: Medicare Other | Source: Ambulatory Visit | Attending: Physician Assistant | Admitting: Physician Assistant

## 2019-07-16 DIAGNOSIS — E6609 Other obesity due to excess calories: Secondary | ICD-10-CM | POA: Diagnosis not present

## 2019-07-16 DIAGNOSIS — M79672 Pain in left foot: Secondary | ICD-10-CM | POA: Diagnosis not present

## 2019-07-16 DIAGNOSIS — Z6835 Body mass index (BMI) 35.0-35.9, adult: Secondary | ICD-10-CM | POA: Diagnosis not present

## 2019-07-16 DIAGNOSIS — N342 Other urethritis: Secondary | ICD-10-CM | POA: Diagnosis not present

## 2019-07-19 DIAGNOSIS — E063 Autoimmune thyroiditis: Secondary | ICD-10-CM | POA: Diagnosis not present

## 2019-07-19 DIAGNOSIS — E7849 Other hyperlipidemia: Secondary | ICD-10-CM | POA: Diagnosis not present

## 2019-07-19 DIAGNOSIS — I10 Essential (primary) hypertension: Secondary | ICD-10-CM | POA: Diagnosis not present

## 2019-07-31 DIAGNOSIS — D519 Vitamin B12 deficiency anemia, unspecified: Secondary | ICD-10-CM | POA: Diagnosis not present

## 2019-07-31 DIAGNOSIS — E6609 Other obesity due to excess calories: Secondary | ICD-10-CM | POA: Diagnosis not present

## 2019-07-31 DIAGNOSIS — N342 Other urethritis: Secondary | ICD-10-CM | POA: Diagnosis not present

## 2019-07-31 DIAGNOSIS — Z6836 Body mass index (BMI) 36.0-36.9, adult: Secondary | ICD-10-CM | POA: Diagnosis not present

## 2019-07-31 DIAGNOSIS — N390 Urinary tract infection, site not specified: Secondary | ICD-10-CM | POA: Diagnosis not present

## 2019-08-05 DIAGNOSIS — M48061 Spinal stenosis, lumbar region without neurogenic claudication: Secondary | ICD-10-CM | POA: Diagnosis not present

## 2019-08-05 DIAGNOSIS — F419 Anxiety disorder, unspecified: Secondary | ICD-10-CM | POA: Diagnosis not present

## 2019-08-11 DIAGNOSIS — N3946 Mixed incontinence: Secondary | ICD-10-CM | POA: Diagnosis not present

## 2019-08-11 DIAGNOSIS — N390 Urinary tract infection, site not specified: Secondary | ICD-10-CM | POA: Diagnosis not present

## 2019-08-12 DIAGNOSIS — D0439 Carcinoma in situ of skin of other parts of face: Secondary | ICD-10-CM | POA: Diagnosis not present

## 2019-08-12 DIAGNOSIS — C44319 Basal cell carcinoma of skin of other parts of face: Secondary | ICD-10-CM | POA: Diagnosis not present

## 2019-08-12 DIAGNOSIS — L821 Other seborrheic keratosis: Secondary | ICD-10-CM | POA: Diagnosis not present

## 2019-08-19 DIAGNOSIS — I10 Essential (primary) hypertension: Secondary | ICD-10-CM | POA: Diagnosis not present

## 2019-08-19 DIAGNOSIS — E7849 Other hyperlipidemia: Secondary | ICD-10-CM | POA: Diagnosis not present

## 2019-08-19 DIAGNOSIS — E063 Autoimmune thyroiditis: Secondary | ICD-10-CM | POA: Diagnosis not present

## 2019-09-01 DIAGNOSIS — D519 Vitamin B12 deficiency anemia, unspecified: Secondary | ICD-10-CM | POA: Diagnosis not present

## 2019-09-07 DIAGNOSIS — G894 Chronic pain syndrome: Secondary | ICD-10-CM | POA: Diagnosis not present

## 2019-09-07 DIAGNOSIS — M4125 Other idiopathic scoliosis, thoracolumbar region: Secondary | ICD-10-CM | POA: Diagnosis not present

## 2019-09-14 DIAGNOSIS — M5416 Radiculopathy, lumbar region: Secondary | ICD-10-CM | POA: Diagnosis not present

## 2019-09-14 DIAGNOSIS — G894 Chronic pain syndrome: Secondary | ICD-10-CM | POA: Diagnosis not present

## 2019-09-14 DIAGNOSIS — M4125 Other idiopathic scoliosis, thoracolumbar region: Secondary | ICD-10-CM | POA: Diagnosis not present

## 2019-09-18 DIAGNOSIS — I10 Essential (primary) hypertension: Secondary | ICD-10-CM | POA: Diagnosis not present

## 2019-09-18 DIAGNOSIS — E063 Autoimmune thyroiditis: Secondary | ICD-10-CM | POA: Diagnosis not present

## 2019-09-18 DIAGNOSIS — E7849 Other hyperlipidemia: Secondary | ICD-10-CM | POA: Diagnosis not present

## 2019-09-22 DIAGNOSIS — Z6835 Body mass index (BMI) 35.0-35.9, adult: Secondary | ICD-10-CM | POA: Diagnosis not present

## 2019-09-22 DIAGNOSIS — N390 Urinary tract infection, site not specified: Secondary | ICD-10-CM | POA: Diagnosis not present

## 2019-09-22 DIAGNOSIS — M1991 Primary osteoarthritis, unspecified site: Secondary | ICD-10-CM | POA: Diagnosis not present

## 2019-09-22 DIAGNOSIS — E6609 Other obesity due to excess calories: Secondary | ICD-10-CM | POA: Diagnosis not present

## 2019-09-22 DIAGNOSIS — I10 Essential (primary) hypertension: Secondary | ICD-10-CM | POA: Diagnosis not present

## 2019-09-29 DIAGNOSIS — M5442 Lumbago with sciatica, left side: Secondary | ICD-10-CM | POA: Diagnosis not present

## 2019-09-29 DIAGNOSIS — G8929 Other chronic pain: Secondary | ICD-10-CM | POA: Diagnosis not present

## 2019-09-29 DIAGNOSIS — M5441 Lumbago with sciatica, right side: Secondary | ICD-10-CM | POA: Diagnosis not present

## 2019-10-13 DIAGNOSIS — M5127 Other intervertebral disc displacement, lumbosacral region: Secondary | ICD-10-CM | POA: Diagnosis not present

## 2019-10-13 DIAGNOSIS — M48061 Spinal stenosis, lumbar region without neurogenic claudication: Secondary | ICD-10-CM | POA: Diagnosis not present

## 2019-10-13 DIAGNOSIS — M5442 Lumbago with sciatica, left side: Secondary | ICD-10-CM | POA: Diagnosis not present

## 2019-10-16 DIAGNOSIS — D519 Vitamin B12 deficiency anemia, unspecified: Secondary | ICD-10-CM | POA: Diagnosis not present

## 2019-10-16 DIAGNOSIS — E6609 Other obesity due to excess calories: Secondary | ICD-10-CM | POA: Diagnosis not present

## 2019-10-16 DIAGNOSIS — J329 Chronic sinusitis, unspecified: Secondary | ICD-10-CM | POA: Diagnosis not present

## 2019-10-16 DIAGNOSIS — E039 Hypothyroidism, unspecified: Secondary | ICD-10-CM | POA: Diagnosis not present

## 2019-10-16 DIAGNOSIS — N3000 Acute cystitis without hematuria: Secondary | ICD-10-CM | POA: Diagnosis not present

## 2019-10-16 DIAGNOSIS — E782 Mixed hyperlipidemia: Secondary | ICD-10-CM | POA: Diagnosis not present

## 2019-10-16 DIAGNOSIS — Z1389 Encounter for screening for other disorder: Secondary | ICD-10-CM | POA: Diagnosis not present

## 2019-10-16 DIAGNOSIS — Z0000 Encounter for general adult medical examination without abnormal findings: Secondary | ICD-10-CM | POA: Diagnosis not present

## 2019-10-16 DIAGNOSIS — Z0001 Encounter for general adult medical examination with abnormal findings: Secondary | ICD-10-CM | POA: Diagnosis not present

## 2019-10-16 DIAGNOSIS — Z6835 Body mass index (BMI) 35.0-35.9, adult: Secondary | ICD-10-CM | POA: Diagnosis not present

## 2019-10-16 DIAGNOSIS — E7849 Other hyperlipidemia: Secondary | ICD-10-CM | POA: Diagnosis not present

## 2019-10-16 DIAGNOSIS — R1319 Other dysphagia: Secondary | ICD-10-CM | POA: Diagnosis not present

## 2019-10-19 DIAGNOSIS — E063 Autoimmune thyroiditis: Secondary | ICD-10-CM | POA: Diagnosis not present

## 2019-10-19 DIAGNOSIS — I10 Essential (primary) hypertension: Secondary | ICD-10-CM | POA: Diagnosis not present

## 2019-10-19 DIAGNOSIS — E7849 Other hyperlipidemia: Secondary | ICD-10-CM | POA: Diagnosis not present

## 2019-10-26 DIAGNOSIS — D0439 Carcinoma in situ of skin of other parts of face: Secondary | ICD-10-CM | POA: Diagnosis not present

## 2019-10-26 DIAGNOSIS — C44319 Basal cell carcinoma of skin of other parts of face: Secondary | ICD-10-CM | POA: Diagnosis not present

## 2019-10-27 DIAGNOSIS — M4185 Other forms of scoliosis, thoracolumbar region: Secondary | ICD-10-CM | POA: Diagnosis not present

## 2019-10-29 DIAGNOSIS — I83893 Varicose veins of bilateral lower extremities with other complications: Secondary | ICD-10-CM | POA: Diagnosis not present

## 2019-10-29 DIAGNOSIS — I8311 Varicose veins of right lower extremity with inflammation: Secondary | ICD-10-CM | POA: Diagnosis not present

## 2019-10-29 DIAGNOSIS — I83813 Varicose veins of bilateral lower extremities with pain: Secondary | ICD-10-CM | POA: Diagnosis not present

## 2019-10-29 DIAGNOSIS — I8312 Varicose veins of left lower extremity with inflammation: Secondary | ICD-10-CM | POA: Diagnosis not present

## 2019-11-02 DIAGNOSIS — I8311 Varicose veins of right lower extremity with inflammation: Secondary | ICD-10-CM | POA: Diagnosis not present

## 2019-11-02 DIAGNOSIS — I8312 Varicose veins of left lower extremity with inflammation: Secondary | ICD-10-CM | POA: Diagnosis not present

## 2019-11-05 DIAGNOSIS — N393 Stress incontinence (female) (male): Secondary | ICD-10-CM | POA: Diagnosis not present

## 2019-11-05 DIAGNOSIS — N390 Urinary tract infection, site not specified: Secondary | ICD-10-CM | POA: Diagnosis not present

## 2019-11-05 DIAGNOSIS — N3281 Overactive bladder: Secondary | ICD-10-CM | POA: Diagnosis not present

## 2019-11-18 DIAGNOSIS — I10 Essential (primary) hypertension: Secondary | ICD-10-CM | POA: Diagnosis not present

## 2019-11-18 DIAGNOSIS — E7849 Other hyperlipidemia: Secondary | ICD-10-CM | POA: Diagnosis not present

## 2019-11-18 DIAGNOSIS — E063 Autoimmune thyroiditis: Secondary | ICD-10-CM | POA: Diagnosis not present

## 2019-11-24 ENCOUNTER — Ambulatory Visit (INDEPENDENT_AMBULATORY_CARE_PROVIDER_SITE_OTHER): Payer: Medicare Other | Admitting: Internal Medicine

## 2019-11-24 ENCOUNTER — Encounter: Payer: Self-pay | Admitting: Internal Medicine

## 2019-11-24 VITALS — BP 134/76 | HR 71 | Ht 60.0 in | Wt 178.0 lb

## 2019-11-24 DIAGNOSIS — K449 Diaphragmatic hernia without obstruction or gangrene: Secondary | ICD-10-CM

## 2019-11-24 DIAGNOSIS — R1319 Other dysphagia: Secondary | ICD-10-CM

## 2019-11-24 DIAGNOSIS — Z9981 Dependence on supplemental oxygen: Secondary | ICD-10-CM | POA: Diagnosis not present

## 2019-11-24 DIAGNOSIS — R1310 Dysphagia, unspecified: Secondary | ICD-10-CM

## 2019-11-24 NOTE — H&P (View-Only) (Signed)
Audrey [name] 79 y.o. 1941-02-01 [id number]  Assessment & Plan:   Encounter Diagnoses  Name Primary?   Esophageal dysphagia Yes   Hiatal hernia    Dependence on nocturnal oxygen therapy     Evaluate with upper GI endoscopy possible esophageal dilation.  Further plans pending those results.  Because of her oxygen requirements she is at higher risk of problems with sedation and we will perform her procedure at the hospital setting rather than the ambulatory center setting.  She could have a paraesophageal hiatal hernia, motility disturbance is more common in people with autoimmune dysfunction like she has.  She has the Raynaud's phenomenon.  Also history of chronic lymphocytic thyroiditis.  The risks and benefits as well as alternatives of endoscopic procedure(s) have been discussed and reviewed. All questions answered. The patient agrees to proceed.   I appreciate the opportunity to care for this patient. CC: Redmond School, MD    Subjective:   Chief Complaint: dysphagia   HPI The patient is a 79 year old white woman with a long history of intermittent dysphagia with multiple negative EGD exams last in 2015 (no strictures), questionable response to [facility] dilation and a normal manometry a number of years ago, who presents for follow-up because of a new diagnosis of hiatal hernia made by chest x-ray (images reviewed) in January of this year, and recurrent dysphagia to solid foods.  She waits in the food passes.  She is having some reflux symptoms and a bile taste in she says it seems like "fumes" come up.  Occasionally has some nocturnal reflux with dietary indiscretion.  She has had some cough at times.  There is some weight decrease as below.  She has intermittent periods of nausea as well.  No specific chest pain.  She is on oxygen at night. Wt Readings from Last 3 Encounters:  11/24/19 178 lb (80.7 kg)  12/10/18 186 lb 12.8 oz (84.7 kg)  10/11/18 185 lb (83.9 kg)     Review of systems notable for recent presumed sinus infection treated with antibiotics.  It is better but not gone.  She relates given her mother's history of pancreatic cancer and cancer in the family she is somewhat concerned about that potential in her.  She was told after she had an InterStim placement at  [facility], by the anesthesiologist that she ought to get follow-up for her reflux through GI.  The InterStim for her bladder is making a difference in reducing her urinary incontinence problems.  Allergies  Allergen Reactions   Erythromycin Diarrhea   Other Hives and Other (See Comments)    Allergen - EKG electrodes   Statins Other (See Comments)    Makes her hurt bad   Sulfa Antibiotics Hives, Nausea Only and Rash   Tramadol Nausea And Vomiting and Other (See Comments)    medicine has her dizzy and nauseated    Vytorin [Ezetimibe-Simvastatin] Hives   Adhesive [Tape] Rash   Current Meds  Medication Sig   alendronate (FOSAMAX) 70 MG tablet Take 70 mg by mouth every Sunday. Take with a full glass of water on an empty stomach.    Calcium Carb-Cholecalciferol (CALCIUM+D3 PO) Take 1 tablet by mouth daily. 1200 mg (Calcium)-25 mcg (D3)   chlordiazePOXIDE (LIBRIUM) 10 MG capsule Take 10 mg by mouth 2 (two) times daily.    Cholecalciferol (VITAMIN D3) 2000 UNITS TABS Take 4,000 Units by mouth at bedtime.    Coenzyme Q10 200 MG capsule Take 200 mg by mouth daily.  cyanocobalamin (,VITAMIN B-12,) 1000 MCG/ML injection Inject 1,000 mcg into the muscle every 30 (thirty) days.   fluticasone (FLONASE) 50 MCG/ACT nasal spray Place 1 spray into both nostrils daily. In the morning   furosemide (LASIX) 40 MG tablet Take 40 mg by mouth daily.    gabapentin (NEURONTIN) 600 MG tablet Take 600 mg by mouth 2 (two) times daily.    Krill Oil 500 MG CAPS Take 500 mg by mouth daily.   levothyroxine (SYNTHROID, LEVOTHROID) 50 MCG tablet Take 50 mcg by mouth daily before breakfast.     losartan-hydrochlorothiazide (HYZAAR) 100-25 MG per tablet Take 0.5 tablets by mouth daily.    OXYGEN Inhale 2 L into the lungs at bedtime.    pantoprazole (PROTONIX) 40 MG tablet TAKE 1 TABLET BY MOUTH DAILY BEFORE BREAKFAST AND TAKE 1 TABLET DAILY BEFORE DINNER   potassium chloride SA (K-DUR,KLOR-CON) 20 MEQ tablet Take 40 mEq by mouth 2 (two) times daily.    PREMARIN vaginal cream Place 0.5 g vaginally at bedtime as needed. For discomfort/dryness   rosuvastatin (CRESTOR) 5 MG tablet Take 5 mg by mouth every Monday, Wednesday, and Friday. In the morning.   SAVELLA 100 MG TABS tablet Take 100 mg by mouth 2 (two) times daily.   trimethoprim (TRIMPEX) 100 MG tablet Take 100 mg by mouth daily.   Past Medical History:  Diagnosis Date   Allergic rhinitis    Anxiety and depression    Arthritis    Basal cell carcinoma 2002   Chronic lymphocytic thyroiditis    Chronic pain syndrome    Colon polyp    Depression    Diverticulosis    Dyspnea    with excertion   Family history of heart disease    Fibromyalgia    Gastric polyps    GERD (gastroesophageal reflux disease)    Hashimoto's disease    Hiatal hernia    History of nuclear stress test 04/2010   nonischemic    HLD (hyperlipidemia)    HTN (hypertension)    Hypothyroidism    Interstitial cystitis    Meniere's disease    Obesity    Pinched vertebral nerve    PONV (postoperative nausea and vomiting)    Raynaud's syndrome    Right hip pain 08/18/2012   Sleep apnea    Tuberculosis    postive skin test no symptoms  was around mom that had it   Vitamin B12 deficiency    Past Surgical History:  Procedure Laterality Date   ABDOMINAL HYSTERECTOMY  1982   BASAL CELL CARCINOMA EXCISION  03-31-01   face   BLADDER REPAIR  2005   ovaries, tubes removed; pelvic prolapse corrected; rectal repair   BLADDER SURGERY  01-2009   with mesh placement   CATARACT EXTRACTION     bilateral   COLONOSCOPY  W/ BIOPSIES AND POLYPECTOMY  12/20/2009   diverticulosis, adenomatous polyps   COLONOSCOPY WITH PROPOFOL N/A 08/26/2017   Procedure: COLONOSCOPY WITH PROPOFOL;  Surgeon: Gatha Mayer, MD;  Location: [facility];  Service: Endoscopy;  Laterality: N/A;   ENDOVENOUS ABLATION SAPHENOUS VEIN W/ LASER Left 08/07/2016   endovenous laser ablation left small saphenous vein by Tinnie Gens MD   ESOPHAGEAL MANOMETRY N/A 05/25/2013   Procedure: ESOPHAGEAL MANOMETRY (EM);  Surgeon: Gatha Mayer, MD;  Location: [facility];  Service: Endoscopy;  Laterality: N/A;   ESOPHAGOGASTRODUODENOSCOPY (EGD) WITH ESOPHAGEAL DILATION  06/09/2012   Procedure: ESOPHAGOGASTRODUODENOSCOPY (EGD) WITH ESOPHAGEAL DILATION;  Surgeon: Gatha Mayer, MD;  Location:  [facility];  Service: Endoscopy;  Laterality: N/A;   FOOT SURGERY  1999   bilateral   HAMMER TOE SURGERY Right 11/13/2017   Procedure: HAMMER TOE REPAIR RIGHT 4TH TOE AND 5TH TOE;  Surgeon: Caprice Beaver, DPM;  Location: [facility];  Service: Podiatry;  Laterality: Right;   INTERSTIM IMPLANT PLACEMENT  01/2012   [facility]   laser surgery bladder  05-25-10   on mesh   LIPOMA EXCISION     right arm   NASOLACRIMAL DUCT PROBING  09-12-10   right eye   NASOLACRIMAL DUCT PROBING  09-26-10   left eye   OSTECTOMY Right 11/13/2017   Procedure: OSTECTOMY RIGHT 5TH TOE;  Surgeon: Caprice Beaver, DPM;  Location: [facility];  Service: Podiatry;  Laterality: Right;   removal of bladder mesh  03-01-11   [facility]   TONSILLECTOMY  1959   TRANSTHORACIC ECHOCARDIOGRAM  12/2006   EF=>55%; LA mild-mod dilated; RA mildly dilated; mild mitral annular calcif, mild MR; mod TR & elevated RVSP; mild pulm vavle regurg   UPPER GASTROINTESTINAL ENDOSCOPY  12/20/2009   GERD   Social History   Social History Narrative   Married   1 son and 2 daughters, 10 grandchildren   Remains active   family history includes Breast cancer in her maternal aunt; Clotting disorder in her grandchild  and paternal grandmother; Colon cancer in her maternal grandfather and maternal uncle; Colon polyps in her father; Crohn's disease in her grandchild; Diabetes in her brother, brother, father, maternal grandmother, and mother; Heart disease in her father and paternal grandfather; Hyperlipidemia in her father; Hypertension in her father and son; Pancreatic cancer in her mother; Prostate cancer in her father; Stroke in her brother and brother; Tuberculosis in her mother; Von Willebrand disease in her daughter and daughter.   Review of Systems Edema of the lower extremities, Raynaud's syndrome.  As per HPI.  Otherwise negative.   Objective:   Physical Exam BP 134/76    Pulse 71    Ht 5' (1.524 m)    Wt 178 lb (80.7 kg)    BMI 34.76 kg/m  Pleasant obese elderly white woman in no acute distress Eyes are anicteric Teeth in good repair Lungs are clear with good air movement Heart sounds are normal The abdomen is obese soft and nontender without organomegaly or mass or hernia detected She is alert and oriented x3   Data reviewed includes primary care notes from May of this year including labs that showed normal electrolytes liver function TSH CBC creatinine is slightly elevated at 1.17 with a GFR of 45 indicating some mild chronic kidney disease

## 2019-11-24 NOTE — Progress Notes (Signed)
Audrey [name] 79 y.o. [birth year] [id number]  Assessment & Plan:   Encounter Diagnoses  Name Primary?  . Esophageal dysphagia Yes  . Hiatal hernia   . Dependence on nocturnal oxygen therapy     Evaluate with upper GI endoscopy possible esophageal dilation.  Further plans pending those results.  Because of her oxygen requirements she is at higher risk of problems with sedation and we will perform her procedure at the hospital setting rather than the ambulatory center setting.  She could have a paraesophageal hiatal hernia, motility disturbance is more common in people with autoimmune dysfunction like she has.  She has the Raynaud's phenomenon.  Also history of chronic lymphocytic thyroiditis.  The risks and benefits as well as alternatives of endoscopic procedure(s) have been discussed and reviewed. All questions answered. The patient agrees to proceed.   I appreciate the opportunity to care for this patient. CC: Redmond School, MD    Subjective:   Chief Complaint: dysphagia   HPI The patient is a 79 year old white woman with a long history of intermittent dysphagia with multiple negative EGD exams last in 2015 (no strictures), questionable response to [facility] dilation and a normal manometry a number of years ago, who presents for follow-up because of a new diagnosis of hiatal hernia made by chest x-ray (images reviewed) in January of this year, and recurrent dysphagia to solid foods.  She waits in the food passes.  She is having some reflux symptoms and a bile taste in she says it seems like "fumes" come up.  Occasionally has some nocturnal reflux with dietary indiscretion.  She has had some cough at times.  There is some weight decrease as below.  She has intermittent periods of nausea as well.  No specific chest pain.  She is on oxygen at night. Wt Readings from Last 3 Encounters:  11/24/19 178 lb (80.7 kg)  12/10/18 186 lb 12.8 oz (84.7 kg)  10/11/18 185 lb (83.9 kg)     Review of systems notable for recent presumed sinus infection treated with antibiotics.  It is better but not gone.  She relates given her mother's history of pancreatic cancer and cancer in the family she is somewhat concerned about that potential in her.  She was told after she had an InterStim placement at [facility], by the anesthesiologist that she ought to get follow-up for her reflux through GI.  The InterStim for her bladder is making a difference in reducing her urinary incontinence problems.  Allergies  Allergen Reactions  . Erythromycin Diarrhea  . Other Hives and Other (See Comments)    Allergen - EKG electrodes  . Statins Other (See Comments)    Makes her hurt bad  . Sulfa Antibiotics Hives, Nausea Only and Rash  . Tramadol Nausea And Vomiting and Other (See Comments)    medicine has her dizzy and nauseated   . Vytorin [Ezetimibe-Simvastatin] Hives  . Adhesive [Tape] Rash   Current Meds  Medication Sig  . alendronate (FOSAMAX) 70 MG tablet Take 70 mg by mouth every Sunday. Take with a full glass of water on an empty stomach.   . Calcium Carb-Cholecalciferol (CALCIUM+D3 PO) Take 1 tablet by mouth daily. 1200 mg (Calcium)-25 mcg (D3)  . chlordiazePOXIDE (LIBRIUM) 10 MG capsule Take 10 mg by mouth 2 (two) times daily.   . Cholecalciferol (VITAMIN D3) 2000 UNITS TABS Take 4,000 Units by mouth at bedtime.   . Coenzyme Q10 200 MG capsule Take 200 mg by mouth daily.  Marland Kitchen  cyanocobalamin (,VITAMIN B-12,) 1000 MCG/ML injection Inject 1,000 mcg into the muscle every 30 (thirty) days.  . fluticasone (FLONASE) 50 MCG/ACT nasal spray Place 1 spray into both nostrils daily. In the morning  . furosemide (LASIX) 40 MG tablet Take 40 mg by mouth daily.   Marland Kitchen gabapentin (NEURONTIN) 600 MG tablet Take 600 mg by mouth 2 (two) times daily.   Javier Docker Oil 500 MG CAPS Take 500 mg by mouth daily.  Marland Kitchen levothyroxine (SYNTHROID, LEVOTHROID) 50 MCG tablet Take 50 mcg by mouth daily before breakfast.   .  losartan-hydrochlorothiazide (HYZAAR) 100-25 MG per tablet Take 0.5 tablets by mouth daily.   . OXYGEN Inhale 2 L into the lungs at bedtime.   . pantoprazole (PROTONIX) 40 MG tablet TAKE 1 TABLET BY MOUTH DAILY BEFORE BREAKFAST AND TAKE 1 TABLET DAILY BEFORE DINNER  . potassium chloride SA (K-DUR,KLOR-CON) 20 MEQ tablet Take 40 mEq by mouth 2 (two) times daily.   Marland Kitchen PREMARIN vaginal cream Place 0.5 g vaginally at bedtime as needed. For discomfort/dryness  . rosuvastatin (CRESTOR) 5 MG tablet Take 5 mg by mouth every Monday, Wednesday, and Friday. In the morning.  Marland Kitchen SAVELLA 100 MG TABS tablet Take 100 mg by mouth 2 (two) times daily.  Marland Kitchen trimethoprim (TRIMPEX) 100 MG tablet Take 100 mg by mouth daily.   Past Medical History:  Diagnosis Date  . Allergic rhinitis   . Anxiety and depression   . Arthritis   . Basal cell carcinoma 2002  . Chronic lymphocytic thyroiditis   . Chronic pain syndrome   . Colon polyp   . Depression   . Diverticulosis   . Dyspnea    with excertion  . Family history of heart disease   . Fibromyalgia   . Gastric polyps   . GERD (gastroesophageal reflux disease)   . Hashimoto's disease   . Hiatal hernia   . History of nuclear stress test 04/2010   nonischemic   . HLD (hyperlipidemia)   . HTN (hypertension)   . Hypothyroidism   . Interstitial cystitis   . Meniere's disease   . Obesity   . Pinched vertebral nerve   . PONV (postoperative nausea and vomiting)   . Raynaud's syndrome   . Right hip pain 08/18/2012  . Sleep apnea   . Tuberculosis    postive skin test no symptoms  was around mom that had it  . Vitamin B12 deficiency    Past Surgical History:  Procedure Laterality Date  . ABDOMINAL HYSTERECTOMY  1982  . BASAL CELL CARCINOMA EXCISION  03-31-01   face  . BLADDER REPAIR  2005   ovaries, tubes removed; pelvic prolapse corrected; rectal repair  . BLADDER SURGERY  01-2009   with mesh placement  . CATARACT EXTRACTION     bilateral  . COLONOSCOPY  W/ BIOPSIES AND POLYPECTOMY  12/20/2009   diverticulosis, adenomatous polyps  . COLONOSCOPY WITH PROPOFOL N/A 08/26/2017   Procedure: COLONOSCOPY WITH PROPOFOL;  Surgeon: Gatha Mayer, MD;  Location: [facility];  Service: Endoscopy;  Laterality: N/A;  . ENDOVENOUS ABLATION SAPHENOUS VEIN W/ LASER Left 08/07/2016   endovenous laser ablation left small saphenous vein by Tinnie Gens MD  . ESOPHAGEAL MANOMETRY N/A 05/25/2013   Procedure: ESOPHAGEAL MANOMETRY (EM);  Surgeon: Gatha Mayer, MD;  Location: [facility];  Service: Endoscopy;  Laterality: N/A;  . ESOPHAGOGASTRODUODENOSCOPY (EGD) WITH ESOPHAGEAL DILATION  06/09/2012   Procedure: ESOPHAGOGASTRODUODENOSCOPY (EGD) WITH ESOPHAGEAL DILATION;  Surgeon: Gatha Mayer, MD;  Location:  [facility];  Service: Endoscopy;  Laterality: N/A;  . FOOT SURGERY  1999   bilateral  . HAMMER TOE SURGERY Right 11/13/2017   Procedure: HAMMER TOE REPAIR RIGHT 4TH TOE AND 5TH TOE;  Surgeon: Caprice Beaver, DPM;  Location: [facility];  Service: Podiatry;  Laterality: Right;  . INTERSTIM IMPLANT PLACEMENT  01/2012   [facility]  . laser surgery bladder  05-25-10   on mesh  . LIPOMA EXCISION     right arm  . NASOLACRIMAL DUCT PROBING  09-12-10   right eye  . NASOLACRIMAL DUCT PROBING  09-26-10   left eye  . OSTECTOMY Right 11/13/2017   Procedure: OSTECTOMY RIGHT 5TH TOE;  Surgeon: Caprice Beaver, DPM;  Location: [facility];  Service: Podiatry;  Laterality: Right;  . removal of bladder mesh  03-01-11   [facility]  . TONSILLECTOMY  1959  . TRANSTHORACIC ECHOCARDIOGRAM  12/2006   EF=>55%; LA mild-mod dilated; RA mildly dilated; mild mitral annular calcif, mild MR; mod TR & elevated RVSP; mild pulm vavle regurg  . UPPER GASTROINTESTINAL ENDOSCOPY  12/20/2009   GERD   Social History   Social History Narrative   Married   1 son and 2 daughters, 10 grandchildren   Remains active   family history includes Breast cancer in her maternal aunt; Clotting disorder in her grandchild  and paternal grandmother; Colon cancer in her maternal grandfather and maternal uncle; Colon polyps in her father; Crohn's disease in her grandchild; Diabetes in her brother, brother, father, maternal grandmother, and mother; Heart disease in her father and paternal grandfather; Hyperlipidemia in her father; Hypertension in her father and son; Pancreatic cancer in her mother; Prostate cancer in her father; Stroke in her brother and brother; Tuberculosis in her mother; Von Willebrand disease in her daughter and daughter.   Review of Systems Edema of the lower extremities, Raynaud's syndrome.  As per HPI.  Otherwise negative.   Objective:   Physical Exam BP 134/76   Pulse 71   Ht 5' (1.524 m)   Wt 178 lb (80.7 kg)   BMI 34.76 kg/m  Pleasant obese elderly white woman in no acute distress Eyes are anicteric Teeth in good repair Lungs are clear with good air movement Heart sounds are normal The abdomen is obese soft and nontender without organomegaly or mass or hernia detected She is alert and oriented x3   Data reviewed includes primary care notes from May of this year including labs that showed normal electrolytes liver function TSH CBC creatinine is slightly elevated at 1.17 with a GFR of 45 indicating some mild chronic kidney disease

## 2019-11-24 NOTE — Patient Instructions (Signed)
  You have been scheduled for an endoscopy. Please follow written instructions given to you at your visit today. If you use inhalers (even only as needed), please bring them with you on the day of your procedure.   I appreciate the opportunity to care for you. Carl Gessner, MD, FACG 

## 2019-11-26 ENCOUNTER — Other Ambulatory Visit: Payer: Self-pay

## 2019-11-26 ENCOUNTER — Encounter: Payer: Self-pay | Admitting: Vascular Surgery

## 2019-11-26 ENCOUNTER — Other Ambulatory Visit (HOSPITAL_COMMUNITY)
Admission: RE | Admit: 2019-11-26 | Discharge: 2019-11-26 | Disposition: A | Discharge status: Home / Self Care | Payer: Medicare Other | Source: Ambulatory Visit | Attending: Internal Medicine | Admitting: Internal Medicine

## 2019-11-26 ENCOUNTER — Ambulatory Visit (INDEPENDENT_AMBULATORY_CARE_PROVIDER_SITE_OTHER): Payer: Medicare Other | Admitting: Vascular Surgery

## 2019-11-26 VITALS — BP 124/77 | HR 105 | Temp 97.9°F | Resp 18 | Ht 60.0 in | Wt 178.5 lb

## 2019-11-26 DIAGNOSIS — I872 Venous insufficiency (chronic) (peripheral): Secondary | ICD-10-CM

## 2019-11-26 DIAGNOSIS — Z20822 Contact with and (suspected) exposure to covid-19: Secondary | ICD-10-CM | POA: Diagnosis not present

## 2019-11-26 DIAGNOSIS — Z01812 Encounter for preprocedural laboratory examination: Secondary | ICD-10-CM | POA: Insufficient documentation

## 2019-11-26 LAB — SARS CORONAVIRUS 2 (TAT 6-24 HRS): SARS Coronavirus 2: NEGATIVE

## 2019-11-26 NOTE — Progress Notes (Signed)
REASON FOR CONSULT:    Chronic venous insufficiency.  The consult is requested by Dr. Redmond School.  ASSESSMENT & PLAN:   CHRONIC VENOUS INSUFFICIENCY:  Based on this patient's exam she does have evidence of chronic venous insufficiency.  She has CEAP C4a venous disease.  We have discussed the importance of intermittent leg elevation the proper positioning for this.  In addition I recommended that she continue to wear her knee-high stockings.  I have encouraged her to avoid prolonged sitting and standing.  We discussed the importance of exercise specifically walking and water aerobics.  I encouraged her to continue to work on her weight as central obesity especially increases lower extremity venous pressure.  If her symptoms progress in the future then certainly we could obtain formal venous reflux testing bilaterally.  She will call if her symptoms worsen.  Also instructed her to keep her skin well lubricated in the winter.  [name], MD Office: [phone]   HPI:   Audrey [name] is a pleasant 79 y.o. female, who is undergone previous laser ablation of the left great saphenous vein and stab phlebectomies in 2018 by Dr. Kellie Simmering.  She is referred today because of some hyperpigmentation in both lower extremities but especially on the left.  She does describe some aching pain and heaviness in both legs which is aggravated by standing and relieved somewhat with elevation.  Her symptoms are worse at the end of the day.  She states that she has intentionally lost 12 to 15 pounds by watching her diet and that this has significantly helped her symptoms.  She denies any previous history of DVT.  She wears compression stockings in the winter only as the stockings are too hot otherwise.  She thinks these are 20-30 gradient knee-high stockings.  Again she does elevate her legs which helps.  Past Medical History:  Diagnosis Date  . Allergic rhinitis   . Anxiety and depression   . Arthritis   .  Basal cell carcinoma 2002  . Chronic lymphocytic thyroiditis   . Chronic pain syndrome   . Colon polyp   . Depression   . Diverticulosis   . Dyspnea    with excertion  . Family history of heart disease   . Fibromyalgia   . Gastric polyps   . GERD (gastroesophageal reflux disease)   . Hashimoto's disease   . Hiatal hernia   . History of nuclear stress test 04/2010   nonischemic   . HLD (hyperlipidemia)   . HTN (hypertension)   . Hypothyroidism   . Interstitial cystitis   . Meniere's disease   . Obesity   . Pinched vertebral nerve   . PONV (postoperative nausea and vomiting)   . Raynaud's syndrome   . Right hip pain 08/18/2012  . Sleep apnea   . Tuberculosis    postive skin test no symptoms  was around mom that had it  . Vitamin B12 deficiency     Family History  Problem Relation Age of Onset  . Pancreatic cancer Mother   . Diabetes Mother   . Tuberculosis Mother   . Prostate cancer Father   . Colon polyps Father   . Diabetes Father   . Hyperlipidemia Father   . Hypertension Father        also kidney disease  . Heart disease Father        CABG, twice  . Diabetes Maternal Grandmother   . Colon cancer Maternal Grandfather   . Clotting disorder  Paternal Grandmother        blood disorder  . Heart disease Paternal Grandfather   . Stroke Brother   . Diabetes Brother   . Diabetes Brother   . Stroke Brother   . Breast cancer Maternal Aunt        x 4  . Colon cancer Maternal Uncle   . Von Willebrand disease Daughter   . Von Willebrand disease Daughter   . Hypertension Son   . Crohn's disease Grandchild   . Clotting disorder Grandchild        x3    SOCIAL HISTORY: Social History   Socioeconomic History  . Marital status: Married    Spouse name: Not on file  . Number of children: 3  . Years of education: Not on file  . Highest education level: Not on file  Occupational History  . Occupation: retired - Armed forces technical officer  Tobacco Use  . Smoking status:  Never Smoker  . Smokeless tobacco: Never Used  Vaping Use  . Vaping Use: Never used  Substance and Sexual Activity  . Alcohol use: No  . Drug use: No  . Sexual activity: Not Currently  Other Topics Concern  . Not on file  Social History Narrative   Married   1 son and 2 daughters, 10 grandchildren   Remains active   Social Determinants of Health   Financial Resource Strain:   . Difficulty of Paying Living Expenses:   Food Insecurity:   . Worried About Charity fundraiser in the Last Year:   . Arboriculturist in the Last Year:   Transportation Needs:   . Film/video editor (Medical):   Marland Kitchen Lack of Transportation (Non-Medical):   Physical Activity:   . Days of Exercise per Week:   . Minutes of Exercise per Session:   Stress:   . Feeling of Stress :   Social Connections:   . Frequency of Communication with Friends and Family:   . Frequency of Social Gatherings with Friends and Family:   . Attends Religious Services:   . Active Member of Clubs or Organizations:   . Attends Archivist Meetings:   Marland Kitchen Marital Status:   Intimate Partner Violence:   . Fear of Current or Ex-Partner:   . Emotionally Abused:   Marland Kitchen Physically Abused:   . Sexually Abused:     Allergies  Allergen Reactions  . Erythromycin Diarrhea  . Other Hives and Other (See Comments)    Allergen - EKG electrodes  . Statins Other (See Comments)    Makes her hurt bad  . Sulfa Antibiotics Hives, Nausea Only and Rash  . Tramadol Nausea And Vomiting and Other (See Comments)    medicine has her dizzy and nauseated   . Vytorin [Ezetimibe-Simvastatin] Hives  . Adhesive [Tape] Rash    Current Outpatient Medications  Medication Sig Dispense Refill  . alendronate (FOSAMAX) 70 MG tablet Take 70 mg by mouth every Sunday. Take with a full glass of water on an empty stomach.     . Calcium Carb-Cholecalciferol (CALCIUM+D3 PO) Take 1 tablet by mouth daily. 1200 mg (Calcium)-25 mcg (D3)    . chlordiazePOXIDE  (LIBRIUM) 10 MG capsule Take 10 mg by mouth 2 (two) times daily.     . Cholecalciferol (VITAMIN D3) 2000 UNITS TABS Take 2,000 Units by mouth at bedtime.     . Coenzyme Q10 200 MG capsule Take 200 mg by mouth daily.    Marland Kitchen CRANBERRY PO  Take 1 tablet by mouth daily.    . cyanocobalamin (,VITAMIN B-12,) 1000 MCG/ML injection Inject 1,000 mcg into the muscle every 30 (thirty) days.    . Flaxseed, Linseed, (FLAX SEED OIL) 1300 MG CAPS Take 1,300 mg by mouth daily. Omega 3    . furosemide (LASIX) 40 MG tablet Take 40 mg by mouth daily.     Marland Kitchen gabapentin (NEURONTIN) 600 MG tablet Take 600 mg by mouth 2 (two) times daily.     Javier Docker Oil 500 MG CAPS Take 500 mg by mouth daily.    Marland Kitchen levothyroxine (SYNTHROID, LEVOTHROID) 50 MCG tablet Take 50 mcg by mouth daily before breakfast.     . losartan-hydrochlorothiazide (HYZAAR) 100-25 MG per tablet Take 1 tablet by mouth daily.     . Multiple Vitamins-Minerals (PRESERVISION AREDS PO) Take 1 tablet by mouth 2 (two) times daily.    . OXYGEN Inhale 2 L into the lungs at bedtime.     . pantoprazole (PROTONIX) 40 MG tablet TAKE 1 TABLET BY MOUTH DAILY BEFORE BREAKFAST AND TAKE 1 TABLET DAILY BEFORE DINNER (Patient taking differently: Take 40 mg by mouth daily. ) 60 tablet 5  . Polyethyl Glycol-Propyl Glycol (SYSTANE) 0.4-0.3 % SOLN Place 1 drop into both eyes daily as needed (Dry eye).    . potassium chloride SA (K-DUR,KLOR-CON) 20 MEQ tablet Take 40 mEq by mouth 2 (two) times daily.     Marland Kitchen PRESCRIPTION MEDICATION Place 1 application vaginally 2 (two) times a week. Custom Compound  Estradiol 0.1mg /Ml Vb Cream - Vaginal PERL    . rosuvastatin (CRESTOR) 5 MG tablet Take 5 mg by mouth every Monday, Wednesday, and Friday. In the morning.    Marland Kitchen SAVELLA 100 MG TABS tablet Take 100 mg by mouth 2 (two) times daily.  2  . triamcinolone ointment (KENALOG) 0.1 % Apply 1 application topically daily as needed for itching.    . trimethoprim (TRIMPEX) 100 MG tablet Take 100 mg by  mouth daily.  1  . VENTOLIN HFA 108 (90 Base) MCG/ACT inhaler Inhale 2 puffs into the lungs every 6 (six) hours as needed.     No current facility-administered medications for this visit.    REVIEW OF SYSTEMS:  [X]  denotes positive finding, [ ]  denotes negative finding Cardiac  Comments:  Chest pain or chest pressure:    Shortness of breath upon exertion:    Short of breath when lying flat:    Irregular heart rhythm:        Vascular    Pain in calf, thigh, or hip brought on by ambulation: x   Pain in feet at night that wakes you up from your sleep:     Blood clot in your veins:    Leg swelling:  x       Pulmonary    Oxygen at home: x   Productive cough:     Wheezing:         Neurologic    Sudden weakness in arms or legs:     Sudden numbness in arms or legs:     Sudden onset of difficulty speaking or slurred speech:    Temporary loss of vision in one eye:     Problems with dizziness:         Gastrointestinal    Blood in stool:     Vomited blood:         Genitourinary    Burning when urinating:     Blood in urine:  Psychiatric    Major depression:         Hematologic    Bleeding problems:    Problems with blood clotting too easily:        Skin    Rashes or ulcers:        Constitutional    Fever or chills:     PHYSICAL EXAM:   Vitals:   11/26/19 1447  BP: 124/77  Pulse: (!) 105  Resp: 18  Temp: 97.9 F (36.6 C)  TempSrc: Temporal  SpO2: 97%  Weight: 178 lb 8 oz (81 kg)  Height: 5' (1.524 m)    GENERAL: The patient is a well-nourished female, in no acute distress. The vital signs are documented above. CARDIAC: There is a regular rate and rhythm.  VASCULAR: I do not detect carotid bruits. She has a palpable right dorsalis pedis pulse.  She has a biphasic dorsalis pedis and posterior tibial signal with the Doppler. She has a left posterior tibial pulse.  She has a biphasic dorsalis pedis and posterior tibial signal with Doppler. She has  hyperpigmentation bilaterally which is more significant on the left side.  This is documented below.        I did look at both great saphenous veins myself with the SonoSite.  She does have a patent great saphenous vein on the left.  I suspect that the previously ablated vein may have recanalized.  Great saphenous veins on either side are significantly dilated. PULMONARY: There is good air exchange bilaterally without wheezing or rales. ABDOMEN: Soft and non-tender with normal pitched bowel sounds.  MUSCULOSKELETAL: There are no major deformities or cyanosis. NEUROLOGIC: No focal weakness or paresthesias are detected. SKIN: There are no ulcers or rashes noted. PSYCHIATRIC: The patient has a normal affect.  DATA:    The patient did not have a venous study today because of scheduling issues.  A total of 45 minutes was spent on this visit. 25 minutes was face to face time. More than 50% of the time was spent on counseling and coordinating with the patient.

## 2019-11-27 ENCOUNTER — Encounter: Payer: Medicare Other | Admitting: Internal Medicine

## 2019-11-30 ENCOUNTER — Telehealth: Payer: Self-pay

## 2019-11-30 ENCOUNTER — Encounter (HOSPITAL_COMMUNITY): Payer: Self-pay | Admitting: Gastroenterology

## 2019-11-30 ENCOUNTER — Ambulatory Visit (HOSPITAL_COMMUNITY): Payer: Medicare Other | Admitting: Anesthesiology

## 2019-11-30 ENCOUNTER — Ambulatory Visit (HOSPITAL_COMMUNITY)
Admission: RE | Admit: 2019-11-30 | Discharge: 2019-11-30 | Disposition: A | Discharge status: Home / Self Care | Payer: Medicare Other | Attending: Gastroenterology | Admitting: Gastroenterology

## 2019-11-30 ENCOUNTER — Encounter (HOSPITAL_COMMUNITY)
Admission: RE | Disposition: A | Discharge status: Home / Self Care | Payer: Self-pay | Source: Home / Self Care | Attending: Gastroenterology

## 2019-11-30 ENCOUNTER — Other Ambulatory Visit: Payer: Self-pay

## 2019-11-30 DIAGNOSIS — H8109 Meniere's disease, unspecified ear: Secondary | ICD-10-CM | POA: Insufficient documentation

## 2019-11-30 DIAGNOSIS — K222 Esophageal obstruction: Secondary | ICD-10-CM | POA: Diagnosis not present

## 2019-11-30 DIAGNOSIS — Z8249 Family history of ischemic heart disease and other diseases of the circulatory system: Secondary | ICD-10-CM | POA: Diagnosis not present

## 2019-11-30 DIAGNOSIS — R1319 Other dysphagia: Secondary | ICD-10-CM

## 2019-11-30 DIAGNOSIS — E669 Obesity, unspecified: Secondary | ICD-10-CM | POA: Insufficient documentation

## 2019-11-30 DIAGNOSIS — K2100 Gastro-esophageal reflux disease with esophagitis, without bleeding: Secondary | ICD-10-CM | POA: Diagnosis not present

## 2019-11-30 DIAGNOSIS — G4730 Sleep apnea, unspecified: Secondary | ICD-10-CM | POA: Insufficient documentation

## 2019-11-30 DIAGNOSIS — I7300 Raynaud's syndrome without gangrene: Secondary | ICD-10-CM | POA: Insufficient documentation

## 2019-11-30 DIAGNOSIS — E063 Autoimmune thyroiditis: Secondary | ICD-10-CM | POA: Diagnosis not present

## 2019-11-30 DIAGNOSIS — I10 Essential (primary) hypertension: Secondary | ICD-10-CM | POA: Diagnosis not present

## 2019-11-30 DIAGNOSIS — Z885 Allergy status to narcotic agent status: Secondary | ICD-10-CM | POA: Insufficient documentation

## 2019-11-30 DIAGNOSIS — Z6834 Body mass index (BMI) 34.0-34.9, adult: Secondary | ICD-10-CM | POA: Diagnosis not present

## 2019-11-30 DIAGNOSIS — E785 Hyperlipidemia, unspecified: Secondary | ICD-10-CM | POA: Insufficient documentation

## 2019-11-30 DIAGNOSIS — Z881 Allergy status to other antibiotic agents status: Secondary | ICD-10-CM | POA: Diagnosis not present

## 2019-11-30 DIAGNOSIS — Z888 Allergy status to other drugs, medicaments and biological substances status: Secondary | ICD-10-CM | POA: Diagnosis not present

## 2019-11-30 DIAGNOSIS — Z79899 Other long term (current) drug therapy: Secondary | ICD-10-CM | POA: Insufficient documentation

## 2019-11-30 DIAGNOSIS — M797 Fibromyalgia: Secondary | ICD-10-CM | POA: Insufficient documentation

## 2019-11-30 DIAGNOSIS — Z882 Allergy status to sulfonamides status: Secondary | ICD-10-CM | POA: Insufficient documentation

## 2019-11-30 DIAGNOSIS — R1310 Dysphagia, unspecified: Secondary | ICD-10-CM | POA: Diagnosis not present

## 2019-11-30 DIAGNOSIS — K3189 Other diseases of stomach and duodenum: Secondary | ICD-10-CM | POA: Insufficient documentation

## 2019-11-30 DIAGNOSIS — G894 Chronic pain syndrome: Secondary | ICD-10-CM | POA: Diagnosis not present

## 2019-11-30 DIAGNOSIS — Z85828 Personal history of other malignant neoplasm of skin: Secondary | ICD-10-CM | POA: Insufficient documentation

## 2019-11-30 DIAGNOSIS — K449 Diaphragmatic hernia without obstruction or gangrene: Secondary | ICD-10-CM | POA: Diagnosis not present

## 2019-11-30 DIAGNOSIS — Z9981 Dependence on supplemental oxygen: Secondary | ICD-10-CM | POA: Insufficient documentation

## 2019-11-30 DIAGNOSIS — Z79890 Hormone replacement therapy (postmenopausal): Secondary | ICD-10-CM | POA: Diagnosis not present

## 2019-11-30 HISTORY — PX: BALLOON DILATION: SHX5330

## 2019-11-30 HISTORY — PX: ESOPHAGOGASTRODUODENOSCOPY (EGD) WITH PROPOFOL: SHX5813

## 2019-11-30 SURGERY — ESOPHAGOGASTRODUODENOSCOPY (EGD) WITH PROPOFOL
Anesthesia: Monitor Anesthesia Care

## 2019-11-30 MED ORDER — LIDOCAINE HCL (CARDIAC) PF 100 MG/5ML IV SOSY
PREFILLED_SYRINGE | INTRAVENOUS | Status: DC | PRN
Start: 2019-11-30 — End: 2019-11-30
  Administered 2019-11-30: 80 mg via INTRAVENOUS

## 2019-11-30 MED ORDER — LACTATED RINGERS IV SOLN: INTRAVENOUS | Status: DC

## 2019-11-30 MED ORDER — SODIUM CHLORIDE 0.9 % IV SOLN: INTRAVENOUS | Status: DC

## 2019-11-30 MED ORDER — PROPOFOL 500 MG/50ML IV EMUL
INTRAVENOUS | Status: DC | PRN
  Administered 2019-11-30: 125 ug/kg/min via INTRAVENOUS

## 2019-11-30 MED ORDER — LACTATED RINGERS IV SOLN
INTRAVENOUS | Status: AC | PRN
  Administered 2019-11-30: 1000 mL via INTRAVENOUS

## 2019-11-30 MED ORDER — PROPOFOL 500 MG/50ML IV EMUL
INTRAVENOUS | Status: DC | PRN
  Administered 2019-11-30: 30 mg via INTRAVENOUS

## 2019-11-30 SURGICAL SUPPLY — 14 items

## 2019-11-30 NOTE — Interval H&P Note (Signed)
History and Physical Interval Note:  11/30/2019 8:56 AM  Audrey [name]  has presented today for surgery, with the diagnosis of dysphagia, h. hernia.  The various methods of treatment have been discussed with the patient and family. After consideration of risks, benefits and other options for treatment, the patient has consented to  Procedure(s): ESOPHAGOGASTRODUODENOSCOPY (EGD) WITH PROPOFOL (N/A) as a surgical intervention.  The patient's history has been reviewed, patient examined, no change in status, stable for surgery.  I have reviewed the patient's chart and labs.  Questions were answered to the patient's satisfaction.      

## 2019-11-30 NOTE — Op Note (Signed)
[facility] Patient Name: Audrey [name] Procedure Date: 11/30/2019 MRN: [id number] Attending MD: Mauri Pole , MD Date of Birth: [birth year] CSN: [id number] Age: 79 Admit Type: Outpatient Procedure:                Upper GI endoscopy Indications:              Dysphagia Providers:                Mauri Pole, MD, Cleda Daub, RN, Cletis Athens, Technician, Arnoldo Hooker, CRNA Referring MD:              Medicines:                Monitored Anesthesia Care Complications:            No immediate complications. Estimated Blood Loss:     Estimated blood loss was minimal. Procedure:                Pre-Anesthesia Assessment:                           - Prior to the procedure, a History and Physical                            was performed, and patient medications and                            allergies were reviewed. The patient's tolerance of                            previous anesthesia was also reviewed. The risks                            and benefits of the procedure and the sedation                            options and risks were discussed with the patient.                            All questions were answered, and informed consent                            was obtained. Prior Anticoagulants: The patient has                            taken no previous anticoagulant or antiplatelet                            agents. ASA Grade Assessment: III - A patient with                            severe systemic disease. After reviewing the risks  and benefits, the patient was deemed in                            satisfactory condition to undergo the procedure.                           After obtaining informed consent, the endoscope was                            passed under direct vision. Throughout the                            procedure, the patient's blood pressure, pulse, and                            oxygen  saturations were monitored continuously. The                            GIF-H190 ([id number]) Olympus gastroscope was                            introduced through the mouth, and advanced to the                            second part of duodenum. The upper GI endoscopy was                            accomplished without difficulty. The patient                            tolerated the procedure well. Scope In: Scope Out: Findings:      LA Grade B (one or more mucosal breaks greater than 5 mm, not extending       between the tops of two mucosal folds) esophagitis with no bleeding was       found 29 to 30 cm from the incisors.      One benign-appearing, intrinsic mild (non-circumferential scarring)       stenosis was found 30 to 31 cm from the incisors. This stenosis measured       less than one cm (in length). The stenosis was traversed. A TTS dilator       was passed through the scope. Dilation with a 15-16.5-18 mm x 8 cm CRE       balloon dilator was performed to 18 mm. The dilation site was examined       and showed no change.      A 5 cm hiatal hernia was present.      Striped mildly erythematous mucosa without bleeding was found in the       gastric antrum.      The examined duodenum was normal. Impression:               - LA Grade B reflux esophagitis with no bleeding.                           - Benign-appearing esophageal stenosis. Dilated.                           -  5 cm hiatal hernia.                           - Erythematous mucosa in the antrum.                           - Normal examined duodenum.                           - No specimens collected. Moderate Sedation:      Not Applicable - Patient had care per Anesthesia. Recommendation:           - Patient has a contact number available for                            emergencies. The signs and symptoms of potential                            delayed complications were discussed with the                            patient.  Return to normal activities tomorrow.                            Written discharge instructions were provided to the                            patient.                           - Resume previous diet.                           - Continue present medications.                           - Follow an antireflux regimen.                           - Use Protonix (pantoprazole) 40 mg PO BID.                           - Return to GI office Dr Carlean Purl at appointment to                            be scheduled in 6-8 weeks. Procedure Code(s):        --- Professional ---                           [phone], Esophagogastroduodenoscopy, flexible,                            transoral; with transendoscopic balloon dilation of                            esophagus (less than 30 mm diameter) Diagnosis Code(s):        ---  Professional ---                           K21.00, Gastro-esophageal reflux disease with                            esophagitis, without bleeding                           K22.2, Esophageal obstruction                           K44.9, Diaphragmatic hernia without obstruction or                            gangrene                           K31.89, Other diseases of stomach and duodenum                           R13.10, Dysphagia, unspecified CPT copyright 2019 American Medical Association. All rights reserved. The codes documented in this report are preliminary and upon coder review may  be revised to meet current compliance requirements. Mauri Pole, MD 11/30/2019 10:55:17 AM This report has been signed electronically. Number of Addenda: 0

## 2019-11-30 NOTE — Telephone Encounter (Signed)
Called patient and gave EGD results. Let her know Dr. Silverio Decamp wants her to take her Protonix BID before meals. Scheduled office visit with Dr. Carlean Purl on 01/29/20

## 2019-11-30 NOTE — Anesthesia Preprocedure Evaluation (Signed)
Anesthesia Evaluation  Patient identified by MRN, date of birth, ID band Patient awake    Reviewed: Allergy & Precautions, NPO status , Patient's Chart, lab work & pertinent test results  History of Anesthesia Complications (+) PONV and history of anesthetic complications  Airway Mallampati: II  TM Distance: >3 FB Neck ROM: Full    Dental no notable dental hx.    Pulmonary neg pulmonary ROS, sleep apnea ,    Pulmonary exam normal        Cardiovascular hypertension, Pt. on medications negative cardio ROS Normal cardiovascular exam     Neuro/Psych PSYCHIATRIC DISORDERS Anxiety Depression  Neuromuscular disease    GI/Hepatic negative GI ROS, Neg liver ROS, GERD  Medicated,  Endo/Other  Hypothyroidism   Renal/GU negative Renal ROS  negative genitourinary   Musculoskeletal  (+) Arthritis , Fibromyalgia -  Abdominal (+) + obese,   Peds negative pediatric ROS (+)  Hematology negative hematology ROS (+)   Anesthesia Other Findings   Reproductive/Obstetrics negative OB ROS                             Anesthesia Physical  Anesthesia Plan  ASA: II  Anesthesia Plan: MAC   Post-op Pain Management:    Induction:   PONV Risk Score and Plan: 3 and Treatment may vary due to age or medical condition and Ondansetron  Airway Management Planned: Simple Face Mask and Natural Airway  Additional Equipment: None  Intra-op Plan:   Post-operative Plan:   Informed Consent: I have reviewed the patients History and Physical, chart, labs and discussed the procedure including the risks, benefits and alternatives for the proposed anesthesia with the patient or authorized representative who has indicated his/her understanding and acceptance.     Dental advisory given  Plan Discussed with: CRNA  Anesthesia Plan Comments:         Anesthesia Quick Evaluation

## 2019-11-30 NOTE — Transfer of Care (Signed)
Immediate Anesthesia Transfer of Care Note  Patient: Audrey [name]  Procedure(s) Performed: Procedure(s): ESOPHAGOGASTRODUODENOSCOPY (EGD) WITH PROPOFOL (N/A) BALLOON DILATION (N/A)  Patient Location: PACU  Anesthesia Type:MAC  Level of Consciousness:  sedated, patient cooperative and responds to stimulation  Airway & Oxygen Therapy:Patient Spontanous Breathing and Patient connected to face mask oxgen  Post-op Assessment:  Report given to PACU RN and Post -op Vital signs reviewed and stable  Post vital signs:  Reviewed and stable  Last Vitals:  Vitals:   11/30/19 0849  BP: 120/78  Pulse: 94  Resp: (!) 22  Temp: 36.5 C  SpO2: 161%    Complications: No apparent anesthesia complications

## 2019-11-30 NOTE — Discharge Instructions (Signed)
YOU HAD AN ENDOSCOPIC PROCEDURE TODAY: Refer to the procedure report and other information in the discharge instructions given to you for any specific questions about what was found during the examination. If this information does not answer your questions, please call [place] office at [phone] to clarify.  ° °YOU SHOULD EXPECT: Some feelings of bloating in the abdomen. Passage of more gas than usual. Walking can help get rid of the air that was put into your GI tract during the procedure and reduce the bloating. If you had a lower endoscopy (such as a colonoscopy or flexible sigmoidoscopy) you may notice spotting of blood in your stool or on the toilet paper. Some abdominal soreness may be present for a day or two, also. ° °DIET: Your first meal following the procedure should be a light meal and then it is ok to progress to your normal diet. A half-sandwich or bowl of soup is an example of a good first meal. Heavy or fried foods are harder to digest and may make you feel nauseous or bloated. Drink plenty of fluids but you should avoid alcoholic beverages for 24 hours. If you had a esophageal dilation, please see attached instructions for diet.   ° °ACTIVITY: Your care partner should take you home directly after the procedure. You should plan to take it easy, moving slowly for the rest of the day. You can resume normal activity the day after the procedure however YOU SHOULD NOT DRIVE, use power tools, machinery or perform tasks that involve climbing or major physical exertion for 24 hours (because of the sedation medicines used during the test).  ° °SYMPTOMS TO REPORT IMMEDIATELY: °A gastroenterologist can be reached at any hour. Please call [phone]  for any of the following symptoms:  °Following lower endoscopy (colonoscopy, flexible sigmoidoscopy) °Excessive amounts of blood in the stool  °Significant tenderness, worsening of abdominal pains  °Swelling of the abdomen that is new, acute  °Fever of 100° or  higher  °Following upper endoscopy (EGD, EUS, ERCP, esophageal dilation) °Vomiting of blood or coffee ground material  °New, significant abdominal pain  °New, significant chest pain or pain under the shoulder blades  °Painful or persistently difficult swallowing  °New shortness of breath  °Black, tarry-looking or red, bloody stools ° °FOLLOW UP:  °If any biopsies were taken you will be contacted by phone or by letter within the next 1-3 weeks. Call [phone]  if you have not heard about the biopsies in 3 weeks.  °Please also call with any specific questions about appointments or follow up tests. ° °

## 2019-11-30 NOTE — Anesthesia Postprocedure Evaluation (Signed)
Anesthesia Post Note  Patient: Audrey [name]  Procedure(s) Performed: ESOPHAGOGASTRODUODENOSCOPY (EGD) WITH PROPOFOL (N/A ) BALLOON DILATION (N/A )     Patient location during evaluation: Phase II Anesthesia Type: MAC Level of consciousness: awake Pain management: pain level controlled Vital Signs Assessment: post-procedure vital signs reviewed and stable Respiratory status: spontaneous breathing Cardiovascular status: stable Postop Assessment: no apparent nausea or vomiting Anesthetic complications: no   No complications documented.  Last Vitals:  Vitals:   11/30/19 1100 11/30/19 1110  BP: (!) 106/42 (!) 114/45  Pulse: 87 87  Resp: 17 18  Temp:    SpO2: 100% 99%    Last Pain:  Vitals:   11/30/19 1110  TempSrc:   PainSc: 0-No pain   Pain Goal:                   Huston Foley

## 2019-11-30 NOTE — Telephone Encounter (Signed)
-----   Message from Mauri Pole, MD sent at 11/30/2019 10:56 AM EDT ----- She has erosive esophagitis LA grade B and hiatal hernia. Mild stricture dilated with 65mm balloon.  Advised her to take Protonix BID as prescribed, she has been taking it only once daily. Will need office follow up visit next available appt in 6-8 weeks Thank you VN

## 2019-12-01 ENCOUNTER — Encounter (HOSPITAL_COMMUNITY): Payer: Self-pay | Admitting: Gastroenterology

## 2019-12-09 DIAGNOSIS — D519 Vitamin B12 deficiency anemia, unspecified: Secondary | ICD-10-CM | POA: Diagnosis not present

## 2019-12-09 DIAGNOSIS — N342 Other urethritis: Secondary | ICD-10-CM | POA: Diagnosis not present

## 2019-12-09 DIAGNOSIS — N390 Urinary tract infection, site not specified: Secondary | ICD-10-CM | POA: Diagnosis not present

## 2019-12-09 DIAGNOSIS — E6609 Other obesity due to excess calories: Secondary | ICD-10-CM | POA: Diagnosis not present

## 2019-12-09 DIAGNOSIS — Z6833 Body mass index (BMI) 33.0-33.9, adult: Secondary | ICD-10-CM | POA: Diagnosis not present

## 2019-12-16 DIAGNOSIS — H02052 Trichiasis without entropian right lower eyelid: Secondary | ICD-10-CM | POA: Diagnosis not present

## 2019-12-16 DIAGNOSIS — H02832 Dermatochalasis of right lower eyelid: Secondary | ICD-10-CM | POA: Diagnosis not present

## 2019-12-16 DIAGNOSIS — H04123 Dry eye syndrome of bilateral lacrimal glands: Secondary | ICD-10-CM | POA: Diagnosis not present

## 2019-12-16 DIAGNOSIS — H0102A Squamous blepharitis right eye, upper and lower eyelids: Secondary | ICD-10-CM | POA: Diagnosis not present

## 2019-12-16 DIAGNOSIS — Z961 Presence of intraocular lens: Secondary | ICD-10-CM | POA: Diagnosis not present

## 2019-12-16 DIAGNOSIS — H02835 Dermatochalasis of left lower eyelid: Secondary | ICD-10-CM | POA: Diagnosis not present

## 2019-12-16 DIAGNOSIS — H0102B Squamous blepharitis left eye, upper and lower eyelids: Secondary | ICD-10-CM | POA: Diagnosis not present

## 2019-12-16 DIAGNOSIS — H02055 Trichiasis without entropian left lower eyelid: Secondary | ICD-10-CM | POA: Diagnosis not present

## 2019-12-16 DIAGNOSIS — H353131 Nonexudative age-related macular degeneration, bilateral, early dry stage: Secondary | ICD-10-CM | POA: Diagnosis not present

## 2019-12-18 DIAGNOSIS — E7849 Other hyperlipidemia: Secondary | ICD-10-CM | POA: Diagnosis not present

## 2019-12-18 DIAGNOSIS — I10 Essential (primary) hypertension: Secondary | ICD-10-CM | POA: Diagnosis not present

## 2019-12-18 DIAGNOSIS — E063 Autoimmune thyroiditis: Secondary | ICD-10-CM | POA: Diagnosis not present

## 2019-12-23 DIAGNOSIS — Z6833 Body mass index (BMI) 33.0-33.9, adult: Secondary | ICD-10-CM | POA: Diagnosis not present

## 2019-12-23 DIAGNOSIS — E6609 Other obesity due to excess calories: Secondary | ICD-10-CM | POA: Diagnosis not present

## 2019-12-23 DIAGNOSIS — F419 Anxiety disorder, unspecified: Secondary | ICD-10-CM | POA: Diagnosis not present

## 2019-12-23 DIAGNOSIS — G250 Essential tremor: Secondary | ICD-10-CM | POA: Diagnosis not present

## 2020-01-19 DIAGNOSIS — I10 Essential (primary) hypertension: Secondary | ICD-10-CM | POA: Diagnosis not present

## 2020-01-19 DIAGNOSIS — E063 Autoimmune thyroiditis: Secondary | ICD-10-CM | POA: Diagnosis not present

## 2020-01-19 DIAGNOSIS — E7849 Other hyperlipidemia: Secondary | ICD-10-CM | POA: Diagnosis not present

## 2020-01-20 DIAGNOSIS — N390 Urinary tract infection, site not specified: Secondary | ICD-10-CM | POA: Diagnosis not present

## 2020-01-20 DIAGNOSIS — E6609 Other obesity due to excess calories: Secondary | ICD-10-CM | POA: Diagnosis not present

## 2020-01-20 DIAGNOSIS — N342 Other urethritis: Secondary | ICD-10-CM | POA: Diagnosis not present

## 2020-01-20 DIAGNOSIS — Z6833 Body mass index (BMI) 33.0-33.9, adult: Secondary | ICD-10-CM | POA: Diagnosis not present

## 2020-01-22 DIAGNOSIS — L905 Scar conditions and fibrosis of skin: Secondary | ICD-10-CM | POA: Diagnosis not present

## 2020-01-22 DIAGNOSIS — L304 Erythema intertrigo: Secondary | ICD-10-CM | POA: Diagnosis not present

## 2020-01-22 DIAGNOSIS — L814 Other melanin hyperpigmentation: Secondary | ICD-10-CM | POA: Diagnosis not present

## 2020-01-22 DIAGNOSIS — L821 Other seborrheic keratosis: Secondary | ICD-10-CM | POA: Diagnosis not present

## 2020-01-22 DIAGNOSIS — Z85828 Personal history of other malignant neoplasm of skin: Secondary | ICD-10-CM | POA: Diagnosis not present

## 2020-01-22 DIAGNOSIS — D1801 Hemangioma of skin and subcutaneous tissue: Secondary | ICD-10-CM | POA: Diagnosis not present

## 2020-01-22 DIAGNOSIS — L570 Actinic keratosis: Secondary | ICD-10-CM | POA: Diagnosis not present

## 2020-01-22 DIAGNOSIS — D225 Melanocytic nevi of trunk: Secondary | ICD-10-CM | POA: Diagnosis not present

## 2020-01-27 DIAGNOSIS — D519 Vitamin B12 deficiency anemia, unspecified: Secondary | ICD-10-CM | POA: Diagnosis not present

## 2020-01-29 ENCOUNTER — Ambulatory Visit: Payer: Medicare Other | Admitting: Internal Medicine

## 2020-02-16 DIAGNOSIS — N3281 Overactive bladder: Secondary | ICD-10-CM | POA: Diagnosis not present

## 2020-02-16 DIAGNOSIS — N393 Stress incontinence (female) (male): Secondary | ICD-10-CM | POA: Diagnosis not present

## 2020-02-16 DIAGNOSIS — N390 Urinary tract infection, site not specified: Secondary | ICD-10-CM | POA: Diagnosis not present

## 2020-02-17 ENCOUNTER — Other Ambulatory Visit: Payer: Self-pay

## 2020-02-17 ENCOUNTER — Encounter: Payer: Self-pay | Admitting: Primary Care

## 2020-02-17 ENCOUNTER — Ambulatory Visit (INDEPENDENT_AMBULATORY_CARE_PROVIDER_SITE_OTHER): Payer: Medicare Other | Admitting: Primary Care

## 2020-02-17 VITALS — BP 118/78 | HR 86 | Temp 97.4°F | Ht 60.0 in | Wt 170.8 lb

## 2020-02-17 DIAGNOSIS — Z23 Encounter for immunization: Secondary | ICD-10-CM

## 2020-02-17 DIAGNOSIS — G4734 Idiopathic sleep related nonobstructive alveolar hypoventilation: Secondary | ICD-10-CM

## 2020-02-17 DIAGNOSIS — G4733 Obstructive sleep apnea (adult) (pediatric): Secondary | ICD-10-CM

## 2020-02-17 NOTE — Progress Notes (Signed)
@Patient  ID: Audrey [name], female    DOB: May 01, 1941, 79 y.o.   MRN: [id number]  Chief Complaint  Patient presents with  . Follow-up    Pt states she has been doing okay since last visit. Denies any complaints with her breathing. Pt does wear 2L O2 at night. Pt is requesting to have a smaller concentrator for her to take when she travels as the one that she currently has is too heavy for her to take with her to be able to use at night. DME: [facility]    Referring provider: Redmond School, MD  HPI: 79 year old female, never smoked. PMH significant for mild OSA, nocturnal hypoxemia. Patient of Dr. Elsworth Soho, last seen by pulmonary NP ON 12/10/18 for oxygen re-qualification.   Previous [facility] pulmonary encounters:  12/10/2018 OV for oxygen qualification. Pt wears oxygen at bedtime for nocturnal hypoxemia. She states she is compliant with her oxygen therapy every night. She is here per her DME to re qualify for her nocturnal oxygen. Per her insurance and DME requirements, the patient has been told she must have reassessment for continued therapy.She states she is doing well. She has no issues with Her night time oxygen. She states she does have some early morning sleepiness but it clears as soon as she wakes up. She states if she falls asleep without her oxygen she wakes up and her body feels heavy.She is wearing her compression stockings every day for lower extremity edema, and she is compliant with her daily lasix. .She denies fever, chest pain, orthopnea or hemoptysis. She does not need any oxygen supplies at present.  02/17/2020- Interim hx Patient presents today to re-qualify for nocturnal oxygen. She is doing well, no acute complaints. She has occasional wheezing during the day and late evenings. She was given prescription for Ventolin. She has not needed to use this in a long time, she carries it with her just in care. She is sleeping very well at night. No nocturnal respiratory symptoms. She  has GERD symptoms, takes Protonix twice daily. She wears 2L oxygen at night. Needs smaller concentrator for nocturnal oxygen for travel. She goes to beach several times a year with family and home concentrator is too heavy for her to carry. DME company is [facility]. Denies daytime fatigue.    Test Results: Sleep Study 12/2017 AHI was 6.4 Nocturnal desaturations 40 desaturations were recorded. Nadir was 81 % Diagnosis is very mild OSA with hypopneas Mild oxygen desaturations noted without respiratory events. Recommendations Dr. Elsworth Soho: Treatment options for this degree of sleep disordered breathing incluse positional therapy and weight loss alone. CPAP can be considered if the patient is very symptomatic. Consider evaluation of underlying cardiopulmonary disease as cause for oxygen desaturations.   Allergies  Allergen Reactions  . Erythromycin Diarrhea  . Other Hives and Other (See Comments)    Allergen - EKG electrodes  . Statins Other (See Comments)    Makes her hurt bad  . Sulfa Antibiotics Hives, Nausea Only and Rash  . Tramadol Nausea And Vomiting and Other (See Comments)    medicine has her dizzy and nauseated   . Vytorin [Ezetimibe-Simvastatin] Hives  . Adhesive [Tape] Rash    Immunization History  Administered Date(s) Administered  . Fluad Quad(high Dose 65+) 02/17/2020  . Influenza, High Dose Seasonal PF 01/01/2015  . PFIZER SARS-COV-2 Vaccination 06/22/2019, 07/13/2019  . Tdap 07/14/2010    Past Medical History:  Diagnosis Date  . Allergic rhinitis   . Anxiety and  depression   . Arthritis   . Basal cell carcinoma 2002  . Chronic lymphocytic thyroiditis   . Chronic pain syndrome   . Colon polyp   . Depression   . Diverticulosis   . Dyspnea    with excertion  . Family history of heart disease   . Fibromyalgia   . Gastric polyps   . GERD (gastroesophageal reflux disease)   . Hashimoto's disease   . Hiatal hernia   . History of nuclear stress test  04/2010   nonischemic   . HLD (hyperlipidemia)   . HTN (hypertension)   . Hypothyroidism   . Interstitial cystitis   . Meniere's disease   . Obesity   . Pinched vertebral nerve   . PONV (postoperative nausea and vomiting)   . Raynaud's syndrome   . Right hip pain 08/18/2012  . Sleep apnea   . Tuberculosis    postive skin test no symptoms  was around mom that had it  . Vitamin B12 deficiency     Tobacco History: Social History   Tobacco Use  Smoking Status Never Smoker  Smokeless Tobacco Never Used   Counseling given: Not Answered   Outpatient Medications Prior to Visit  Medication Sig Dispense Refill  . alendronate (FOSAMAX) 70 MG tablet Take 70 mg by mouth every Sunday. Take with a full glass of water on an empty stomach.     . Calcium Carb-Cholecalciferol (CALCIUM+D3 PO) Take 1 tablet by mouth daily. 1200 mg (Calcium)-25 mcg (D3)    . chlordiazePOXIDE (LIBRIUM) 10 MG capsule Take 10 mg by mouth 2 (two) times daily.     . Cholecalciferol (VITAMIN D3) 2000 UNITS TABS Take 2,000 Units by mouth at bedtime.     . Coenzyme Q10 200 MG capsule Take 200 mg by mouth daily.    Marland Kitchen CRANBERRY PO Take 1 tablet by mouth daily.    . cyanocobalamin (,VITAMIN B-12,) 1000 MCG/ML injection Inject 1,000 mcg into the muscle every 30 (thirty) days.    . Flaxseed, Linseed, (FLAX SEED OIL) 1300 MG CAPS Take 1,300 mg by mouth daily. Omega 3    . furosemide (LASIX) 40 MG tablet Take 40 mg by mouth daily.     Marland Kitchen gabapentin (NEURONTIN) 600 MG tablet Take 600 mg by mouth 2 (two) times daily.     Javier Docker Oil 500 MG CAPS Take 500 mg by mouth daily.    Marland Kitchen levothyroxine (SYNTHROID, LEVOTHROID) 50 MCG tablet Take 50 mcg by mouth daily before breakfast.     . losartan-hydrochlorothiazide (HYZAAR) 100-25 MG per tablet Take 1 tablet by mouth daily.     . Multiple Vitamins-Minerals (PRESERVISION AREDS PO) Take 1 tablet by mouth 2 (two) times daily.    . OXYGEN Inhale 2 L into the lungs at bedtime.     .  pantoprazole (PROTONIX) 40 MG tablet TAKE 1 TABLET BY MOUTH DAILY BEFORE BREAKFAST AND TAKE 1 TABLET DAILY BEFORE DINNER (Patient taking differently: Take 40 mg by mouth daily. ) 60 tablet 5  . Polyethyl Glycol-Propyl Glycol (SYSTANE) 0.4-0.3 % SOLN Place 1 drop into both eyes daily as needed (Dry eye).    . potassium chloride SA (K-DUR,KLOR-CON) 20 MEQ tablet Take 40 mEq by mouth 2 (two) times daily.     Marland Kitchen PRESCRIPTION MEDICATION Place 1 application vaginally 2 (two) times a week. Custom Compound  Estradiol 0.1mg /Ml Vb Cream - Vaginal PERL    . rosuvastatin (CRESTOR) 5 MG tablet Take 5 mg by mouth every Monday,  Wednesday, and Friday. In the morning.    Marland Kitchen SAVELLA 100 MG TABS tablet Take 100 mg by mouth 2 (two) times daily.  2  . triamcinolone ointment (KENALOG) 0.1 % Apply 1 application topically daily as needed for itching.    . trimethoprim (TRIMPEX) 100 MG tablet Take 100 mg by mouth daily.  1  . VENTOLIN HFA 108 (90 Base) MCG/ACT inhaler Inhale 2 puffs into the lungs every 6 (six) hours as needed.     No facility-administered medications prior to visit.   Review of Systems  Review of Systems  Constitutional: Negative.   HENT: Negative.   Respiratory: Negative for apnea, cough and shortness of breath.   Cardiovascular: Negative.     Physical Exam  BP 118/78 (BP Location: Left Arm, Cuff Size: Normal)   Pulse 86   Temp (!) 97.4 F (36.3 C) (Other (Comment)) Comment (Src): wrist  Ht 5' (1.524 m)   Wt 170 lb 12.8 oz (77.5 kg)   SpO2 99%   BMI 33.36 kg/m  Physical Exam Constitutional:      Appearance: Normal appearance.  HENT:     Mouth/Throat:     Comments: Deferred d/t masking Cardiovascular:     Rate and Rhythm: Normal rate.  Musculoskeletal:        General: Normal range of motion.  Skin:    General: Skin is warm and dry.  Neurological:     Mental Status: She is alert.  Psychiatric:        Mood and Affect: Mood normal.        Behavior: Behavior normal.         Thought Content: Thought content normal.        Judgment: Judgment normal.      Lab Results:  CBC    Component Value Date/Time   WBC 6.4 11/08/2017 1307   RBC 4.01 11/08/2017 1307   HGB 12.0 11/08/2017 1307   HCT 38.8 11/08/2017 1307   PLT 236 11/08/2017 1307   MCV 96.8 11/08/2017 1307   MCH 29.9 11/08/2017 1307   MCHC 30.9 11/08/2017 1307   RDW 14.9 11/08/2017 1307   LYMPHSABS 1.9 11/08/2017 1307   MONOABS 0.6 11/08/2017 1307   EOSABS 0.2 11/08/2017 1307   BASOSABS 0.0 11/08/2017 1307    BMET    Component Value Date/Time   NA 140 11/08/2017 1307   K 4.0 11/08/2017 1307   CL 107 11/08/2017 1307   CO2 26 11/08/2017 1307   GLUCOSE 87 11/08/2017 1307   GLUCOSE 97 04/03/2006 1626   BUN 25 (H) 11/08/2017 1307   CREATININE 0.79 11/08/2017 1307   CALCIUM 9.2 11/08/2017 1307   GFRNONAA >60 11/08/2017 1307   GFRAA >60 11/08/2017 1307    BNP No results found for: BNP  ProBNP No results found for: PROBNP  Imaging: No results found.   Assessment & Plan:   Nocturnal hypoxemia - Patient is compliant with nocturnal oxygen and continues to benefit from use - She needs smaller concentrator for travel use - Sending in prescription for Innogen concentrator at 2L continuous oxygen for nocturnal use - Follow-up in 1 year or sooner if needed  OSA (obstructive sleep apnea) - Mild OSA, AHI was 6.4. Not on CPAP therapy - No nocturnal symptoms    Martyn Ehrich, NP 02/17/2020

## 2020-02-17 NOTE — Assessment & Plan Note (Addendum)
-   Patient is compliant with nocturnal oxygen and continues to benefit from use - She needs smaller concentrator for travel use - Sending in prescription for Innogen concentrator at 2L continuous oxygen for nocturnal use - Follow-up in 1 year or sooner if needed

## 2020-02-17 NOTE — Assessment & Plan Note (Signed)
-   Mild OSA, AHI was 6.4. Not on CPAP therapy - No nocturnal symptoms

## 2020-02-17 NOTE — Patient Instructions (Addendum)
Recommendations: - Continue to use compression stockings during the day - Try using Aquaphor or Lubriderm at night/massage legs to help blood flow   Orders: - RX Innogen concentrator 2L continuous oxygen at night  Follow-up: - 1 year with Dr. Elsworth Soho or sooner if needed     Chronic Respiratory Failure  Respiratory failure is a condition in which the lungs do not work well and the breathing (respiratory) system fails. When respiratory failure occurs, it becomes difficult for the lungs to get enough oxygen or to eliminate carbon dioxide or to do both duties. If the lungs do not work properly, the heart, brain, and other body systems do not get enough oxygen. Respiratory failure is life-threatening if it is not treated. Respiratory failure can be acute or chronic. Acute respiratory failure is sudden and severe and requires emergency medical treatment. Chronic respiratory failure happens over time, usually due to a medical condition that gets worse. What are the causes? This condition may be caused by any problem that affects the heart or lungs. Causes include:  Chronic bronchitis and emphysema (COPD).  Pulmonary fibrosis.  Water in the lungs due to heart failure, lung injury, or infection (pulmonary edema).  Asthma.  Nerve or muscle diseases that make chest movements difficult, such as Leta Baptist disease or Guillain-Barre syndrome.  A collapsed lung (pneumothorax).  Pulmonary hypertension.  Chronic sleep apnea.  Pneumonia.  Obesity.  A blood clot in a lung (pulmonary embolism).  Trauma to the chest that makes breathing difficult. What increases the risk? You are more likely to develop this condition if:  You are a smoker, or have a history of smoking.  You have a weak immune system.  You have a family history of breathing problems or lung disease.  You have a long term lung disease such as COPD. What are the signs or symptoms? Symptoms of this condition  include:  Shortness of breath with or without activity.  Difficulty breathing.  Wheezing.  A fast or irregular heartbeat (arrhythmia).  Chest pain or tightness.  A bluish color to the fingernail or toenail beds (cyanosis).  Confusion.  Drowsiness.  Extreme fatigue, especially with minimal activity. How is this diagnosed? This condition may be diagnosed based on:  Your medical history.  A physical exam.  Other tests, such as: ? A chest X-ray. ? A CT scan of your lungs. ? Blood tests, such as an arterial blood gas test. This test is done to check if you have enough oxygen in your blood. ? An electrocardiogram. This test records the electrical activity of your heart. ? An echocardiogram. This test uses sound waves to produce an image of your heart.  A check of your blood pressure, heart rate, breathing rate, and blood oxygen level. How is this treated? Treatment for this condition depends on the cause. Treatment can include the following:  Getting oxygen through a nasal cannula. This is a tube that goes in your nose.  Getting oxygen through a face mask.  Receiving noninvasive positive pressure ventilation. This is a method of breathing support in which a machine blows air into your lungs through a mask. The machine allows you to breathe on your own. It helps the body take in oxygen and eliminate carbon dioxide.  Using a ventilator. This is a breathing machine that delivers oxygen to the lungs through a breathing tube that is put into the trachea. This machine is used when you can no longer breathe well enough on your own.  Medicines to  help with breathing, such as: ? Medicines that open up and relax air passages, such as bronchodilators. These may be given through a device that turns liquid medicines into a mist you can breathe in (nebulizer). These medicines help with breathing. ? Diuretics. These medicines get rid of extra fluid out of your lungs, which can help you  breathe better. ? Steroid medicines. These decrease inflammation in the lungs. ? Antibiotic medicines. These may be given to treat a bacterial infection, such as pneumonia.  Pulmonary rehabilitation. This is an exercise program that strengthens the muscles in your chest and helps you learn breathing techniques in order to manage your condition. Follow these instructions at home: Medicines  Take over-the-counter and prescription medicines only as told by your health care provider.  If you were prescribed an antibiotic medicine, take it as told by your health care provider. Do not stop taking the antibiotic even if you start to feel better. General instructions  Use oxygen therapy and pulmonary rehabilitation if directed to by your health care provider. If you require home oxygen therapy, ask your health care provider whether you should purchase a pulse oximeter to measure your oxygen level at home.  Work with your health care provider to create a plan to help you deal with your condition. Follow this plan.  Do not use any products that contain nicotine or tobacco, such as cigarettes and e-cigarettes. If you need help quitting, ask your health care provider.  Avoid exposure to irritants that make your breathing problems worse. These include smoke, chemicals, and fumes.  Stay active, but balance activity with periods of rest. Exercise and physical activity will help you maintain your ability to do things you want to do.  Stay up to date on all vaccines, especially yearly influenza and pneumonia vaccines.  Avoid people who are sick as well as crowded places during the flu season.  Keep all follow-up visits as told by your health care provider. This is important. Contact a health care provider if:  Your shortness of breath gets worse and you cannot do the things you used to do.  You have increased mucus (sputum), wheezing, coughing, or loss of energy.  You are on oxygen therapy and you  are starting to need more.  You need to use your medicines more often.  You have a fever. Get help right away if:  Your shortness of breath becomes worse.  You are unable to say more than a few words without having to catch your breath.  You develop chest pain or tightness. Summary  Respiratory failure is a condition in which the lungs do not work well and the breathing system fails.  This condition can be very serious and is often life-threatening.  This condition is diagnosed with tests and can be treated with medicines or oxygen.  Contact a health care provider if your shortness of breath gets worse or if you need to use your oxygen or medicines more often than before. This information is not intended to replace advice given to you by your health care provider. Make sure you discuss any questions you have with your health care provider. Document Revised: 04/19/2017 Document Reviewed: 05/18/2016 Elsevier Patient Education  2020 [employer].

## 2020-02-18 DIAGNOSIS — I10 Essential (primary) hypertension: Secondary | ICD-10-CM | POA: Diagnosis not present

## 2020-02-18 DIAGNOSIS — E063 Autoimmune thyroiditis: Secondary | ICD-10-CM | POA: Diagnosis not present

## 2020-02-18 DIAGNOSIS — E7849 Other hyperlipidemia: Secondary | ICD-10-CM | POA: Diagnosis not present

## 2020-03-01 DIAGNOSIS — H35033 Hypertensive retinopathy, bilateral: Secondary | ICD-10-CM | POA: Diagnosis not present

## 2020-03-01 DIAGNOSIS — H353132 Nonexudative age-related macular degeneration, bilateral, intermediate dry stage: Secondary | ICD-10-CM | POA: Diagnosis not present

## 2020-03-01 DIAGNOSIS — H43813 Vitreous degeneration, bilateral: Secondary | ICD-10-CM | POA: Diagnosis not present

## 2020-03-01 DIAGNOSIS — Z961 Presence of intraocular lens: Secondary | ICD-10-CM | POA: Diagnosis not present

## 2020-03-01 DIAGNOSIS — H00032 Abscess of right lower eyelid: Secondary | ICD-10-CM | POA: Diagnosis not present

## 2020-03-01 DIAGNOSIS — H209 Unspecified iridocyclitis: Secondary | ICD-10-CM | POA: Diagnosis not present

## 2020-03-03 ENCOUNTER — Ambulatory Visit: Payer: Medicare Other | Attending: Internal Medicine

## 2020-03-03 DIAGNOSIS — Z23 Encounter for immunization: Secondary | ICD-10-CM

## 2020-03-03 NOTE — Progress Notes (Signed)
   Covid-19 Vaccination Clinic  Name:  Audrey [name]    MRN: [id number] DOB: 07/31/1940  03/03/2020  Ms. Audrey [name] was observed post Covid-19 immunization for 15 minutes without incident. She was provided with Vaccine Information Sheet and instruction to access the V-Safe system.   Ms. Audrey [name] was instructed to call 911 with any severe reactions post vaccine: Marland Kitchen Difficulty breathing  . Swelling of face and throat  . A fast heartbeat  . A bad rash all over body  . Dizziness and weakness

## 2020-03-11 DIAGNOSIS — D519 Vitamin B12 deficiency anemia, unspecified: Secondary | ICD-10-CM | POA: Diagnosis not present

## 2020-03-19 DIAGNOSIS — I10 Essential (primary) hypertension: Secondary | ICD-10-CM | POA: Diagnosis not present

## 2020-03-19 DIAGNOSIS — E7849 Other hyperlipidemia: Secondary | ICD-10-CM | POA: Diagnosis not present

## 2020-03-19 DIAGNOSIS — E063 Autoimmune thyroiditis: Secondary | ICD-10-CM | POA: Diagnosis not present

## 2020-03-22 ENCOUNTER — Encounter: Payer: Self-pay | Admitting: Internal Medicine

## 2020-03-22 ENCOUNTER — Ambulatory Visit (INDEPENDENT_AMBULATORY_CARE_PROVIDER_SITE_OTHER): Payer: Medicare Other | Admitting: Internal Medicine

## 2020-03-22 VITALS — BP 110/62 | HR 92 | Ht 60.0 in | Wt 168.2 lb

## 2020-03-22 DIAGNOSIS — K219 Gastro-esophageal reflux disease without esophagitis: Secondary | ICD-10-CM

## 2020-03-22 DIAGNOSIS — K449 Diaphragmatic hernia without obstruction or gangrene: Secondary | ICD-10-CM

## 2020-03-22 DIAGNOSIS — K222 Esophageal obstruction: Secondary | ICD-10-CM

## 2020-03-22 MED ORDER — PANTOPRAZOLE SODIUM 40 MG PO TBEC: DELAYED_RELEASE_TABLET | ORAL | 3 refills | Status: DC

## 2020-03-22 NOTE — Patient Instructions (Signed)
Normal BMI (Body Mass Index- based on height and weight) is between 23 and 30. Your BMI today is Body mass index is 32.85 kg/m. Marland Kitchen Please consider follow up  regarding your BMI with your Primary Care Provider.  We have sent the following medications to your pharmacy for you to pick up at your convenience: Pantoprazole  Buy a medical wedge pillow and use that to sleep on. If this doesn't help you may use over the counter Gaviscon-take a tablespoon at bedtime as needed. Dr Carlean Purl said a 12 inch pillow is a good size.   May sure and cut tougher meats like chicken / steak into very small pieces or shred the meat before eating.  I appreciate the opportunity to care for you. Silvano Rusk, MD, [facility]

## 2020-03-22 NOTE — Progress Notes (Signed)
Audrey [name] 79 y.o. [birth year] [id number]  Assessment & Plan:   Encounter Diagnoses  Name Primary?   GERD with stricture Yes   Hiatal hernia    She is improved but some nocturnal issues and needs to cut food small and chew well.  Eat slowly.  I do not think further dilation will make a difference at this point at least not right now.  Continue current therapy.  Twice daily high-dose PPI (40 mg pantoprazole)  Try using a medical wedge pillow in the bed to reduce nocturnal symptoms.  Try Gaviscon at bedtime as needed  Return to clinic 1 year sooner as needed  I appreciate the opportunity to care for this patient. CC: Redmond School, MD   Subjective:   Chief Complaint:  HPI Audrey [name] is a 79 year old woman with chronic nocturnal oxygen therapy, fibromyalgia, hypertension, Raynaud's syndrome, and GERD with dysphagia returns for follow-up after a July EGD with esophageal dilation at [facility].  Findings as below:  5 cm hiatal hernia Gr B esophagitis Stenosis dilated to 18 mm with balloon no change seen  Since that time she reports she is improved but still needs to cut her food into small pieces and chew slowly.  Things do moves slowly and there may be some rare impact dysphagia.  That usually with foods like chicken breast, drier meats, and when she does not take her time.  Her heartburn is better at this point though still has some nocturnal symptoms.  She does give several hours between last meal and lying down.  Head of bed is not elevated.  Allergies  Allergen Reactions   Erythromycin Diarrhea   Other Hives and Other (See Comments)    Allergen - EKG electrodes   Statins Other (See Comments)    Makes her hurt bad   Sulfa Antibiotics Hives, Nausea Only and Rash   Tramadol Nausea And Vomiting and Other (See Comments)    medicine has her dizzy and nauseated    Vytorin [Ezetimibe-Simvastatin] Hives   Adhesive [Tape] Rash   Current Meds    Medication Sig   alendronate (FOSAMAX) 70 MG tablet Take 70 mg by mouth every Sunday. Take with a full glass of water on an empty stomach.    Calcium Carb-Cholecalciferol (CALCIUM+D3 PO) Take 1 tablet by mouth daily. 1200 mg (Calcium)-25 mcg (D3)   chlordiazePOXIDE (LIBRIUM) 10 MG capsule Take 10 mg by mouth 2 (two) times daily.    Cholecalciferol (VITAMIN D3) 2000 UNITS TABS Take 2,000 Units by mouth at bedtime.    Coenzyme Q10 200 MG capsule Take 200 mg by mouth daily.   CRANBERRY PO Take 1 tablet by mouth daily.   cyanocobalamin (,VITAMIN B-12,) 1000 MCG/ML injection Inject 1,000 mcg into the muscle every 30 (thirty) days.   Flaxseed, Linseed, (FLAX SEED OIL) 1300 MG CAPS Take 1,300 mg by mouth daily. Omega 3   furosemide (LASIX) 40 MG tablet Take 40 mg by mouth daily.    gabapentin (NEURONTIN) 600 MG tablet Take 600 mg by mouth 2 (two) times daily.    Krill Oil 500 MG CAPS Take 500 mg by mouth daily.   levothyroxine (SYNTHROID, LEVOTHROID) 50 MCG tablet Take 50 mcg by mouth daily before breakfast.    losartan-hydrochlorothiazide (HYZAAR) 100-25 MG per tablet Take 1 tablet by mouth daily.    Multiple Vitamins-Minerals (PRESERVISION AREDS PO) Take 1 tablet by mouth 2 (two) times daily.   OXYGEN Inhale 2 L into the lungs at bedtime.  pantoprazole (PROTONIX) 40 MG tablet TAKE 1 TABLET BY MOUTH DAILY BEFORE BREAKFAST AND TAKE 1 TABLET DAILY BEFORE DINNER (Patient taking differently: Take 40 mg by mouth daily. )   Polyethyl Glycol-Propyl Glycol (SYSTANE) 0.4-0.3 % SOLN Place 1 drop into both eyes daily as needed (Dry eye).   potassium chloride SA (K-DUR,KLOR-CON) 20 MEQ tablet Take 40 mEq by mouth 2 (two) times daily.    PRESCRIPTION MEDICATION Place 1 application vaginally 2 (two) times a week. Custom Compound  Estradiol 0.1mg /Ml Vb Cream - Vaginal PERL   rosuvastatin (CRESTOR) 5 MG tablet Take 5 mg by mouth every Monday, Wednesday, and Friday. In the morning.   SAVELLA  100 MG TABS tablet Take 100 mg by mouth 2 (two) times daily.   triamcinolone ointment (KENALOG) 0.1 % Apply 1 application topically daily as needed for itching.   trimethoprim (TRIMPEX) 100 MG tablet Take 100 mg by mouth daily.   VENTOLIN HFA 108 (90 Base) MCG/ACT inhaler Inhale 2 puffs into the lungs every 6 (six) hours as needed.   Past Medical History:  Diagnosis Date   Allergic rhinitis    Anxiety and depression    Arthritis    Basal cell carcinoma 2002   Chronic lymphocytic thyroiditis    Chronic pain syndrome    Colon polyp    Depression    Diverticulosis    Dyspnea    with excertion   Erosive esophagitis    Family history of heart disease    Fibromyalgia    Gastric polyps    GERD (gastroesophageal reflux disease)    Hashimoto's disease    Hiatal hernia    History of nuclear stress test 04/2010   nonischemic    HLD (hyperlipidemia)    HTN (hypertension)    Hypothyroidism    Interstitial cystitis    Meniere's disease    Obesity    Pinched vertebral nerve    PONV (postoperative nausea and vomiting)    Raynaud's syndrome    Right hip pain 08/18/2012   Sleep apnea    Tuberculosis    postive skin test no symptoms  was around mom that had it   Vitamin B12 deficiency    Past Surgical History:  Procedure Laterality Date   ABDOMINAL HYSTERECTOMY  1982   BALLOON DILATION N/A 11/30/2019   Procedure: BALLOON DILATION;  Surgeon: Mauri Pole, MD;  Location: [facility];  Service: Endoscopy;  Laterality: N/A;   BASAL CELL CARCINOMA EXCISION  03-31-01   face   BLADDER REPAIR  2005   ovaries, tubes removed; pelvic prolapse corrected; rectal repair   BLADDER SURGERY  01-2009   with mesh placement   CATARACT EXTRACTION     bilateral   COLONOSCOPY W/ BIOPSIES AND POLYPECTOMY  12/20/2009   diverticulosis, adenomatous polyps   COLONOSCOPY WITH PROPOFOL N/A 08/26/2017   Procedure: COLONOSCOPY WITH PROPOFOL;  Surgeon: Gatha Mayer, MD;  Location: [facility];  Service: Endoscopy;  Laterality: N/A;   ENDOVENOUS ABLATION SAPHENOUS VEIN W/ LASER Left 08/07/2016   endovenous laser ablation left small saphenous vein by Tinnie Gens MD   ESOPHAGEAL MANOMETRY N/A 05/25/2013   Procedure: ESOPHAGEAL MANOMETRY (EM);  Surgeon: Gatha Mayer, MD;  Location: [facility];  Service: Endoscopy;  Laterality: N/A;   ESOPHAGOGASTRODUODENOSCOPY (EGD) WITH ESOPHAGEAL DILATION  06/09/2012   Procedure: ESOPHAGOGASTRODUODENOSCOPY (EGD) WITH ESOPHAGEAL DILATION;  Surgeon: Gatha Mayer, MD;  Location: [facility];  Service: Endoscopy;  Laterality: N/A;   ESOPHAGOGASTRODUODENOSCOPY (EGD) WITH PROPOFOL N/A 11/30/2019  Procedure: ESOPHAGOGASTRODUODENOSCOPY (EGD) WITH PROPOFOL;  Surgeon: Mauri Pole, MD;  Location: [facility];  Service: Endoscopy;  Laterality: N/A;   FOOT SURGERY  1999   bilateral   HAMMER TOE SURGERY Right 11/13/2017   Procedure: HAMMER TOE REPAIR RIGHT 4TH TOE AND 5TH TOE;  Surgeon: Caprice Beaver, DPM;  Location: [facility];  Service: Podiatry;  Laterality: Right;   INTERSTIM IMPLANT PLACEMENT  01/2012   [facility]   laser surgery bladder  05-25-10   on mesh   LIPOMA EXCISION     right arm   NASOLACRIMAL DUCT PROBING  09-12-10   right eye   NASOLACRIMAL DUCT PROBING  09-26-10   left eye   OSTECTOMY Right 11/13/2017   Procedure: OSTECTOMY RIGHT 5TH TOE;  Surgeon: Caprice Beaver, DPM;  Location: [facility];  Service: Podiatry;  Laterality: Right;   removal of bladder mesh  03-01-11   [facility]   TONSILLECTOMY  1959   TRANSTHORACIC ECHOCARDIOGRAM  12/2006   EF=>55%; LA mild-mod dilated; RA mildly dilated; mild mitral annular calcif, mild MR; mod TR & elevated RVSP; mild pulm vavle regurg   UPPER GASTROINTESTINAL ENDOSCOPY  12/20/2009   GERD   Social History   Social History Narrative   Married   1 son and 2 daughters, 10 grandchildren   Remains active   family history includes Breast cancer in her maternal  aunt; Clotting disorder in her grandchild and paternal grandmother; Colon cancer in her maternal grandfather and maternal uncle; Colon polyps in her father; Crohn's disease in her grandchild; Diabetes in her brother, brother, father, maternal grandmother, and mother; Heart disease in her father and paternal grandfather; Hyperlipidemia in her father; Hypertension in her father and son; Pancreatic cancer in her mother; Prostate cancer in her father; Stroke in her brother and brother; Tuberculosis in her mother; Von Willebrand disease in her daughter and daughter.   Review of Systems As per HPI  Objective:   Physical Exam BP 110/62    Pulse 92    Ht 5' (1.524 m)    Wt 168 lb 3.2 oz (76.3 kg)    BMI 32.85 kg/m

## 2020-04-19 DIAGNOSIS — E063 Autoimmune thyroiditis: Secondary | ICD-10-CM | POA: Diagnosis not present

## 2020-04-19 DIAGNOSIS — E7849 Other hyperlipidemia: Secondary | ICD-10-CM | POA: Diagnosis not present

## 2020-04-19 DIAGNOSIS — I10 Essential (primary) hypertension: Secondary | ICD-10-CM | POA: Diagnosis not present

## 2020-05-12 DIAGNOSIS — D519 Vitamin B12 deficiency anemia, unspecified: Secondary | ICD-10-CM | POA: Diagnosis not present

## 2020-05-12 DIAGNOSIS — N342 Other urethritis: Secondary | ICD-10-CM | POA: Diagnosis not present

## 2020-05-12 DIAGNOSIS — E6609 Other obesity due to excess calories: Secondary | ICD-10-CM | POA: Diagnosis not present

## 2020-05-12 DIAGNOSIS — Z6830 Body mass index (BMI) 30.0-30.9, adult: Secondary | ICD-10-CM | POA: Diagnosis not present

## 2020-05-12 DIAGNOSIS — N390 Urinary tract infection, site not specified: Secondary | ICD-10-CM | POA: Diagnosis not present

## 2020-05-20 DIAGNOSIS — E063 Autoimmune thyroiditis: Secondary | ICD-10-CM | POA: Diagnosis not present

## 2020-05-20 DIAGNOSIS — I10 Essential (primary) hypertension: Secondary | ICD-10-CM | POA: Diagnosis not present

## 2020-05-20 DIAGNOSIS — E7849 Other hyperlipidemia: Secondary | ICD-10-CM | POA: Diagnosis not present

## 2020-05-23 DIAGNOSIS — R42 Dizziness and giddiness: Secondary | ICD-10-CM | POA: Diagnosis not present

## 2020-05-23 DIAGNOSIS — E6609 Other obesity due to excess calories: Secondary | ICD-10-CM | POA: Diagnosis not present

## 2020-05-23 DIAGNOSIS — Z6830 Body mass index (BMI) 30.0-30.9, adult: Secondary | ICD-10-CM | POA: Diagnosis not present

## 2020-05-23 DIAGNOSIS — N342 Other urethritis: Secondary | ICD-10-CM | POA: Diagnosis not present

## 2020-06-10 DIAGNOSIS — Z6830 Body mass index (BMI) 30.0-30.9, adult: Secondary | ICD-10-CM | POA: Diagnosis not present

## 2020-06-10 DIAGNOSIS — F321 Major depressive disorder, single episode, moderate: Secondary | ICD-10-CM | POA: Diagnosis not present

## 2020-06-10 DIAGNOSIS — E063 Autoimmune thyroiditis: Secondary | ICD-10-CM | POA: Diagnosis not present

## 2020-06-10 DIAGNOSIS — N390 Urinary tract infection, site not specified: Secondary | ICD-10-CM | POA: Diagnosis not present

## 2020-06-10 DIAGNOSIS — N342 Other urethritis: Secondary | ICD-10-CM | POA: Diagnosis not present

## 2020-06-10 DIAGNOSIS — E6609 Other obesity due to excess calories: Secondary | ICD-10-CM | POA: Diagnosis not present

## 2020-06-10 DIAGNOSIS — I10 Essential (primary) hypertension: Secondary | ICD-10-CM | POA: Diagnosis not present

## 2020-06-18 DIAGNOSIS — E063 Autoimmune thyroiditis: Secondary | ICD-10-CM | POA: Diagnosis not present

## 2020-06-18 DIAGNOSIS — E7849 Other hyperlipidemia: Secondary | ICD-10-CM | POA: Diagnosis not present

## 2020-06-18 DIAGNOSIS — I10 Essential (primary) hypertension: Secondary | ICD-10-CM | POA: Diagnosis not present

## 2020-07-18 DIAGNOSIS — E7849 Other hyperlipidemia: Secondary | ICD-10-CM | POA: Diagnosis not present

## 2020-07-18 DIAGNOSIS — I10 Essential (primary) hypertension: Secondary | ICD-10-CM | POA: Diagnosis not present

## 2020-07-18 DIAGNOSIS — E063 Autoimmune thyroiditis: Secondary | ICD-10-CM | POA: Diagnosis not present

## 2020-07-19 DIAGNOSIS — Z1231 Encounter for screening mammogram for malignant neoplasm of breast: Secondary | ICD-10-CM | POA: Diagnosis not present

## 2020-07-22 DIAGNOSIS — D1801 Hemangioma of skin and subcutaneous tissue: Secondary | ICD-10-CM | POA: Diagnosis not present

## 2020-07-22 DIAGNOSIS — D225 Melanocytic nevi of trunk: Secondary | ICD-10-CM | POA: Diagnosis not present

## 2020-07-22 DIAGNOSIS — L814 Other melanin hyperpigmentation: Secondary | ICD-10-CM | POA: Diagnosis not present

## 2020-07-22 DIAGNOSIS — L570 Actinic keratosis: Secondary | ICD-10-CM | POA: Diagnosis not present

## 2020-07-22 DIAGNOSIS — Z85828 Personal history of other malignant neoplasm of skin: Secondary | ICD-10-CM | POA: Diagnosis not present

## 2020-07-22 DIAGNOSIS — L905 Scar conditions and fibrosis of skin: Secondary | ICD-10-CM | POA: Diagnosis not present

## 2020-07-22 DIAGNOSIS — L821 Other seborrheic keratosis: Secondary | ICD-10-CM | POA: Diagnosis not present

## 2020-08-17 DIAGNOSIS — I10 Essential (primary) hypertension: Secondary | ICD-10-CM | POA: Diagnosis not present

## 2020-08-17 DIAGNOSIS — E063 Autoimmune thyroiditis: Secondary | ICD-10-CM | POA: Diagnosis not present

## 2020-08-17 DIAGNOSIS — E7849 Other hyperlipidemia: Secondary | ICD-10-CM | POA: Diagnosis not present

## 2020-08-25 DIAGNOSIS — L570 Actinic keratosis: Secondary | ICD-10-CM | POA: Diagnosis not present

## 2020-09-14 ENCOUNTER — Other Ambulatory Visit: Payer: Self-pay

## 2020-09-14 ENCOUNTER — Emergency Department (HOSPITAL_COMMUNITY)
Admission: EM | Admit: 2020-09-14 | Discharge: 2020-09-14 | Disposition: A | Discharge status: Home / Self Care | Payer: Medicare Other | Attending: Emergency Medicine | Admitting: Emergency Medicine

## 2020-09-14 ENCOUNTER — Emergency Department (HOSPITAL_COMMUNITY): Payer: Medicare Other

## 2020-09-14 ENCOUNTER — Encounter (HOSPITAL_COMMUNITY): Payer: Self-pay | Admitting: *Deleted

## 2020-09-14 DIAGNOSIS — I10 Essential (primary) hypertension: Secondary | ICD-10-CM | POA: Insufficient documentation

## 2020-09-14 DIAGNOSIS — Z79899 Other long term (current) drug therapy: Secondary | ICD-10-CM | POA: Diagnosis not present

## 2020-09-14 DIAGNOSIS — E039 Hypothyroidism, unspecified: Secondary | ICD-10-CM | POA: Diagnosis not present

## 2020-09-14 DIAGNOSIS — Y9389 Activity, other specified: Secondary | ICD-10-CM | POA: Insufficient documentation

## 2020-09-14 DIAGNOSIS — Z85828 Personal history of other malignant neoplasm of skin: Secondary | ICD-10-CM | POA: Diagnosis not present

## 2020-09-14 DIAGNOSIS — S61215A Laceration without foreign body of left ring finger without damage to nail, initial encounter: Secondary | ICD-10-CM | POA: Insufficient documentation

## 2020-09-14 DIAGNOSIS — W230XXA Caught, crushed, jammed, or pinched between moving objects, initial encounter: Secondary | ICD-10-CM | POA: Diagnosis not present

## 2020-09-14 DIAGNOSIS — Z23 Encounter for immunization: Secondary | ICD-10-CM | POA: Insufficient documentation

## 2020-09-14 DIAGNOSIS — S6992XA Unspecified injury of left wrist, hand and finger(s), initial encounter: Secondary | ICD-10-CM | POA: Diagnosis present

## 2020-09-14 MED ORDER — ACETAMINOPHEN 325 MG PO TABS
650.0000 mg | ORAL_TABLET | Freq: Once | ORAL | Status: AC
  Administered 2020-09-14: 650 mg via ORAL
  Filled 2020-09-14: qty 2

## 2020-09-14 MED ORDER — LIDOCAINE-EPINEPHRINE (PF) 2 %-1:200000 IJ SOLN
20.0000 mL | Freq: Once | INTRAMUSCULAR | Status: AC
  Administered 2020-09-14: 20 mL
  Filled 2020-09-14: qty 20

## 2020-09-14 MED ORDER — POVIDONE-IODINE 10 % EX SOLN
CUTANEOUS | Status: AC
  Filled 2020-09-14: qty 15

## 2020-09-14 MED ORDER — TETANUS-DIPHTH-ACELL PERTUSSIS 5-2.5-18.5 LF-MCG/0.5 IM SUSY
0.5000 mL | PREFILLED_SYRINGE | Freq: Once | INTRAMUSCULAR | Status: AC
  Administered 2020-09-14: 0.5 mL via INTRAMUSCULAR
  Filled 2020-09-14: qty 0.5

## 2020-09-14 NOTE — ED Triage Notes (Signed)
Pt cut left ring finger while moving a desk .

## 2020-09-14 NOTE — Discharge Instructions (Signed)
Keep area clean by washing with soap and water daily. Do not submerge in water or scrub stitches Apply a bandage at least once daily, change more often if it is dirty Watch for signs of infection (redness, drainage, worsening pain) Take Tylenol or Ibuprofen for pain as needed Have stitches removed in 7-10 days  

## 2020-09-14 NOTE — ED Notes (Signed)
Xray done at bedside

## 2020-09-14 NOTE — ED Provider Notes (Signed)
[facility] EMERGENCY DEPARTMENT Provider Note   CSN: [id number] Arrival date & time: 09/14/20  1202     History Chief Complaint  Patient presents with  . Laceration    Audrey [name] is a 80 y.o. female.  HPI 80 year old female presents to the ER with complaints of a laceration to her left ring finger.  Patient states that she was moving a desk and accidentally got her hand caught in one of the drawers.  Bleeding controlled on arrival.  Unknown last tetanus shot.  No numbness or tingling.    Past Medical History:  Diagnosis Date  . Allergic rhinitis   . Anxiety and depression   . Arthritis   . Basal cell carcinoma 2002  . Chronic lymphocytic thyroiditis   . Chronic pain syndrome   . Colon polyp   . Depression   . Diverticulosis   . Dyspnea    with excertion  . Erosive esophagitis   . Family history of heart disease   . Fibromyalgia   . Gastric polyps   . GERD (gastroesophageal reflux disease)   . Hashimoto's disease   . Hiatal hernia   . History of nuclear stress test 04/2010   nonischemic   . HLD (hyperlipidemia)   . HTN (hypertension)   . Hypothyroidism   . Interstitial cystitis   . Meniere's disease   . Obesity   . Pinched vertebral nerve   . PONV (postoperative nausea and vomiting)   . Raynaud's syndrome   . Right hip pain 08/18/2012  . Sleep apnea   . Tuberculosis    postive skin test no symptoms  was around mom that had it  . Vitamin B12 deficiency     Patient Active Problem List   Diagnosis Date Noted  . Hiatal hernia   . OSA (obstructive sleep apnea) 12/10/2018  . Varicose veins of left lower extremity with complications [id number]  . 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 Presbycusis of both ears 03/06/2016  . Adrenal adenoma, 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  . Acquired hypothyroidism 09/24/2014  . Recurrent UTI 09/22/2014  . Cough 05/08/2013  . Erosion of implanted vaginal mesh to surrounding organ or tissue ([place]) 05/05/2013  . Essential  hypertension 03/25/2013  . Hyperlipidemia 03/25/2013  . Nocturnal hypoxemia 03/05/2013  . Lumbago 10/16/2012  . Low back pain 08/18/2012  . Right hip pain 08/18/2012  . Overactive bladder 03/21/2012  . S/P balloon dilatation of esophageal stricture 01/29/2012  . Raynaud's disease 01/29/2012  . Meniere disease 01/29/2012  . Fibromyalgia 01/29/2012  . Fecal incontinence 12/27/2011  . Urge incontinence 12/27/2011  . Atrophic vaginitis 12/27/2011  . Family history of von Willebrand disease 06/26/2011  . GERD 11/29/2009  . Esophageal dysphagia 11/29/2009    Past Surgical History:  Procedure Laterality Date  . ABDOMINAL HYSTERECTOMY  1982  . BALLOON DILATION N/A 11/30/2019   Procedure: BALLOON DILATION;  Surgeon: Mauri Pole, MD;  [facility]: [facility];  Service: Endoscopy;  Laterality: N/A;  . BASAL CELL CARCINOMA EXCISION  03-31-01   face  . BLADDER REPAIR  2005   ovaries, tubes removed; pelvic prolapse corrected; rectal repair  . BLADDER SURGERY  01-2009   with mesh placement  . CATARACT EXTRACTION     bilateral  . COLONOSCOPY W/ BIOPSIES AND POLYPECTOMY  12/20/2009   diverticulosis, adenomatous polyps  . COLONOSCOPY WITH PROPOFOL N/A 08/26/2017   Procedure: COLONOSCOPY WITH PROPOFOL;  Surgeon: Gatha Mayer, MD;  [facility]: [facility];  Service: Endoscopy;  Laterality: N/A;  .  ENDOVENOUS ABLATION SAPHENOUS VEIN W/ LASER Left 08/07/2016   endovenous laser ablation left small saphenous vein by Tinnie Gens MD  . ESOPHAGEAL MANOMETRY N/A 05/25/2013   Procedure: ESOPHAGEAL MANOMETRY (EM);  Surgeon: Gatha Mayer, MD;  [facility];  Service: Endoscopy;  Laterality: N/A;  . ESOPHAGOGASTRODUODENOSCOPY (EGD) WITH ESOPHAGEAL DILATION  06/09/2012   Procedure: ESOPHAGOGASTRODUODENOSCOPY (EGD) WITH ESOPHAGEAL DILATION;  Surgeon: Gatha Mayer, MD;  [facility];  Service: Endoscopy;  Laterality: N/A;  . ESOPHAGOGASTRODUODENOSCOPY (EGD) WITH PROPOFOL N/A 11/30/2019    Procedure: ESOPHAGOGASTRODUODENOSCOPY (EGD) WITH PROPOFOL;  Surgeon: Mauri Pole, MD;  [facility];  Service: Endoscopy;  Laterality: N/A;  . FOOT SURGERY  1999   bilateral  . HAMMER TOE SURGERY Right 11/13/2017   Procedure: HAMMER TOE REPAIR RIGHT 4TH TOE AND 5TH TOE;  Surgeon: Caprice Beaver, DPM;  Location: [facility];  Service: Podiatry;  Laterality: Right;  . INTERSTIM IMPLANT PLACEMENT  01/2012   [facility]  . laser surgery bladder  05-25-10   on mesh  . LIPOMA EXCISION     right arm  . NASOLACRIMAL DUCT PROBING  09-12-10   right eye  . NASOLACRIMAL DUCT PROBING  09-26-10   left eye  . OSTECTOMY Right 11/13/2017   Procedure: OSTECTOMY RIGHT 5TH TOE;  Surgeon: Caprice Beaver, DPM;  Location: [facility];  Service: Podiatry;  Laterality: Right;  . removal of bladder mesh  03-01-11   [facility]  . TONSILLECTOMY  1959  . TRANSTHORACIC ECHOCARDIOGRAM  12/2006   EF=>55%; LA mild-mod dilated; RA mildly dilated; mild mitral annular calcif, mild MR; mod TR & elevated RVSP; mild pulm vavle regurg  . UPPER GASTROINTESTINAL ENDOSCOPY  12/20/2009   GERD     OB History   No obstetric history on file.     Family History  Problem Relation Age of Onset  . Pancreatic cancer Mother   . Diabetes Mother   . Tuberculosis Mother   . Prostate cancer Father   . Colon polyps Father   . Diabetes Father   . Hyperlipidemia Father   . Hypertension Father        also kidney disease  . Heart disease Father        CABG, twice  . Diabetes Maternal Grandmother   . Colon cancer Maternal Grandfather   . Clotting disorder Paternal Grandmother        blood disorder  . Heart disease Paternal Grandfather   . Stroke Brother   . Diabetes Brother   . Diabetes Brother   . Stroke Brother   . Breast cancer Maternal Aunt        x 4  . Colon cancer Maternal Uncle   . Von Willebrand disease Daughter   . Von Willebrand disease Daughter   . Hypertension Son   . Crohn's disease Grandchild   . Clotting  disorder Grandchild        x3    Social History   Tobacco Use  . Smoking status: Never Smoker  . Smokeless tobacco: Never Used  Vaping Use  . Vaping Use: Never used  Substance Use Topics  . Alcohol use: No  . Drug use: No    Home Medications Prior to Admission medications   Medication Sig Start Date End Date Taking? Authorizing Provider  alendronate (FOSAMAX) 70 MG tablet Take 70 mg by mouth every Sunday. Take with a full glass of water on an empty stomach.   Yes [provider]  Calcium Carb-Cholecalciferol (CALCIUM+D3 PO) Take  1 tablet by mouth daily. 1200 mg (Calcium)-25 mcg (D3)   Yes [provider]  chlordiazePOXIDE (LIBRIUM) 10 MG capsule Take 10 mg by mouth 2 (two) times daily.    Yes [provider]  Cholecalciferol (VITAMIN D3) 2000 UNITS TABS Take 2,000 Units by mouth at bedtime.    Yes [provider]  Coenzyme Q10 200 MG capsule Take 200 mg by mouth daily.   Yes [provider]  CRANBERRY PO Take 1 tablet by mouth daily.   Yes [provider]  Flaxseed, Linseed, (FLAX SEED OIL) 1300 MG CAPS Take 1,300 mg by mouth 2 (two) times daily. Omega 3   Yes [provider]  furosemide (LASIX) 40 MG tablet Take 40 mg by mouth daily.    Yes [provider]  gabapentin (NEURONTIN) 600 MG tablet Take 600 mg by mouth 2 (two) times daily.    Yes [provider]  Javier Docker Oil 500 MG CAPS Take 500 mg by mouth daily.   Yes [provider]  levothyroxine (SYNTHROID, LEVOTHROID) 50 MCG tablet Take 50 mcg by mouth daily before breakfast.    Yes [provider]  losartan-hydrochlorothiazide (HYZAAR) 100-25 MG per tablet Take 1 tablet by mouth daily.  02/24/13  Yes [provider]  OXYGEN Inhale 2 L into the lungs at bedtime.    Yes [provider]  pantoprazole (PROTONIX) 40 MG tablet TAKE 1 TABLET BY MOUTH DAILY BEFORE BREAKFAST AND TAKE 1 TABLET DAILY BEFORE DINNER 03/22/20  Yes  Gatha Mayer, MD  Polyethyl Glycol-Propyl Glycol (SYSTANE) 0.4-0.3 % SOLN Place 1 drop into both eyes daily as needed (Dry eye).   Yes [provider]  potassium chloride SA (K-DUR,KLOR-CON) 20 MEQ tablet Take 40 mEq by mouth 2 (two) times daily.    Yes [provider]  PRESCRIPTION MEDICATION Place 1 application vaginally 2 (two) times a week. Custom Compound  Estradiol 0.1mg /Ml Vb Cream - Vaginal PERL   Yes [provider]  rosuvastatin (CRESTOR) 5 MG tablet Take 5 mg by mouth every Monday, Wednesday, and Friday. In the morning.   Yes [provider]  SAVELLA 100 MG TABS tablet Take 100 mg by mouth 2 (two) times daily. 10/11/17  Yes [provider]  VENTOLIN HFA 108 (90 Base) MCG/ACT inhaler Inhale 2 puffs into the lungs every 6 (six) hours as needed. 06/23/19  Yes [provider]  cyanocobalamin (,VITAMIN B-12,) 1000 MCG/ML injection Inject 1,000 mcg into the muscle every 30 (thirty) days. Patient not taking: Reported on 09/14/2020    [provider]  triamcinolone ointment (KENALOG) 0.1 % Apply 1 application topically daily as needed for itching. Patient not taking: Reported on 09/14/2020 07/07/19   [provider]    Allergies    Erythromycin, Other, Statins, Sulfa antibiotics, Tramadol, Vytorin [ezetimibe-simvastatin], and Adhesive [tape]  Review of Systems   Review of Systems  Skin: Positive for color change and wound.  Neurological: Negative for weakness and numbness.    Physical Exam Updated Vital Signs BP (!) 99/50 (BP Location: Right Arm)   Pulse 85   Temp 97.6 F (36.4 C) (Oral)   Resp 14   Ht 5' (1.524 m)   Wt 70.3 kg   SpO2 99%   BMI 30.27 kg/m   Physical Exam Vitals and nursing note reviewed.  Constitutional:      General: She is not in acute distress.    Appearance: She is well-developed.  HENT:     Head:  Normocephalic and atraumatic.  Eyes:     Conjunctiva/sclera: Conjunctivae normal.   Cardiovascular:     Rate and Rhythm: Normal rate and regular rhythm.     Heart sounds: No murmur heard.   Pulmonary:     Effort: Pulmonary effort is normal. No respiratory distress.     Breath sounds: Normal breath sounds.  Abdominal:     Palpations: Abdomen is soft.     Tenderness: There is no abdominal tenderness.  Musculoskeletal:     Cervical back: Neck supple.     Comments: Left DIP pad with visible 1 mm x 0.5 mm laceration.  Bleeding controlled.  No visible foreign bodies.  Full flexion and extension of the DIP and PIP joints.  Neurovascularly intact.  Skin:    General: Skin is warm and dry.  Neurological:     Mental Status: She is alert.     ED Results / Procedures / Treatments   Labs (all labs ordered are listed, but only abnormal results are displayed) Labs Reviewed - No data to display  EKG None  Radiology DG Finger Ring Left  Result Date: 09/14/2020 CLINICAL DATA:  Laceration. EXAM: LEFT RING FINGER 2+V COMPARISON:  None. FINDINGS: Soft tissue laceration is evident along the volar aspect of the index finger at the DIP. No underlying fracture is present. No body is present. The joint is located. IMPRESSION: Soft tissue laceration without underlying fracture or radiopaque foreign body. Electronically Signed   By: San Morelle M.D.   On: 09/14/2020 13:18    Procedures .Marland KitchenLaceration Repair  Date/Time: 09/14/2020 1:56 PM Performed by: Garald Balding, PA-C Authorized by: Garald Balding, PA-C   Consent:    Consent obtained:  Verbal   Consent given by:  Patient   Risks discussed:  Infection, need for additional repair, pain, poor cosmetic result and poor wound healing   Alternatives discussed:  No treatment and delayed treatment Universal protocol:    Procedure explained and questions answered to patient or proxy's satisfaction: yes     Relevant documents present and verified: yes     Test results available: yes     Imaging studies available: yes      Required blood products, implants, devices, and special equipment available: yes     Site/side marked: yes     Immediately prior to procedure, a time out was called: yes     Patient identity confirmed:  Verbally with patient Anesthesia:    Anesthesia method:  Local infiltration Laceration details:    Location:  Finger   Finger location:  L ring finger   Length (cm):  1   Depth (mm):  0.5 Exploration:    Imaging obtained: x-ray     Imaging outcome: foreign body not noted     Wound exploration: wound explored through full range of motion     Wound extent: no nerve damage noted and no tendon damage noted   Treatment:    Area cleansed with:  Saline and povidone-iodine   Amount of cleaning:  Standard   Irrigation solution:  Sterile saline   Irrigation method:  Syringe Skin repair:    Repair method:  Sutures   Suture size:  5-0   Suture material:  Prolene   Suture technique:  Simple interrupted   Number of sutures:  5 Approximation:    Approximation:  Close Repair type:    Repair type:  Simple Post-procedure details:    Dressing:  Non-adherent dressing   Procedure completion:  Tolerated well,  no immediate complications     Medications Ordered in ED Medications  Tdap (BOOSTRIX) injection 0.5 mL (0.5 mLs Intramuscular Given 09/14/20 1227)  lidocaine-EPINEPHrine (XYLOCAINE W/EPI) 2 %-1:200000 (PF) injection 20 mL (20 mLs Infiltration Given 09/14/20 1237)  povidone-iodine (BETADINE) 10 % external solution (  Given 09/14/20 1238)  acetaminophen (TYLENOL) tablet 650 mg (650 mg Oral Given 09/14/20 1255)    ED Course  I have reviewed the triage vital signs and the nursing notes.  Pertinent labs & imaging results that were available during my care of the patient were reviewed by me and considered in my medical decision making (see chart for details).    MDM Rules/Calculators/A&P                          Pressure irrigation performed. Wound explored and base of wound visualized in a  bloodless field without evidence of foreign body.  Laceration occurred < 8 hours prior to repair which was well tolerated.Tdap updated.  Pt has  no comorbidities to effect normal wound healing.  Plain films without underlying fractures or foreign bodies.  She is blood pressure 99/50 on arrival to the ER, patient states that this is normal for her and she is working with her PCP to adjust her blood pressure medicines.  Will defer to PCP.  No evidence of hypovolemia or sepsis.  Pt discharged without antibiotics.  Discussed suture home care with patient and answered questions. Pt to follow-up for wound check and suture removal in 7 days; they are to return to the ED sooner for signs of infection. Pt is hemodynamically stable with no complaints prior to dc.   Final Clinical Impression(s) / ED Diagnoses Final diagnoses:  Laceration of left ring finger without foreign body without damage to nail, initial encounter    Rx / DC Orders ED Discharge Orders    None       Lyndel Safe 09/14/20 1406    Davonna Belling, MD 09/14/20 1536

## 2020-09-17 DIAGNOSIS — E063 Autoimmune thyroiditis: Secondary | ICD-10-CM | POA: Diagnosis not present

## 2020-09-17 DIAGNOSIS — I10 Essential (primary) hypertension: Secondary | ICD-10-CM | POA: Diagnosis not present

## 2020-09-17 DIAGNOSIS — E7849 Other hyperlipidemia: Secondary | ICD-10-CM | POA: Diagnosis not present

## 2020-09-21 DIAGNOSIS — S61211A Laceration without foreign body of left index finger without damage to nail, initial encounter: Secondary | ICD-10-CM | POA: Diagnosis not present

## 2020-09-21 DIAGNOSIS — I951 Orthostatic hypotension: Secondary | ICD-10-CM | POA: Diagnosis not present

## 2020-09-21 DIAGNOSIS — R55 Syncope and collapse: Secondary | ICD-10-CM | POA: Diagnosis not present

## 2020-09-21 DIAGNOSIS — D519 Vitamin B12 deficiency anemia, unspecified: Secondary | ICD-10-CM | POA: Diagnosis not present

## 2020-09-21 DIAGNOSIS — I10 Essential (primary) hypertension: Secondary | ICD-10-CM | POA: Diagnosis not present

## 2020-09-21 DIAGNOSIS — Z6828 Body mass index (BMI) 28.0-28.9, adult: Secondary | ICD-10-CM | POA: Diagnosis not present

## 2020-09-21 DIAGNOSIS — E063 Autoimmune thyroiditis: Secondary | ICD-10-CM | POA: Diagnosis not present

## 2020-09-23 DIAGNOSIS — R351 Nocturia: Secondary | ICD-10-CM | POA: Diagnosis not present

## 2020-09-23 DIAGNOSIS — R350 Frequency of micturition: Secondary | ICD-10-CM | POA: Diagnosis not present

## 2020-09-23 DIAGNOSIS — N3946 Mixed incontinence: Secondary | ICD-10-CM | POA: Diagnosis not present

## 2020-10-14 DIAGNOSIS — N390 Urinary tract infection, site not specified: Secondary | ICD-10-CM | POA: Diagnosis not present

## 2020-10-14 DIAGNOSIS — N3946 Mixed incontinence: Secondary | ICD-10-CM | POA: Diagnosis not present

## 2020-10-14 DIAGNOSIS — R3912 Poor urinary stream: Secondary | ICD-10-CM | POA: Diagnosis not present

## 2020-10-28 DIAGNOSIS — Z23 Encounter for immunization: Secondary | ICD-10-CM | POA: Diagnosis not present

## 2020-11-04 DIAGNOSIS — N3946 Mixed incontinence: Secondary | ICD-10-CM | POA: Diagnosis not present

## 2020-11-15 DIAGNOSIS — J309 Allergic rhinitis, unspecified: Secondary | ICD-10-CM | POA: Diagnosis not present

## 2020-11-15 DIAGNOSIS — M79671 Pain in right foot: Secondary | ICD-10-CM | POA: Diagnosis not present

## 2020-11-15 DIAGNOSIS — I10 Essential (primary) hypertension: Secondary | ICD-10-CM | POA: Diagnosis not present

## 2020-11-15 DIAGNOSIS — Z1389 Encounter for screening for other disorder: Secondary | ICD-10-CM | POA: Diagnosis not present

## 2020-11-15 DIAGNOSIS — M2041 Other hammer toe(s) (acquired), right foot: Secondary | ICD-10-CM | POA: Diagnosis not present

## 2020-11-15 DIAGNOSIS — Q828 Other specified congenital malformations of skin: Secondary | ICD-10-CM | POA: Diagnosis not present

## 2020-11-15 DIAGNOSIS — Z1331 Encounter for screening for depression: Secondary | ICD-10-CM | POA: Diagnosis not present

## 2020-11-15 DIAGNOSIS — R32 Unspecified urinary incontinence: Secondary | ICD-10-CM | POA: Diagnosis not present

## 2020-11-15 DIAGNOSIS — E663 Overweight: Secondary | ICD-10-CM | POA: Diagnosis not present

## 2020-11-15 DIAGNOSIS — Z6829 Body mass index (BMI) 29.0-29.9, adult: Secondary | ICD-10-CM | POA: Diagnosis not present

## 2020-11-15 DIAGNOSIS — D519 Vitamin B12 deficiency anemia, unspecified: Secondary | ICD-10-CM | POA: Diagnosis not present

## 2020-11-15 DIAGNOSIS — E782 Mixed hyperlipidemia: Secondary | ICD-10-CM | POA: Diagnosis not present

## 2020-11-15 DIAGNOSIS — E063 Autoimmune thyroiditis: Secondary | ICD-10-CM | POA: Diagnosis not present

## 2020-11-15 DIAGNOSIS — Z0000 Encounter for general adult medical examination without abnormal findings: Secondary | ICD-10-CM | POA: Diagnosis not present

## 2020-12-06 DIAGNOSIS — H35033 Hypertensive retinopathy, bilateral: Secondary | ICD-10-CM | POA: Diagnosis not present

## 2020-12-06 DIAGNOSIS — H353132 Nonexudative age-related macular degeneration, bilateral, intermediate dry stage: Secondary | ICD-10-CM | POA: Diagnosis not present

## 2020-12-06 DIAGNOSIS — H00032 Abscess of right lower eyelid: Secondary | ICD-10-CM | POA: Diagnosis not present

## 2020-12-06 DIAGNOSIS — H209 Unspecified iridocyclitis: Secondary | ICD-10-CM | POA: Diagnosis not present

## 2020-12-06 DIAGNOSIS — Z961 Presence of intraocular lens: Secondary | ICD-10-CM | POA: Diagnosis not present

## 2020-12-06 DIAGNOSIS — H43813 Vitreous degeneration, bilateral: Secondary | ICD-10-CM | POA: Diagnosis not present

## 2020-12-12 DIAGNOSIS — M8589 Other specified disorders of bone density and structure, multiple sites: Secondary | ICD-10-CM | POA: Diagnosis not present

## 2020-12-28 DIAGNOSIS — N3946 Mixed incontinence: Secondary | ICD-10-CM | POA: Diagnosis not present

## 2020-12-28 DIAGNOSIS — R350 Frequency of micturition: Secondary | ICD-10-CM | POA: Diagnosis not present

## 2020-12-29 DIAGNOSIS — D519 Vitamin B12 deficiency anemia, unspecified: Secondary | ICD-10-CM | POA: Diagnosis not present

## 2021-01-17 DIAGNOSIS — H02832 Dermatochalasis of right lower eyelid: Secondary | ICD-10-CM | POA: Diagnosis not present

## 2021-01-17 DIAGNOSIS — H43813 Vitreous degeneration, bilateral: Secondary | ICD-10-CM | POA: Diagnosis not present

## 2021-01-17 DIAGNOSIS — Z961 Presence of intraocular lens: Secondary | ICD-10-CM | POA: Diagnosis not present

## 2021-01-17 DIAGNOSIS — H04123 Dry eye syndrome of bilateral lacrimal glands: Secondary | ICD-10-CM | POA: Diagnosis not present

## 2021-01-17 DIAGNOSIS — H0102B Squamous blepharitis left eye, upper and lower eyelids: Secondary | ICD-10-CM | POA: Diagnosis not present

## 2021-01-17 DIAGNOSIS — H353131 Nonexudative age-related macular degeneration, bilateral, early dry stage: Secondary | ICD-10-CM | POA: Diagnosis not present

## 2021-01-17 DIAGNOSIS — H0102A Squamous blepharitis right eye, upper and lower eyelids: Secondary | ICD-10-CM | POA: Diagnosis not present

## 2021-01-17 DIAGNOSIS — H02835 Dermatochalasis of left lower eyelid: Secondary | ICD-10-CM | POA: Diagnosis not present

## 2021-01-25 DIAGNOSIS — D1801 Hemangioma of skin and subcutaneous tissue: Secondary | ICD-10-CM | POA: Diagnosis not present

## 2021-01-25 DIAGNOSIS — L821 Other seborrheic keratosis: Secondary | ICD-10-CM | POA: Diagnosis not present

## 2021-01-25 DIAGNOSIS — L814 Other melanin hyperpigmentation: Secondary | ICD-10-CM | POA: Diagnosis not present

## 2021-01-25 DIAGNOSIS — L538 Other specified erythematous conditions: Secondary | ICD-10-CM | POA: Diagnosis not present

## 2021-01-25 DIAGNOSIS — L820 Inflamed seborrheic keratosis: Secondary | ICD-10-CM | POA: Diagnosis not present

## 2021-01-25 DIAGNOSIS — L570 Actinic keratosis: Secondary | ICD-10-CM | POA: Diagnosis not present

## 2021-01-25 DIAGNOSIS — D485 Neoplasm of uncertain behavior of skin: Secondary | ICD-10-CM | POA: Diagnosis not present

## 2021-01-25 DIAGNOSIS — Z85828 Personal history of other malignant neoplasm of skin: Secondary | ICD-10-CM | POA: Diagnosis not present

## 2021-01-25 DIAGNOSIS — Z08 Encounter for follow-up examination after completed treatment for malignant neoplasm: Secondary | ICD-10-CM | POA: Diagnosis not present

## 2021-02-14 DIAGNOSIS — R350 Frequency of micturition: Secondary | ICD-10-CM | POA: Diagnosis not present

## 2021-02-14 DIAGNOSIS — N3946 Mixed incontinence: Secondary | ICD-10-CM | POA: Diagnosis not present

## 2021-02-14 DIAGNOSIS — R8271 Bacteriuria: Secondary | ICD-10-CM | POA: Diagnosis not present

## 2021-02-27 DIAGNOSIS — Z23 Encounter for immunization: Secondary | ICD-10-CM | POA: Diagnosis not present

## 2021-02-27 DIAGNOSIS — D511 Vitamin B12 deficiency anemia due to selective vitamin B12 malabsorption with proteinuria: Secondary | ICD-10-CM | POA: Diagnosis not present

## 2021-03-24 DIAGNOSIS — Z23 Encounter for immunization: Secondary | ICD-10-CM | POA: Diagnosis not present

## 2021-03-30 DIAGNOSIS — Z20822 Contact with and (suspected) exposure to covid-19: Secondary | ICD-10-CM | POA: Diagnosis not present

## 2021-04-05 DIAGNOSIS — D511 Vitamin B12 deficiency anemia due to selective vitamin B12 malabsorption with proteinuria: Secondary | ICD-10-CM | POA: Diagnosis not present

## 2021-05-08 DIAGNOSIS — D511 Vitamin B12 deficiency anemia due to selective vitamin B12 malabsorption with proteinuria: Secondary | ICD-10-CM | POA: Diagnosis not present

## 2021-06-09 DIAGNOSIS — D519 Vitamin B12 deficiency anemia, unspecified: Secondary | ICD-10-CM | POA: Diagnosis not present

## 2021-06-27 DIAGNOSIS — N3946 Mixed incontinence: Secondary | ICD-10-CM | POA: Diagnosis not present

## 2021-06-29 DIAGNOSIS — E663 Overweight: Secondary | ICD-10-CM | POA: Diagnosis not present

## 2021-06-29 DIAGNOSIS — Z6826 Body mass index (BMI) 26.0-26.9, adult: Secondary | ICD-10-CM | POA: Diagnosis not present

## 2021-06-29 DIAGNOSIS — F419 Anxiety disorder, unspecified: Secondary | ICD-10-CM | POA: Diagnosis not present

## 2021-07-08 DIAGNOSIS — Z20822 Contact with and (suspected) exposure to covid-19: Secondary | ICD-10-CM | POA: Diagnosis not present

## 2021-07-17 DIAGNOSIS — Z20822 Contact with and (suspected) exposure to covid-19: Secondary | ICD-10-CM | POA: Diagnosis not present

## 2021-07-25 DIAGNOSIS — Z1231 Encounter for screening mammogram for malignant neoplasm of breast: Secondary | ICD-10-CM | POA: Diagnosis not present

## 2021-08-03 DIAGNOSIS — D511 Vitamin B12 deficiency anemia due to selective vitamin B12 malabsorption with proteinuria: Secondary | ICD-10-CM | POA: Diagnosis not present

## 2021-08-11 DIAGNOSIS — Z20822 Contact with and (suspected) exposure to covid-19: Secondary | ICD-10-CM | POA: Diagnosis not present

## 2021-08-19 DIAGNOSIS — Z20822 Contact with and (suspected) exposure to covid-19: Secondary | ICD-10-CM | POA: Diagnosis not present

## 2021-08-31 DIAGNOSIS — H0102A Squamous blepharitis right eye, upper and lower eyelids: Secondary | ICD-10-CM | POA: Diagnosis not present

## 2021-08-31 DIAGNOSIS — H0102B Squamous blepharitis left eye, upper and lower eyelids: Secondary | ICD-10-CM | POA: Diagnosis not present

## 2021-08-31 DIAGNOSIS — H04123 Dry eye syndrome of bilateral lacrimal glands: Secondary | ICD-10-CM | POA: Diagnosis not present

## 2021-08-31 DIAGNOSIS — H1045 Other chronic allergic conjunctivitis: Secondary | ICD-10-CM | POA: Diagnosis not present

## 2021-09-01 DIAGNOSIS — Z20822 Contact with and (suspected) exposure to covid-19: Secondary | ICD-10-CM | POA: Diagnosis not present

## 2021-09-06 DIAGNOSIS — Z20822 Contact with and (suspected) exposure to covid-19: Secondary | ICD-10-CM | POA: Diagnosis not present

## 2021-09-07 DIAGNOSIS — D511 Vitamin B12 deficiency anemia due to selective vitamin B12 malabsorption with proteinuria: Secondary | ICD-10-CM | POA: Diagnosis not present

## 2021-09-07 DIAGNOSIS — Q828 Other specified congenital malformations of skin: Secondary | ICD-10-CM | POA: Diagnosis not present

## 2021-09-16 DIAGNOSIS — Z20822 Contact with and (suspected) exposure to covid-19: Secondary | ICD-10-CM | POA: Diagnosis not present

## 2021-09-25 DIAGNOSIS — Z20822 Contact with and (suspected) exposure to covid-19: Secondary | ICD-10-CM | POA: Diagnosis not present

## 2021-10-05 DIAGNOSIS — L600 Ingrowing nail: Secondary | ICD-10-CM | POA: Diagnosis not present

## 2021-10-09 ENCOUNTER — Other Ambulatory Visit: Payer: Self-pay | Admitting: Internal Medicine

## 2021-10-10 DIAGNOSIS — D511 Vitamin B12 deficiency anemia due to selective vitamin B12 malabsorption with proteinuria: Secondary | ICD-10-CM | POA: Diagnosis not present

## 2021-10-11 ENCOUNTER — Other Ambulatory Visit: Payer: Self-pay | Admitting: Internal Medicine

## 2021-10-23 ENCOUNTER — Ambulatory Visit
Admission: EM | Admit: 2021-10-23 | Discharge: 2021-10-23 | Disposition: A | Discharge status: Home / Self Care | Payer: Medicare Other | Attending: Nurse Practitioner | Admitting: Nurse Practitioner

## 2021-10-23 DIAGNOSIS — R490 Dysphonia: Secondary | ICD-10-CM | POA: Diagnosis not present

## 2021-10-23 DIAGNOSIS — J309 Allergic rhinitis, unspecified: Secondary | ICD-10-CM | POA: Diagnosis not present

## 2021-10-23 DIAGNOSIS — H6593 Unspecified nonsuppurative otitis media, bilateral: Secondary | ICD-10-CM

## 2021-10-23 MED ORDER — FLUTICASONE PROPIONATE 50 MCG/ACT NA SUSP: 2.0000 | Freq: Every day | NASAL | 0 refills | Status: AC

## 2021-10-23 NOTE — Discharge Instructions (Addendum)
Take medication as prescribed. You may continue taking the Zyrtec you are currently taking.  Recommend taking that once daily, either at bedtime, or in the morning while your symptoms persist. Warm compresses to the affected areas to help with pain or discomfort. May continue taking Tylenol as needed to help with pain or discomfort. Voice rest to help with your hoarseness. Warm salt water gargles 3-4 times daily while symptoms persist. If your symptoms do not improve within the next 7 to 10 days, please follow-up for reevaluation.

## 2021-10-23 NOTE — ED Triage Notes (Signed)
Pt report bilateral era pain since lats night; hoarse since this morning.

## 2021-10-23 NOTE — ED Provider Notes (Signed)
[facility]    CSN: [id number] Arrival date & time: 10/23/21  1128      History   Chief Complaint Chief Complaint  Patient presents with   Otalgia    HPI Audrey [name] is a 81 y.o. female.   HPI Patient presents for bilateral ear pain that started 3 to 4 days ago and hoarseness when she woke up this morning.  Patient states that she traveled to the beach last week and mowed her lawn, her ear  pain started afterwards.  She states that she can feel the pain in the back of her ears as well.  She also states this morning when she woke up she was hoarse.  She states that she has noticed that she is clearing her throat a lot and has nasal congestion.  She denies fever, chills, headache, sore throat, cough, or GI symptoms.  She states she has been taking Tylenol for her symptoms.  States that she does have a history of seasonal allergies and takes Zyrtec occasionally.  Past Medical History:  Diagnosis Date   Allergic rhinitis    Anxiety and depression    Arthritis    Basal cell carcinoma 2002   Chronic lymphocytic thyroiditis    Chronic pain syndrome    Colon polyp    Depression    Diverticulosis    Dyspnea    with excertion   Erosive esophagitis    Family history of heart disease    Fibromyalgia    Gastric polyps    GERD (gastroesophageal reflux disease)    Hashimoto's disease    Hiatal hernia    History of nuclear stress test 04/2010   nonischemic    HLD (hyperlipidemia)    HTN (hypertension)    Hypothyroidism    Interstitial cystitis    Meniere's disease    Obesity    Pinched vertebral nerve    PONV (postoperative nausea and vomiting)    Raynaud's syndrome    Right hip pain 08/18/2012   Sleep apnea    Tuberculosis    postive skin test no symptoms  was around mom that had it   Vitamin B12 deficiency     Patient Active Problem List   Diagnosis Date Noted   Hiatal hernia    OSA (obstructive sleep apnea) 12/10/2018   Varicose veins of left lower  extremity with complications 29/93/7169   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   Presbycusis of both ears 03/06/2016   Adrenal adenoma, 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   Acquired hypothyroidism 09/24/2014   Recurrent UTI 09/22/2014   Cough 05/08/2013   Erosion of implanted vaginal mesh to surrounding organ or tissue ([place]) 05/05/2013   Essential hypertension 03/25/2013   Hyperlipidemia 03/25/2013   Nocturnal hypoxemia 03/05/2013   Lumbago 10/16/2012   Low back pain 08/18/2012   Right hip pain 08/18/2012   Overactive bladder 03/21/2012   S/P balloon dilatation of esophageal stricture 01/29/2012   Raynaud's disease 01/29/2012   Meniere disease 01/29/2012   Fibromyalgia 01/29/2012   Fecal incontinence 12/27/2011   Urge incontinence 12/27/2011   Atrophic vaginitis 12/27/2011   Family history of von Willebrand disease 06/26/2011   GERD 11/29/2009   Esophageal dysphagia 11/29/2009    Past Surgical History:  Procedure Laterality Date   ABDOMINAL HYSTERECTOMY  1982   BALLOON DILATION N/A 11/30/2019   Procedure: BALLOON DILATION;  Surgeon: Mauri Pole, MD;  Location: [facility];  Service: Endoscopy;  Laterality: N/A;   BASAL CELL CARCINOMA EXCISION  03-31-01   face  BLADDER REPAIR  2005   ovaries, tubes removed; pelvic prolapse corrected; rectal repair   BLADDER SURGERY  01-2009   with mesh placement   CATARACT EXTRACTION     bilateral   COLONOSCOPY W/ BIOPSIES AND POLYPECTOMY  12/20/2009   diverticulosis, adenomatous polyps   COLONOSCOPY WITH PROPOFOL N/A 08/26/2017   Procedure: COLONOSCOPY WITH PROPOFOL;  Surgeon: Gatha Mayer, MD;  Location: [facility];  Service: Endoscopy;  Laterality: N/A;   ENDOVENOUS ABLATION SAPHENOUS VEIN W/ LASER Left 08/07/2016   endovenous laser ablation left small saphenous vein by Tinnie Gens MD   ESOPHAGEAL MANOMETRY N/A 05/25/2013   Procedure: ESOPHAGEAL MANOMETRY (EM);  Surgeon: Gatha Mayer, MD;  Location: [facility];  Service: Endoscopy;   Laterality: N/A;   ESOPHAGOGASTRODUODENOSCOPY (EGD) WITH ESOPHAGEAL DILATION  06/09/2012   Procedure: ESOPHAGOGASTRODUODENOSCOPY (EGD) WITH ESOPHAGEAL DILATION;  Surgeon: Gatha Mayer, MD;  Location: [facility];  Service: Endoscopy;  Laterality: N/A;   ESOPHAGOGASTRODUODENOSCOPY (EGD) WITH PROPOFOL N/A 11/30/2019   Procedure: ESOPHAGOGASTRODUODENOSCOPY (EGD) WITH PROPOFOL;  Surgeon: Mauri Pole, MD;  Location: [facility];  Service: Endoscopy;  Laterality: N/A;   FOOT SURGERY  1999   bilateral   HAMMER TOE SURGERY Right 11/13/2017   Procedure: HAMMER TOE REPAIR RIGHT 4TH TOE AND 5TH TOE;  Surgeon: Caprice Beaver, DPM;  Location: [facility];  Service: Podiatry;  Laterality: Right;   INTERSTIM IMPLANT PLACEMENT  01/2012   [facility]   laser surgery bladder  05-25-10   on mesh   LIPOMA EXCISION     right arm   NASOLACRIMAL DUCT PROBING  09-12-10   right eye   NASOLACRIMAL DUCT PROBING  09-26-10   left eye   OSTECTOMY Right 11/13/2017   Procedure: OSTECTOMY RIGHT 5TH TOE;  Surgeon: Caprice Beaver, DPM;  Location: [facility];  Service: Podiatry;  Laterality: Right;   removal of bladder mesh  03-01-11   [facility]   TONSILLECTOMY  1959   TRANSTHORACIC ECHOCARDIOGRAM  12/2006   EF=>55%; LA mild-mod dilated; RA mildly dilated; mild mitral annular calcif, mild MR; mod TR & elevated RVSP; mild pulm vavle regurg   UPPER GASTROINTESTINAL ENDOSCOPY  12/20/2009   GERD    OB History   No obstetric history on file.      Home Medications    Prior to Admission medications   Medication Sig Start Date End Date Taking? Authorizing Provider  fluticasone (FLONASE) 50 MCG/ACT nasal spray Place 2 sprays into both nostrils daily. 10/23/21  Yes -Warren, Alda Lea, NP  alendronate (FOSAMAX) 70 MG tablet Take 70 mg by mouth every Sunday. Take with a full glass of water on an empty stomach.    [provider]  Calcium Carb-Cholecalciferol (CALCIUM+D3 PO) Take 1 tablet by mouth daily. 1200 mg  (Calcium)-25 mcg (D3)    [provider]  chlordiazePOXIDE (LIBRIUM) 10 MG capsule Take 10 mg by mouth 2 (two) times daily.     [provider]  Cholecalciferol (VITAMIN D3) 2000 UNITS TABS Take 2,000 Units by mouth at bedtime.     [provider]  Coenzyme Q10 200 MG capsule Take 200 mg by mouth daily.    [provider]  CRANBERRY PO Take 1 tablet by mouth daily.    [provider]  cyanocobalamin (,VITAMIN B-12,) 1000 MCG/ML injection Inject 1,000 mcg into the muscle every 30 (thirty) days. Patient not taking: Reported on 09/14/2020    [provider]  Flaxseed, Linseed, (FLAX SEED OIL) 1300 MG CAPS Take 1,300 mg  by mouth 2 (two) times daily. Omega 3    [provider]  furosemide (LASIX) 40 MG tablet Take 40 mg by mouth daily.     [provider]  gabapentin (NEURONTIN) 600 MG tablet Take 600 mg by mouth 2 (two) times daily.     [provider]  Javier Docker Oil 500 MG CAPS Take 500 mg by mouth daily.    [provider]  levothyroxine (SYNTHROID, LEVOTHROID) 50 MCG tablet Take 50 mcg by mouth daily before breakfast.     [provider]  losartan-hydrochlorothiazide (HYZAAR) 100-25 MG per tablet Take 1 tablet by mouth daily.  02/24/13   [provider]  OXYGEN Inhale 2 L into the lungs at bedtime.     [provider]  pantoprazole (PROTONIX) 40 MG tablet TAKE 1 TABLET BY MOUTH TWICE DAILY BEFORE BREAKFAST AND DINNER 10/10/21   Gatha Mayer, MD  Polyethyl Glycol-Propyl Glycol (SYSTANE) 0.4-0.3 % SOLN Place 1 drop into both eyes daily as needed (Dry eye).    [provider]  potassium chloride SA (K-DUR,KLOR-CON) 20 MEQ tablet Take 40 mEq by mouth 2 (two) times daily.     [provider]  PRESCRIPTION MEDICATION Place 1 application vaginally 2 (two) times a week. Custom Compound  Estradiol 0.'1mg'$ /Ml Vb Cream - Vaginal PERL    [provider]  rosuvastatin  (CRESTOR) 5 MG tablet Take 5 mg by mouth every Monday, Wednesday, and Friday. In the morning.    [provider]  SAVELLA 100 MG TABS tablet Take 100 mg by mouth 2 (two) times daily. 10/11/17   [provider]  triamcinolone ointment (KENALOG) 0.1 % Apply 1 application topically daily as needed for itching. Patient not taking: Reported on 09/14/2020 07/07/19   [provider]  VENTOLIN HFA 108 (90 Base) MCG/ACT inhaler Inhale 2 puffs into the lungs every 6 (six) hours as needed. 06/23/19   [provider]    Family History Family History  Problem Relation Age of Onset   Pancreatic cancer Mother    Diabetes Mother    Tuberculosis Mother    Prostate cancer Father    Colon polyps Father    Diabetes Father    Hyperlipidemia Father    Hypertension Father        also kidney disease   Heart disease Father        CABG, twice   Diabetes Maternal Grandmother    Colon cancer Maternal Grandfather    Clotting disorder Paternal Grandmother        blood disorder   Heart disease Paternal Grandfather    Stroke Brother    Diabetes Brother    Diabetes Brother    Stroke Brother    Breast cancer Maternal Aunt        x 4   Colon cancer Maternal Uncle    Von Willebrand disease Daughter    Von Willebrand disease Daughter    Hypertension Son    Crohn's disease Grandchild    Clotting disorder Grandchild        x3    Social History Social History   Tobacco Use   Smoking status: Never   Smokeless tobacco: Never  Vaping Use   Vaping Use: Never used  Substance Use Topics   Alcohol use: No   Drug use: No     Allergies   Erythromycin, Other, Statins, Sulfa antibiotics, Tramadol, Vytorin [ezetimibe-simvastatin], and Adhesive [tape]   Review of Systems Review of Systems Per HPI  Physical Exam Triage Vital Signs ED Triage Vitals  Enc Vitals Group     BP 10/23/21 1249 (!) 157/80     Pulse Rate 10/23/21 1249 91     Resp 10/23/21 1249 (!) 22     Temp  10/23/21 1249 99.5 F (37.5 C)     Temp Source 10/23/21 1249 Oral     SpO2 10/23/21 1249 91 %     Weight --      Height --      Head Circumference --      Peak Flow --      Pain Score 10/23/21 1248 2     Pain Loc --      Pain Edu? --      Excl. in [place]? --    No data found.  Updated Vital Signs BP (!) 157/80 (BP Location: Right Arm)   Pulse 91   Temp 99.5 F (37.5 C) (Oral)   Resp (!) 22   SpO2 91%   Visual Acuity Right Eye Distance:   Left Eye Distance:   Bilateral Distance:    Right Eye Near:   Left Eye Near:    Bilateral Near:     Physical Exam Vitals and nursing note reviewed.  Constitutional:      Appearance: Normal appearance.  HENT:     Head: Normocephalic.     Right Ear: Ear canal and external ear normal. A middle ear effusion is present.     Left Ear: Ear canal and external ear normal. A middle ear effusion is present.     Nose: Congestion present.     Mouth/Throat:     Lips: Pink.     Mouth: Mucous membranes are moist.     Pharynx: Posterior oropharyngeal erythema present.     Tonsils: No tonsillar exudate. 1+ on the right. 1+ on the left.  Eyes:     Extraocular Movements: Extraocular movements intact.     Conjunctiva/sclera: Conjunctivae normal.     Pupils: Pupils are equal, round, and reactive to light.  Cardiovascular:     Rate and Rhythm: Normal rate and regular rhythm.     Pulses: Normal pulses.     Heart sounds: Normal heart sounds.  Pulmonary:     Effort: Pulmonary effort is normal.     Breath sounds: Normal breath sounds.  Abdominal:     General: Bowel sounds are normal.     Palpations: Abdomen is soft.  Musculoskeletal:     Cervical back: Normal range of motion.  Lymphadenopathy:     Cervical: No cervical adenopathy.  Skin:    General: Skin is warm and dry.  Neurological:     General: No focal deficit present.     Mental Status: She is alert and oriented to person, place, and time.  Psychiatric:        Mood and Affect: Mood  normal.        Behavior: Behavior normal.     UC Treatments / Results  Labs (all labs ordered are listed, but only abnormal results are displayed) Labs Reviewed - No data to display  EKG   Radiology No results found.  Procedures Procedures (including critical care time)  Medications Ordered in UC Medications - No data to display  Initial Impression / Assessment and Plan / UC Course  I have reviewed the triage vital signs and the nursing notes.  Pertinent labs & imaging results that were available during my care of the patient were reviewed by me and  considered in my medical decision making (see chart for details).  Patient presents with bilateral ear pain and hoarseness.  Ear pain has been present over the past 3 to 4 days after patient went to the beach and mowed her lawn.  Hoarseness started this morning upon wakening.  On exam, patient has bilateral middle ear effusion, her throat is red, but no tonsillar swelling or exudate is present.  She is clearing her throat consistently during the exam, consistent with postnasal drainage..  Symptoms are consistent with allergic rhinitis.  Will provide symptomatic treatment for the patient with fluticasone.  Advised patient to continue taking the Zyrtec she currently has.  Patient advised to see if her symptoms improve after 7 to 10 days, if they do not, recommend following up with our clinic or with her PCP.  Supportive care recommendations were also provided.  Final Clinical Impressions(s) / UC Diagnoses   Final diagnoses:  Middle ear effusion, bilateral  Allergic rhinitis, unspecified seasonality, unspecified trigger  Hoarseness of voice     Discharge Instructions      Take medication as prescribed. You may continue taking the Zyrtec you are currently taking.  Recommend taking that once daily, either at bedtime, or in the morning while your symptoms persist. Warm compresses to the affected areas to help with pain or  discomfort. May continue taking Tylenol as needed to help with pain or discomfort. Voice rest to help with your hoarseness. Warm salt water gargles 3-4 times daily while symptoms persist. If your symptoms do not improve within the next 7 to 10 days, please follow-up for reevaluation.     ED Prescriptions     Medication Sig Dispense Auth. Provider   fluticasone (FLONASE) 50 MCG/ACT nasal spray Place 2 sprays into both nostrils daily. 16 g -Warren, Alda Lea, NP      PDMP not reviewed this encounter.   Tish Men, NP 10/23/21 1321

## 2021-10-25 DIAGNOSIS — J329 Chronic sinusitis, unspecified: Secondary | ICD-10-CM | POA: Diagnosis not present

## 2021-10-25 DIAGNOSIS — E063 Autoimmune thyroiditis: Secondary | ICD-10-CM | POA: Diagnosis not present

## 2021-10-25 DIAGNOSIS — E663 Overweight: Secondary | ICD-10-CM | POA: Diagnosis not present

## 2021-10-25 DIAGNOSIS — Z6825 Body mass index (BMI) 25.0-25.9, adult: Secondary | ICD-10-CM | POA: Diagnosis not present

## 2021-10-25 DIAGNOSIS — I10 Essential (primary) hypertension: Secondary | ICD-10-CM | POA: Diagnosis not present

## 2021-10-25 DIAGNOSIS — D511 Vitamin B12 deficiency anemia due to selective vitamin B12 malabsorption with proteinuria: Secondary | ICD-10-CM | POA: Diagnosis not present

## 2021-11-10 ENCOUNTER — Ambulatory Visit (HOSPITAL_COMMUNITY)
Admission: RE | Admit: 2021-11-10 | Discharge: 2021-11-10 | Disposition: A | Discharge status: Home / Self Care | Payer: Medicare Other | Source: Ambulatory Visit | Attending: Family Medicine | Admitting: Family Medicine

## 2021-11-10 ENCOUNTER — Other Ambulatory Visit (HOSPITAL_COMMUNITY): Payer: Self-pay | Admitting: Family Medicine

## 2021-11-10 DIAGNOSIS — K449 Diaphragmatic hernia without obstruction or gangrene: Secondary | ICD-10-CM | POA: Diagnosis not present

## 2021-11-10 DIAGNOSIS — J069 Acute upper respiratory infection, unspecified: Secondary | ICD-10-CM | POA: Insufficient documentation

## 2021-11-10 DIAGNOSIS — Z6825 Body mass index (BMI) 25.0-25.9, adult: Secondary | ICD-10-CM | POA: Diagnosis not present

## 2021-11-24 DIAGNOSIS — D511 Vitamin B12 deficiency anemia due to selective vitamin B12 malabsorption with proteinuria: Secondary | ICD-10-CM | POA: Diagnosis not present

## 2021-11-29 ENCOUNTER — Other Ambulatory Visit (HOSPITAL_COMMUNITY): Payer: Self-pay | Admitting: Family Medicine

## 2021-11-29 DIAGNOSIS — J189 Pneumonia, unspecified organism: Secondary | ICD-10-CM

## 2021-12-01 ENCOUNTER — Ambulatory Visit (HOSPITAL_COMMUNITY)
Admission: RE | Admit: 2021-12-01 | Discharge: 2021-12-01 | Disposition: A | Discharge status: Home / Self Care | Payer: Medicare Other | Source: Ambulatory Visit | Attending: Family Medicine | Admitting: Family Medicine

## 2021-12-01 DIAGNOSIS — J189 Pneumonia, unspecified organism: Secondary | ICD-10-CM | POA: Diagnosis not present

## 2021-12-01 DIAGNOSIS — R059 Cough, unspecified: Secondary | ICD-10-CM | POA: Diagnosis not present

## 2021-12-01 DIAGNOSIS — K449 Diaphragmatic hernia without obstruction or gangrene: Secondary | ICD-10-CM | POA: Diagnosis not present

## 2021-12-04 ENCOUNTER — Other Ambulatory Visit (HOSPITAL_COMMUNITY): Payer: Self-pay | Admitting: Family Medicine

## 2021-12-04 DIAGNOSIS — J189 Pneumonia, unspecified organism: Secondary | ICD-10-CM

## 2021-12-21 ENCOUNTER — Ambulatory Visit (HOSPITAL_COMMUNITY)
Admission: RE | Admit: 2021-12-21 | Discharge: 2021-12-21 | Disposition: A | Discharge status: Home / Self Care | Payer: Medicare Other | Source: Ambulatory Visit | Attending: Family Medicine | Admitting: Family Medicine

## 2021-12-21 DIAGNOSIS — J189 Pneumonia, unspecified organism: Secondary | ICD-10-CM | POA: Insufficient documentation

## 2021-12-26 ENCOUNTER — Other Ambulatory Visit (HOSPITAL_COMMUNITY): Payer: Self-pay | Admitting: Family Medicine

## 2021-12-26 DIAGNOSIS — N3946 Mixed incontinence: Secondary | ICD-10-CM | POA: Diagnosis not present

## 2021-12-26 DIAGNOSIS — R350 Frequency of micturition: Secondary | ICD-10-CM | POA: Diagnosis not present

## 2021-12-26 DIAGNOSIS — J189 Pneumonia, unspecified organism: Secondary | ICD-10-CM

## 2021-12-29 DIAGNOSIS — D511 Vitamin B12 deficiency anemia due to selective vitamin B12 malabsorption with proteinuria: Secondary | ICD-10-CM | POA: Diagnosis not present

## 2022-01-10 ENCOUNTER — Ambulatory Visit (HOSPITAL_COMMUNITY)
Admission: RE | Admit: 2022-01-10 | Discharge: 2022-01-10 | Disposition: A | Discharge status: Home / Self Care | Payer: Medicare Other | Source: Ambulatory Visit | Attending: Family Medicine | Admitting: Family Medicine

## 2022-01-10 DIAGNOSIS — J9809 Other diseases of bronchus, not elsewhere classified: Secondary | ICD-10-CM | POA: Diagnosis not present

## 2022-01-10 DIAGNOSIS — J189 Pneumonia, unspecified organism: Secondary | ICD-10-CM | POA: Diagnosis not present

## 2022-01-10 DIAGNOSIS — J479 Bronchiectasis, uncomplicated: Secondary | ICD-10-CM | POA: Diagnosis not present

## 2022-01-23 DIAGNOSIS — J329 Chronic sinusitis, unspecified: Secondary | ICD-10-CM | POA: Diagnosis not present

## 2022-01-23 DIAGNOSIS — W57XXXA Bitten or stung by nonvenomous insect and other nonvenomous arthropods, initial encounter: Secondary | ICD-10-CM | POA: Diagnosis not present

## 2022-01-23 DIAGNOSIS — Z6824 Body mass index (BMI) 24.0-24.9, adult: Secondary | ICD-10-CM | POA: Diagnosis not present

## 2022-01-29 DIAGNOSIS — L820 Inflamed seborrheic keratosis: Secondary | ICD-10-CM | POA: Diagnosis not present

## 2022-01-29 DIAGNOSIS — D1801 Hemangioma of skin and subcutaneous tissue: Secondary | ICD-10-CM | POA: Diagnosis not present

## 2022-01-29 DIAGNOSIS — L821 Other seborrheic keratosis: Secondary | ICD-10-CM | POA: Diagnosis not present

## 2022-01-29 DIAGNOSIS — L814 Other melanin hyperpigmentation: Secondary | ICD-10-CM | POA: Diagnosis not present

## 2022-01-29 DIAGNOSIS — L538 Other specified erythematous conditions: Secondary | ICD-10-CM | POA: Diagnosis not present

## 2022-01-29 DIAGNOSIS — Z789 Other specified health status: Secondary | ICD-10-CM | POA: Diagnosis not present

## 2022-01-29 DIAGNOSIS — L570 Actinic keratosis: Secondary | ICD-10-CM | POA: Diagnosis not present

## 2022-01-29 DIAGNOSIS — Z08 Encounter for follow-up examination after completed treatment for malignant neoplasm: Secondary | ICD-10-CM | POA: Diagnosis not present

## 2022-01-29 DIAGNOSIS — Z85828 Personal history of other malignant neoplasm of skin: Secondary | ICD-10-CM | POA: Diagnosis not present

## 2022-01-29 DIAGNOSIS — R208 Other disturbances of skin sensation: Secondary | ICD-10-CM | POA: Diagnosis not present

## 2022-01-31 DIAGNOSIS — D511 Vitamin B12 deficiency anemia due to selective vitamin B12 malabsorption with proteinuria: Secondary | ICD-10-CM | POA: Diagnosis not present

## 2022-01-31 DIAGNOSIS — Z23 Encounter for immunization: Secondary | ICD-10-CM | POA: Diagnosis not present

## 2022-02-14 DIAGNOSIS — E663 Overweight: Secondary | ICD-10-CM | POA: Diagnosis not present

## 2022-02-14 DIAGNOSIS — Z6825 Body mass index (BMI) 25.0-25.9, adult: Secondary | ICD-10-CM | POA: Diagnosis not present

## 2022-02-14 DIAGNOSIS — Z1331 Encounter for screening for depression: Secondary | ICD-10-CM | POA: Diagnosis not present

## 2022-02-14 DIAGNOSIS — E559 Vitamin D deficiency, unspecified: Secondary | ICD-10-CM | POA: Diagnosis not present

## 2022-02-14 DIAGNOSIS — E782 Mixed hyperlipidemia: Secondary | ICD-10-CM | POA: Diagnosis not present

## 2022-02-14 DIAGNOSIS — M797 Fibromyalgia: Secondary | ICD-10-CM | POA: Diagnosis not present

## 2022-02-14 DIAGNOSIS — F321 Major depressive disorder, single episode, moderate: Secondary | ICD-10-CM | POA: Diagnosis not present

## 2022-02-14 DIAGNOSIS — E119 Type 2 diabetes mellitus without complications: Secondary | ICD-10-CM | POA: Diagnosis not present

## 2022-02-14 DIAGNOSIS — I10 Essential (primary) hypertension: Secondary | ICD-10-CM | POA: Diagnosis not present

## 2022-02-14 DIAGNOSIS — G4730 Sleep apnea, unspecified: Secondary | ICD-10-CM | POA: Diagnosis not present

## 2022-02-14 DIAGNOSIS — E039 Hypothyroidism, unspecified: Secondary | ICD-10-CM | POA: Diagnosis not present

## 2022-02-14 DIAGNOSIS — M1991 Primary osteoarthritis, unspecified site: Secondary | ICD-10-CM | POA: Diagnosis not present

## 2022-02-14 DIAGNOSIS — Z0001 Encounter for general adult medical examination with abnormal findings: Secondary | ICD-10-CM | POA: Diagnosis not present

## 2022-02-14 DIAGNOSIS — D511 Vitamin B12 deficiency anemia due to selective vitamin B12 malabsorption with proteinuria: Secondary | ICD-10-CM | POA: Diagnosis not present

## 2022-02-14 DIAGNOSIS — K219 Gastro-esophageal reflux disease without esophagitis: Secondary | ICD-10-CM | POA: Diagnosis not present

## 2022-02-20 DIAGNOSIS — R8271 Bacteriuria: Secondary | ICD-10-CM | POA: Diagnosis not present

## 2022-02-20 DIAGNOSIS — N3000 Acute cystitis without hematuria: Secondary | ICD-10-CM | POA: Diagnosis not present

## 2022-03-01 DIAGNOSIS — H02835 Dermatochalasis of left lower eyelid: Secondary | ICD-10-CM | POA: Diagnosis not present

## 2022-03-01 DIAGNOSIS — H04123 Dry eye syndrome of bilateral lacrimal glands: Secondary | ICD-10-CM | POA: Diagnosis not present

## 2022-03-01 DIAGNOSIS — H1045 Other chronic allergic conjunctivitis: Secondary | ICD-10-CM | POA: Diagnosis not present

## 2022-03-01 DIAGNOSIS — H0102A Squamous blepharitis right eye, upper and lower eyelids: Secondary | ICD-10-CM | POA: Diagnosis not present

## 2022-03-01 DIAGNOSIS — H43813 Vitreous degeneration, bilateral: Secondary | ICD-10-CM | POA: Diagnosis not present

## 2022-03-01 DIAGNOSIS — H02832 Dermatochalasis of right lower eyelid: Secondary | ICD-10-CM | POA: Diagnosis not present

## 2022-03-01 DIAGNOSIS — H353111 Nonexudative age-related macular degeneration, right eye, early dry stage: Secondary | ICD-10-CM | POA: Diagnosis not present

## 2022-03-01 DIAGNOSIS — H0102B Squamous blepharitis left eye, upper and lower eyelids: Secondary | ICD-10-CM | POA: Diagnosis not present

## 2022-03-01 DIAGNOSIS — Z961 Presence of intraocular lens: Secondary | ICD-10-CM | POA: Diagnosis not present

## 2022-03-08 ENCOUNTER — Telehealth: Payer: Self-pay | Admitting: *Deleted

## 2022-03-08 NOTE — Chronic Care Management (AMB) (Signed)
  Care Coordination   Note   03/08/2022 Name: Audrey [name] MRN: [id number] DOB: 05-24-1940  [name] is a 81 y.o. year old female who sees Redmond School, MD for primary care. I reached out to Jannet Mantis by phone today to offer care coordination services.  [name] was given information about Care Coordination services today including:   The Care Coordination services include support from the care team which includes your Nurse Coordinator, Clinical Social Worker, or Pharmacist.  The Care Coordination team is here to help remove barriers to the health concerns and goals most important to you. Care Coordination services are voluntary, and the patient may decline or stop services at any time by request to their care team member.   Care Coordination Consent Status: Patient agreed to services and verbal consent obtained.   Follow up plan:  Telephone appointment with care coordination team member scheduled for:  03/09/22  Encounter Outcome:  Pt. Scheduled  [place]  Direct Dial: [phone]

## 2022-03-09 ENCOUNTER — Ambulatory Visit: Payer: Self-pay | Admitting: *Deleted

## 2022-03-09 NOTE — Patient Outreach (Signed)
  Care Coordination   Initial Visit Note   03/09/2022 Name: Audrey [name] MRN: [id number] DOB: 1941/01/23  Audrey [name] is a 81 y.o. year old female who sees Redmond School, MD for primary care. I spoke with  Jannet Mantis by phone today.  What matters to the patients health and wellness today?  State she is doing well with management of health.  Has adult children that come to visit, husband passed away about a month ago.  She provides assistance with health management to others in the community.  Does not feel the need for follow up but will call with questions.    Goals Addressed             This Visit's Progress    COMPLETED: Care Coordination Activities - No follow up needed       Care Coordination Interventions: Patient interviewed about adult health maintenance status including  Colonoscopy    Regular eye checkups Regular Dental Care    Blood Pressure    Advised patient to discuss  Pneumonia Vaccine Influenza Vaccine COVID vaccination    with primary care provider  Provided education about Cost of medications, referral placed to pharmacy team SDOH assessment complete         SDOH assessments and interventions completed:  Yes  SDOH Interventions Today    Flowsheet Row Most Recent Value  SDOH Interventions   Food Insecurity Interventions Intervention Not Indicated  Housing Interventions Intervention Not Indicated  Transportation Interventions Intervention Not Indicated  Utilities Interventions Intervention Not Indicated        Care Coordination Interventions Activated:  Yes  Care Coordination Interventions:  Yes, provided   Follow up plan: Referral made to pharmacy team    Encounter Outcome:  Pt. Visit Completed   Valente David, RN, MSN, [place] [facility] Care Management Coordinator [phone]

## 2022-03-09 NOTE — Patient Instructions (Signed)
Visit Information  Thank you for taking time to visit with me today. Please don't hesitate to contact me if I can be of assistance to you.  Following are the goals we discussed today:  Listen for call from pharmacy team with [facility]  Please call the Suicide and Crisis Lifeline: 988 call the [place] National Suicide Prevention Lifeline: 608-630-7040 or TTY: 351-661-0760 TTY (343) 077-3257) to talk to a trained counselor call 1-800-273-TALK (toll free, 24 hour hotline) call the [facility]: [phone] call 911 if you are experiencing a Mental Health or [place] or need someone to talk to.  Patient verbalizes understanding of instructions and care plan provided today and agrees to view in [facility]. Active MyChart status and patient understanding of how to access instructions and care plan via MyChart confirmed with patient.     The patient has been provided with contact information for the care management team and has been advised to call with any health related questions or concerns.   Valente David, [place], [place] Care Management Care Management Coordinator [phone]

## 2022-03-20 DIAGNOSIS — D511 Vitamin B12 deficiency anemia due to selective vitamin B12 malabsorption with proteinuria: Secondary | ICD-10-CM | POA: Diagnosis not present

## 2022-03-27 ENCOUNTER — Other Ambulatory Visit: Payer: Self-pay | Admitting: Internal Medicine

## 2022-03-27 DIAGNOSIS — N3000 Acute cystitis without hematuria: Secondary | ICD-10-CM | POA: Diagnosis not present

## 2022-03-27 DIAGNOSIS — N3946 Mixed incontinence: Secondary | ICD-10-CM | POA: Diagnosis not present

## 2022-04-05 DIAGNOSIS — N3020 Other chronic cystitis without hematuria: Secondary | ICD-10-CM | POA: Diagnosis not present

## 2022-04-23 DIAGNOSIS — D519 Vitamin B12 deficiency anemia, unspecified: Secondary | ICD-10-CM | POA: Diagnosis not present

## 2022-04-23 DIAGNOSIS — E663 Overweight: Secondary | ICD-10-CM | POA: Diagnosis not present

## 2022-04-23 DIAGNOSIS — M5030 Other cervical disc degeneration, unspecified cervical region: Secondary | ICD-10-CM | POA: Diagnosis not present

## 2022-04-23 DIAGNOSIS — M797 Fibromyalgia: Secondary | ICD-10-CM | POA: Diagnosis not present

## 2022-04-23 DIAGNOSIS — Z6825 Body mass index (BMI) 25.0-25.9, adult: Secondary | ICD-10-CM | POA: Diagnosis not present

## 2022-04-23 DIAGNOSIS — E119 Type 2 diabetes mellitus without complications: Secondary | ICD-10-CM | POA: Diagnosis not present

## 2022-04-23 DIAGNOSIS — M1991 Primary osteoarthritis, unspecified site: Secondary | ICD-10-CM | POA: Diagnosis not present

## 2022-04-26 DIAGNOSIS — L900 Lichen sclerosus et atrophicus: Secondary | ICD-10-CM | POA: Diagnosis not present

## 2022-04-26 DIAGNOSIS — N3020 Other chronic cystitis without hematuria: Secondary | ICD-10-CM | POA: Diagnosis not present

## 2022-05-15 DIAGNOSIS — H35033 Hypertensive retinopathy, bilateral: Secondary | ICD-10-CM | POA: Diagnosis not present

## 2022-05-15 DIAGNOSIS — Z961 Presence of intraocular lens: Secondary | ICD-10-CM | POA: Diagnosis not present

## 2022-05-15 DIAGNOSIS — H43813 Vitreous degeneration, bilateral: Secondary | ICD-10-CM | POA: Diagnosis not present

## 2022-05-15 DIAGNOSIS — H353132 Nonexudative age-related macular degeneration, bilateral, intermediate dry stage: Secondary | ICD-10-CM | POA: Diagnosis not present

## 2022-05-15 DIAGNOSIS — H209 Unspecified iridocyclitis: Secondary | ICD-10-CM | POA: Diagnosis not present

## 2022-05-22 DIAGNOSIS — M79671 Pain in right foot: Secondary | ICD-10-CM | POA: Diagnosis not present

## 2022-05-22 DIAGNOSIS — D828 Immunodeficiency associated with other specified major defects: Secondary | ICD-10-CM | POA: Diagnosis not present

## 2022-05-22 DIAGNOSIS — M2041 Other hammer toe(s) (acquired), right foot: Secondary | ICD-10-CM | POA: Diagnosis not present

## 2022-05-29 DIAGNOSIS — D511 Vitamin B12 deficiency anemia due to selective vitamin B12 malabsorption with proteinuria: Secondary | ICD-10-CM | POA: Diagnosis not present

## 2022-06-19 DIAGNOSIS — L298 Other pruritus: Secondary | ICD-10-CM | POA: Diagnosis not present

## 2022-06-19 DIAGNOSIS — L538 Other specified erythematous conditions: Secondary | ICD-10-CM | POA: Diagnosis not present

## 2022-06-19 DIAGNOSIS — L820 Inflamed seborrheic keratosis: Secondary | ICD-10-CM | POA: Diagnosis not present

## 2022-06-19 DIAGNOSIS — Z789 Other specified health status: Secondary | ICD-10-CM | POA: Diagnosis not present

## 2022-06-19 DIAGNOSIS — L570 Actinic keratosis: Secondary | ICD-10-CM | POA: Diagnosis not present

## 2022-06-27 DIAGNOSIS — D511 Vitamin B12 deficiency anemia due to selective vitamin B12 malabsorption with proteinuria: Secondary | ICD-10-CM | POA: Diagnosis not present

## 2022-07-20 DIAGNOSIS — W57XXXA Bitten or stung by nonvenomous insect and other nonvenomous arthropods, initial encounter: Secondary | ICD-10-CM | POA: Diagnosis not present

## 2022-07-20 DIAGNOSIS — R21 Rash and other nonspecific skin eruption: Secondary | ICD-10-CM | POA: Diagnosis not present

## 2022-07-20 DIAGNOSIS — E663 Overweight: Secondary | ICD-10-CM | POA: Diagnosis not present

## 2022-07-20 DIAGNOSIS — Z6825 Body mass index (BMI) 25.0-25.9, adult: Secondary | ICD-10-CM | POA: Diagnosis not present

## 2022-07-31 DIAGNOSIS — Z1231 Encounter for screening mammogram for malignant neoplasm of breast: Secondary | ICD-10-CM | POA: Diagnosis not present

## 2022-08-09 DIAGNOSIS — M79671 Pain in right foot: Secondary | ICD-10-CM | POA: Diagnosis not present

## 2022-08-09 DIAGNOSIS — M7741 Metatarsalgia, right foot: Secondary | ICD-10-CM | POA: Diagnosis not present

## 2022-08-09 DIAGNOSIS — M79672 Pain in left foot: Secondary | ICD-10-CM | POA: Diagnosis not present

## 2022-08-09 DIAGNOSIS — M722 Plantar fascial fibromatosis: Secondary | ICD-10-CM | POA: Diagnosis not present

## 2022-08-22 DIAGNOSIS — D511 Vitamin B12 deficiency anemia due to selective vitamin B12 malabsorption with proteinuria: Secondary | ICD-10-CM | POA: Diagnosis not present

## 2022-10-04 DIAGNOSIS — D519 Vitamin B12 deficiency anemia, unspecified: Secondary | ICD-10-CM | POA: Diagnosis not present

## 2022-10-04 DIAGNOSIS — D511 Vitamin B12 deficiency anemia due to selective vitamin B12 malabsorption with proteinuria: Secondary | ICD-10-CM | POA: Diagnosis not present

## 2022-10-12 ENCOUNTER — Other Ambulatory Visit: Payer: Self-pay | Admitting: Internal Medicine

## 2022-10-16 ENCOUNTER — Telehealth: Payer: Self-pay | Admitting: Internal Medicine

## 2022-10-16 MED ORDER — PANTOPRAZOLE SODIUM 40 MG PO TBEC: 40.0000 mg | DELAYED_RELEASE_TABLET | Freq: Two times a day (BID) | ORAL | 1 refills | Status: DC

## 2022-10-16 NOTE — Telephone Encounter (Signed)
I spoke with Audrey [name] and confirmed her pharmacy. She has lost weight in the past several years and that has helped her GERD she said. She can get away with one pantoprazole daily but she request I send it in as Dr Leone Payor originally rx'ed it in case she needs it BID. She has a September appointment.

## 2022-10-16 NOTE — Telephone Encounter (Signed)
Inbound call for patient requesting a refill for pantoprazole.She is schedule to follow up 9/20

## 2022-10-23 DIAGNOSIS — N3946 Mixed incontinence: Secondary | ICD-10-CM | POA: Diagnosis not present

## 2022-10-23 DIAGNOSIS — N3020 Other chronic cystitis without hematuria: Secondary | ICD-10-CM | POA: Diagnosis not present

## 2022-11-21 DIAGNOSIS — E039 Hypothyroidism, unspecified: Secondary | ICD-10-CM | POA: Diagnosis not present

## 2022-11-21 DIAGNOSIS — D511 Vitamin B12 deficiency anemia due to selective vitamin B12 malabsorption with proteinuria: Secondary | ICD-10-CM | POA: Diagnosis not present

## 2022-11-21 DIAGNOSIS — M5030 Other cervical disc degeneration, unspecified cervical region: Secondary | ICD-10-CM | POA: Diagnosis not present

## 2022-11-21 DIAGNOSIS — E663 Overweight: Secondary | ICD-10-CM | POA: Diagnosis not present

## 2022-11-21 DIAGNOSIS — J329 Chronic sinusitis, unspecified: Secondary | ICD-10-CM | POA: Diagnosis not present

## 2022-11-21 DIAGNOSIS — G4730 Sleep apnea, unspecified: Secondary | ICD-10-CM | POA: Diagnosis not present

## 2022-11-21 DIAGNOSIS — Z6825 Body mass index (BMI) 25.0-25.9, adult: Secondary | ICD-10-CM | POA: Diagnosis not present

## 2022-11-21 DIAGNOSIS — E063 Autoimmune thyroiditis: Secondary | ICD-10-CM | POA: Diagnosis not present

## 2022-11-21 DIAGNOSIS — I10 Essential (primary) hypertension: Secondary | ICD-10-CM | POA: Diagnosis not present

## 2022-12-04 DIAGNOSIS — J302 Other seasonal allergic rhinitis: Secondary | ICD-10-CM | POA: Diagnosis not present

## 2022-12-04 DIAGNOSIS — H6523 Chronic serous otitis media, bilateral: Secondary | ICD-10-CM | POA: Diagnosis not present

## 2022-12-04 DIAGNOSIS — Z6825 Body mass index (BMI) 25.0-25.9, adult: Secondary | ICD-10-CM | POA: Diagnosis not present

## 2022-12-04 DIAGNOSIS — R6889 Other general symptoms and signs: Secondary | ICD-10-CM | POA: Diagnosis not present

## 2022-12-04 DIAGNOSIS — Z20828 Contact with and (suspected) exposure to other viral communicable diseases: Secondary | ICD-10-CM | POA: Diagnosis not present

## 2022-12-04 DIAGNOSIS — E663 Overweight: Secondary | ICD-10-CM | POA: Diagnosis not present

## 2023-01-04 DIAGNOSIS — Z6825 Body mass index (BMI) 25.0-25.9, adult: Secondary | ICD-10-CM | POA: Diagnosis not present

## 2023-01-04 DIAGNOSIS — J302 Other seasonal allergic rhinitis: Secondary | ICD-10-CM | POA: Diagnosis not present

## 2023-01-04 DIAGNOSIS — D511 Vitamin B12 deficiency anemia due to selective vitamin B12 malabsorption with proteinuria: Secondary | ICD-10-CM | POA: Diagnosis not present

## 2023-01-04 DIAGNOSIS — H6523 Chronic serous otitis media, bilateral: Secondary | ICD-10-CM | POA: Diagnosis not present

## 2023-01-25 DIAGNOSIS — D511 Vitamin B12 deficiency anemia due to selective vitamin B12 malabsorption with proteinuria: Secondary | ICD-10-CM | POA: Diagnosis not present

## 2023-01-30 DIAGNOSIS — L570 Actinic keratosis: Secondary | ICD-10-CM | POA: Diagnosis not present

## 2023-01-30 DIAGNOSIS — L814 Other melanin hyperpigmentation: Secondary | ICD-10-CM | POA: Diagnosis not present

## 2023-01-30 DIAGNOSIS — D225 Melanocytic nevi of trunk: Secondary | ICD-10-CM | POA: Diagnosis not present

## 2023-01-30 DIAGNOSIS — Z08 Encounter for follow-up examination after completed treatment for malignant neoplasm: Secondary | ICD-10-CM | POA: Diagnosis not present

## 2023-01-30 DIAGNOSIS — Z85828 Personal history of other malignant neoplasm of skin: Secondary | ICD-10-CM | POA: Diagnosis not present

## 2023-01-30 DIAGNOSIS — L821 Other seborrheic keratosis: Secondary | ICD-10-CM | POA: Diagnosis not present

## 2023-02-07 DIAGNOSIS — J019 Acute sinusitis, unspecified: Secondary | ICD-10-CM | POA: Diagnosis not present

## 2023-02-07 DIAGNOSIS — R519 Headache, unspecified: Secondary | ICD-10-CM | POA: Diagnosis not present

## 2023-02-08 ENCOUNTER — Ambulatory Visit: Payer: PPO | Admitting: Internal Medicine

## 2023-02-08 ENCOUNTER — Encounter: Payer: Self-pay | Admitting: Internal Medicine

## 2023-02-08 VITALS — BP 124/80 | HR 92 | Ht 60.0 in | Wt 136.0 lb

## 2023-02-08 DIAGNOSIS — K222 Esophageal obstruction: Secondary | ICD-10-CM

## 2023-02-08 DIAGNOSIS — K219 Gastro-esophageal reflux disease without esophagitis: Secondary | ICD-10-CM | POA: Diagnosis not present

## 2023-02-08 DIAGNOSIS — R1032 Left lower quadrant pain: Secondary | ICD-10-CM

## 2023-02-08 DIAGNOSIS — K449 Diaphragmatic hernia without obstruction or gangrene: Secondary | ICD-10-CM | POA: Diagnosis not present

## 2023-02-08 NOTE — Patient Instructions (Signed)
_______________________________________________________  If your blood pressure at your visit was 140/90 or greater, please contact your primary care physician to follow up on this.  _______________________________________________________  If you are age 82 or older, your body mass index should be between 23-30. Your Body mass index is 26.56 kg/m. If this is out of the aforementioned range listed, please consider follow up with your Primary Care Provider.  If you are age 70 or younger, your body mass index should be between 19-25. Your Body mass index is 26.56 kg/m. If this is out of the aformentioned range listed, please consider follow up with your Primary Care Provider.   ________________________________________________________  The [facility] providers would like to encourage you to use [facility] to communicate with providers for non-urgent requests or questions.  Due to long hold times on the telephone, sending your provider a message by [facility] may be a faster and more efficient way to get a response.  Please allow 48 business hours for a response.  Please remember that this is for non-urgent requests.  _______________________________________________________  If the Left lower quadrant pain gets worse or you have questions please call us.  Follow up as needed.  It was a pleasure to see you today!  Thank you for trusting me with your gastrointestinal care!

## 2023-02-08 NOTE — Progress Notes (Signed)
Audrey [name] 81 y.o. Dec 19, 1940 [id number]  Assessment & Plan:   Encounter Diagnoses  Name Primary?   GERD with stricture Yes   Hiatal hernia    LLQ pain     Continue daily PPI for GERD issues, use twice daily if needed.  She may request refill through primary care.  I can see her back but if she is stable and just needs refills not necessary to see the specialist.  She is advised to take 1 pill at a time to avoid pill dysphagia.  Regarding left lower quadrant pain.  Cause not clear it stable it is not disrupting life it has been there for a while so she will monitor.    Subjective:   Chief Complaint: GERD follow-up and left lower quadrant pain  HPI 82 year old woman with chronic nocturnal oxygen therapy, fibromyalgia, hypertension, Raynaud's syndrome, and GERD with dysphagia here for follow-up.  PPI was refilled in the spring.  She is using it daily for the most part though we have prescribed twice daily.  Last EGD 2021 with a 5 cm hiatal hernia grade B esophagitis and distal esophageal stenosis/stricture dilated to 20 mm without change.  Sometimes she has trouble swallowing pills she will take 2 or 3 at a time but in general manages without dysphagia and is not having significant heartburn.  She intentionally lost weight and that has made a big difference.   She describes some chronic left lower quadrant discomfort that is a nuisance and would like me to check it.  There is no bowel habit change or other associated symptoms.  She does have some chronic back pain and disc issues and scoliosis.   CT chest 01/12/2022 IMPRESSION: 1. Mild atelectasis/scarring in the right middle lobe and left lower lobe in areas of mild cylindrical bronchiectasis. 2. No airspace consolidation suspicious for pneumonia. 3. Mild diffuse patchy interstitial prominence throughout both lungs. This could be chronic, inflammatory or viral in nature. 4. Moderate to large-sized hiatal hernia. 5. Mild  cardiomegaly. 6.  Calcific coronary artery and aortic atherosclerosis.     Allergies  Allergen Reactions   Erythromycin Diarrhea   Other Hives and Other (See Comments)    Allergen - EKG electrodes   Statins Other (See Comments)    Makes her hurt bad   Sulfa Antibiotics Hives, Nausea Only and Rash   Tramadol Nausea And Vomiting and Other (See Comments)    medicine has her dizzy and nauseated    Vytorin [Ezetimibe-Simvastatin] Hives   Adhesive [Tape] Rash   Current Meds  Medication Sig   Calcium Carb-Cholecalciferol (CALCIUM+D3 PO) Take 1 tablet by mouth daily. 1200 mg (Calcium)-25 mcg (D3)   chlordiazePOXIDE (LIBRIUM) 10 MG capsule Take 10 mg by mouth 2 (two) times daily.    Cholecalciferol (VITAMIN D3) 2000 UNITS TABS Take 2,000 Units by mouth at bedtime.    cyanocobalamin (,VITAMIN B-12,) 1000 MCG/ML injection Inject 1,000 mcg into the muscle every 30 (thirty) days.   fluticasone (FLONASE) 50 MCG/ACT nasal spray Place 2 sprays into both nostrils daily.   furosemide (LASIX) 40 MG tablet Take 40 mg by mouth daily.    gabapentin (NEURONTIN) 600 MG tablet Take 600 mg by mouth 2 (two) times daily.    levothyroxine (SYNTHROID, LEVOTHROID) 50 MCG tablet Take 50 mcg by mouth daily before breakfast.    OXYGEN Inhale 2 L into the lungs at bedtime.    pantoprazole (PROTONIX) 40 MG tablet Take 1 tablet (40 mg total) by mouth 2 (two)  times daily before a meal.   Polyethyl Glycol-Propyl Glycol (SYSTANE) 0.4-0.3 % SOLN Place 1 drop into both eyes daily as needed (Dry eye).   potassium chloride SA (K-DUR,KLOR-CON) 20 MEQ tablet Take 40 mEq by mouth 2 (two) times daily.    PRESCRIPTION MEDICATION Place 1 application vaginally 2 (two) times a week. Custom Compound  Estradiol 0.1mg /Ml Vb Cream - Vaginal PERL   rosuvastatin (CRESTOR) 5 MG tablet Take 5 mg by mouth every Monday, Wednesday, and Friday. In the morning.   SAVELLA 100 MG TABS tablet Take 100 mg by mouth 2 (two) times daily.    triamcinolone ointment (KENALOG) 0.1 % Apply 1 application  topically daily as needed for itching.   VENTOLIN HFA 108 (90 Base) MCG/ACT inhaler Inhale 2 puffs into the lungs every 6 (six) hours as needed.   Past Medical History:  Diagnosis Date   Allergic rhinitis    Anxiety and depression    Arthritis    Basal cell carcinoma 2002   Chronic lymphocytic thyroiditis    Chronic pain syndrome    Colon polyp    Depression    Diverticulosis    Dyspnea    with excertion   Erosive esophagitis    Family history of heart disease    Fibromyalgia    Gastric polyps    GERD (gastroesophageal reflux disease)    Hashimoto's disease    Hiatal hernia    History of nuclear stress test 04/2010   nonischemic    HLD (hyperlipidemia)    HTN (hypertension)    Hypothyroidism    Interstitial cystitis    Meniere's disease    Obesity    Pinched vertebral nerve    PONV (postoperative nausea and vomiting)    Raynaud's syndrome    Right hip pain 08/18/2012   Sleep apnea    Tuberculosis    postive skin test no symptoms  was around mom that had it   Vitamin B12 deficiency    Past Surgical History:  Procedure Laterality Date   ABDOMINAL HYSTERECTOMY  1982   BALLOON DILATION N/A 11/30/2019   Procedure: BALLOON DILATION;  Surgeon: Napoleon Form, MD;  Location: [facility];  Service: Endoscopy;  Laterality: N/A;   BASAL CELL CARCINOMA EXCISION  03-31-01   face   BLADDER REPAIR  2005   ovaries, tubes removed; pelvic prolapse corrected; rectal repair   BLADDER SURGERY  01-2009   with mesh placement   CATARACT EXTRACTION     bilateral   COLONOSCOPY W/ BIOPSIES AND POLYPECTOMY  12/20/2009   diverticulosis, adenomatous polyps   COLONOSCOPY WITH PROPOFOL N/A 08/26/2017   Procedure: COLONOSCOPY WITH PROPOFOL;  Surgeon: Iva Boop, MD;  Location: [facility];  Service: Endoscopy;  Laterality: N/A;   ENDOVENOUS ABLATION SAPHENOUS VEIN W/ LASER Left 08/07/2016   endovenous laser ablation left small  saphenous vein by Josephina Gip MD   ESOPHAGEAL MANOMETRY N/A 05/25/2013   Procedure: ESOPHAGEAL MANOMETRY (EM);  Surgeon: Iva Boop, MD;  [facility];  Service: Endoscopy;  Laterality: N/A;   ESOPHAGOGASTRODUODENOSCOPY (EGD) WITH ESOPHAGEAL DILATION  06/09/2012   Procedure: ESOPHAGOGASTRODUODENOSCOPY (EGD) WITH ESOPHAGEAL DILATION;  Surgeon: Iva Boop, MD;  [facility];  Service: Endoscopy;  Laterality: N/A;   ESOPHAGOGASTRODUODENOSCOPY (EGD) WITH PROPOFOL N/A 11/30/2019   Procedure: ESOPHAGOGASTRODUODENOSCOPY (EGD) WITH PROPOFOL;  Surgeon: Napoleon Form, MD;  [facility];  Service: Endoscopy;  Laterality: N/A;   FOOT SURGERY  1999   bilateral   HAMMER TOE SURGERY Right  11/13/2017   Procedure: HAMMER TOE REPAIR RIGHT 4TH TOE AND 5TH TOE;  Surgeon: Ferman Hamming, DPM;  Location: [facility];  Service: Podiatry;  Laterality: Right;   INTERSTIM IMPLANT PLACEMENT  01/2012   [facility]   laser surgery bladder  05-25-10   on mesh   LIPOMA EXCISION     right arm   NASOLACRIMAL DUCT PROBING  09-12-10   right eye   NASOLACRIMAL DUCT PROBING  09-26-10   left eye   OSTECTOMY Right 11/13/2017   Procedure: OSTECTOMY RIGHT 5TH TOE;  Surgeon: Ferman Hamming, DPM;  Location: [facility];  Service: Podiatry;  Laterality: Right;   removal of bladder mesh  03-01-11   [facility]   TONSILLECTOMY  1959   TRANSTHORACIC ECHOCARDIOGRAM  12/2006   EF=>55%; LA mild-mod dilated; RA mildly dilated; mild mitral annular calcif, mild MR; mod TR & elevated RVSP; mild pulm vavle regurg   UPPER GASTROINTESTINAL ENDOSCOPY  12/20/2009   GERD   Social History   Social History Narrative   Married   1 son and 2 daughters, 10 grandchildren   Remains active   family history includes Breast cancer in her maternal aunt; Clotting disorder in her grandchild and paternal grandmother; Colon cancer in her maternal grandfather and maternal uncle; Colon polyps in her father; Crohn's disease in her  grandchild; Diabetes in her brother, brother, father, maternal grandmother, and mother; Heart disease in her father and paternal grandfather; Hyperlipidemia in her father; Hypertension in her father and son; Pancreatic cancer in her mother; Prostate cancer in her father; Stroke in her brother and brother; Tuberculosis in her mother; Von Willebrand disease in her daughter and daughter.   Review of Systems As per HPI  Objective:   Physical Exam BP 124/80   Pulse 92   Ht 5' (1.524 m)   Wt 136 lb (61.7 kg)   BMI 26.56 kg/m  Abdomen is soft slightly protuberant and mildly tender to deep palpation in the left groin and left lower quadrant areas.  She cannot cooperate with a straight leg raise test but with abdominal wall tension with coughing there is no change or increase in the pain.  In fact it is less.  Negative Carnett's sign

## 2023-02-26 DIAGNOSIS — Z1331 Encounter for screening for depression: Secondary | ICD-10-CM | POA: Diagnosis not present

## 2023-02-26 DIAGNOSIS — F419 Anxiety disorder, unspecified: Secondary | ICD-10-CM | POA: Diagnosis not present

## 2023-02-26 DIAGNOSIS — G4730 Sleep apnea, unspecified: Secondary | ICD-10-CM | POA: Diagnosis not present

## 2023-02-26 DIAGNOSIS — Z6825 Body mass index (BMI) 25.0-25.9, adult: Secondary | ICD-10-CM | POA: Diagnosis not present

## 2023-02-26 DIAGNOSIS — E782 Mixed hyperlipidemia: Secondary | ICD-10-CM | POA: Diagnosis not present

## 2023-02-26 DIAGNOSIS — D511 Vitamin B12 deficiency anemia due to selective vitamin B12 malabsorption with proteinuria: Secondary | ICD-10-CM | POA: Diagnosis not present

## 2023-02-26 DIAGNOSIS — R7303 Prediabetes: Secondary | ICD-10-CM | POA: Diagnosis not present

## 2023-02-26 DIAGNOSIS — I10 Essential (primary) hypertension: Secondary | ICD-10-CM | POA: Diagnosis not present

## 2023-02-26 DIAGNOSIS — E039 Hypothyroidism, unspecified: Secondary | ICD-10-CM | POA: Diagnosis not present

## 2023-02-26 DIAGNOSIS — M797 Fibromyalgia: Secondary | ICD-10-CM | POA: Diagnosis not present

## 2023-02-26 DIAGNOSIS — Z0001 Encounter for general adult medical examination with abnormal findings: Secondary | ICD-10-CM | POA: Diagnosis not present

## 2023-02-26 DIAGNOSIS — Z23 Encounter for immunization: Secondary | ICD-10-CM | POA: Diagnosis not present

## 2023-03-06 DIAGNOSIS — H1045 Other chronic allergic conjunctivitis: Secondary | ICD-10-CM | POA: Diagnosis not present

## 2023-03-06 DIAGNOSIS — H02832 Dermatochalasis of right lower eyelid: Secondary | ICD-10-CM | POA: Diagnosis not present

## 2023-03-06 DIAGNOSIS — H43813 Vitreous degeneration, bilateral: Secondary | ICD-10-CM | POA: Diagnosis not present

## 2023-03-06 DIAGNOSIS — H02835 Dermatochalasis of left lower eyelid: Secondary | ICD-10-CM | POA: Diagnosis not present

## 2023-03-06 DIAGNOSIS — H02055 Trichiasis without entropian left lower eyelid: Secondary | ICD-10-CM | POA: Diagnosis not present

## 2023-03-06 DIAGNOSIS — H0102B Squamous blepharitis left eye, upper and lower eyelids: Secondary | ICD-10-CM | POA: Diagnosis not present

## 2023-03-06 DIAGNOSIS — H0102A Squamous blepharitis right eye, upper and lower eyelids: Secondary | ICD-10-CM | POA: Diagnosis not present

## 2023-03-06 DIAGNOSIS — H04123 Dry eye syndrome of bilateral lacrimal glands: Secondary | ICD-10-CM | POA: Diagnosis not present

## 2023-03-06 DIAGNOSIS — H02052 Trichiasis without entropian right lower eyelid: Secondary | ICD-10-CM | POA: Diagnosis not present

## 2023-03-18 DIAGNOSIS — H903 Sensorineural hearing loss, bilateral: Secondary | ICD-10-CM | POA: Diagnosis not present

## 2023-03-18 DIAGNOSIS — R519 Headache, unspecified: Secondary | ICD-10-CM | POA: Diagnosis not present

## 2023-03-26 DIAGNOSIS — L570 Actinic keratosis: Secondary | ICD-10-CM | POA: Diagnosis not present

## 2023-03-26 DIAGNOSIS — L578 Other skin changes due to chronic exposure to nonionizing radiation: Secondary | ICD-10-CM | POA: Diagnosis not present

## 2023-03-26 DIAGNOSIS — I872 Venous insufficiency (chronic) (peripheral): Secondary | ICD-10-CM | POA: Diagnosis not present

## 2023-04-03 DIAGNOSIS — J342 Deviated nasal septum: Secondary | ICD-10-CM | POA: Diagnosis not present

## 2023-04-03 DIAGNOSIS — J329 Chronic sinusitis, unspecified: Secondary | ICD-10-CM | POA: Diagnosis not present

## 2023-04-03 DIAGNOSIS — R519 Headache, unspecified: Secondary | ICD-10-CM | POA: Diagnosis not present

## 2023-04-09 DIAGNOSIS — M79671 Pain in right foot: Secondary | ICD-10-CM | POA: Diagnosis not present

## 2023-04-09 DIAGNOSIS — M7741 Metatarsalgia, right foot: Secondary | ICD-10-CM | POA: Diagnosis not present

## 2023-04-09 DIAGNOSIS — L84 Corns and callosities: Secondary | ICD-10-CM | POA: Diagnosis not present

## 2023-04-10 DIAGNOSIS — D511 Vitamin B12 deficiency anemia due to selective vitamin B12 malabsorption with proteinuria: Secondary | ICD-10-CM | POA: Diagnosis not present

## 2023-04-10 DIAGNOSIS — R7303 Prediabetes: Secondary | ICD-10-CM | POA: Diagnosis not present

## 2023-04-10 DIAGNOSIS — Z6825 Body mass index (BMI) 25.0-25.9, adult: Secondary | ICD-10-CM | POA: Diagnosis not present

## 2023-04-10 DIAGNOSIS — N10 Acute tubulo-interstitial nephritis: Secondary | ICD-10-CM | POA: Diagnosis not present

## 2023-04-10 DIAGNOSIS — E663 Overweight: Secondary | ICD-10-CM | POA: Diagnosis not present

## 2023-04-10 DIAGNOSIS — M5030 Other cervical disc degeneration, unspecified cervical region: Secondary | ICD-10-CM | POA: Diagnosis not present

## 2023-04-12 DIAGNOSIS — G4733 Obstructive sleep apnea (adult) (pediatric): Secondary | ICD-10-CM | POA: Diagnosis not present

## 2023-04-12 DIAGNOSIS — J449 Chronic obstructive pulmonary disease, unspecified: Secondary | ICD-10-CM | POA: Diagnosis not present

## 2023-04-22 DIAGNOSIS — N3020 Other chronic cystitis without hematuria: Secondary | ICD-10-CM | POA: Diagnosis not present

## 2023-04-22 DIAGNOSIS — N3946 Mixed incontinence: Secondary | ICD-10-CM | POA: Diagnosis not present

## 2023-04-23 DIAGNOSIS — N3020 Other chronic cystitis without hematuria: Secondary | ICD-10-CM | POA: Diagnosis not present

## 2023-05-02 DIAGNOSIS — D511 Vitamin B12 deficiency anemia due to selective vitamin B12 malabsorption with proteinuria: Secondary | ICD-10-CM | POA: Diagnosis not present

## 2023-05-12 DIAGNOSIS — J449 Chronic obstructive pulmonary disease, unspecified: Secondary | ICD-10-CM | POA: Diagnosis not present

## 2023-05-12 DIAGNOSIS — G4733 Obstructive sleep apnea (adult) (pediatric): Secondary | ICD-10-CM | POA: Diagnosis not present

## 2023-06-10 DIAGNOSIS — D511 Vitamin B12 deficiency anemia due to selective vitamin B12 malabsorption with proteinuria: Secondary | ICD-10-CM | POA: Diagnosis not present

## 2023-06-12 DIAGNOSIS — G4733 Obstructive sleep apnea (adult) (pediatric): Secondary | ICD-10-CM | POA: Diagnosis not present

## 2023-06-12 DIAGNOSIS — J449 Chronic obstructive pulmonary disease, unspecified: Secondary | ICD-10-CM | POA: Diagnosis not present

## 2023-06-22 ENCOUNTER — Other Ambulatory Visit: Payer: Self-pay | Admitting: Internal Medicine

## 2023-07-13 DIAGNOSIS — G4733 Obstructive sleep apnea (adult) (pediatric): Secondary | ICD-10-CM | POA: Diagnosis not present

## 2023-07-13 DIAGNOSIS — J449 Chronic obstructive pulmonary disease, unspecified: Secondary | ICD-10-CM | POA: Diagnosis not present

## 2023-07-26 DIAGNOSIS — D511 Vitamin B12 deficiency anemia due to selective vitamin B12 malabsorption with proteinuria: Secondary | ICD-10-CM | POA: Diagnosis not present

## 2023-08-06 DIAGNOSIS — Z1231 Encounter for screening mammogram for malignant neoplasm of breast: Secondary | ICD-10-CM | POA: Diagnosis not present

## 2023-08-07 DIAGNOSIS — M722 Plantar fascial fibromatosis: Secondary | ICD-10-CM | POA: Diagnosis not present

## 2023-08-07 DIAGNOSIS — M79672 Pain in left foot: Secondary | ICD-10-CM | POA: Diagnosis not present

## 2023-08-10 DIAGNOSIS — J449 Chronic obstructive pulmonary disease, unspecified: Secondary | ICD-10-CM | POA: Diagnosis not present

## 2023-08-10 DIAGNOSIS — G4733 Obstructive sleep apnea (adult) (pediatric): Secondary | ICD-10-CM | POA: Diagnosis not present

## 2023-09-02 DIAGNOSIS — D511 Vitamin B12 deficiency anemia due to selective vitamin B12 malabsorption with proteinuria: Secondary | ICD-10-CM | POA: Diagnosis not present

## 2023-09-10 DIAGNOSIS — G4733 Obstructive sleep apnea (adult) (pediatric): Secondary | ICD-10-CM | POA: Diagnosis not present

## 2023-09-10 DIAGNOSIS — J449 Chronic obstructive pulmonary disease, unspecified: Secondary | ICD-10-CM | POA: Diagnosis not present

## 2023-10-10 DIAGNOSIS — J449 Chronic obstructive pulmonary disease, unspecified: Secondary | ICD-10-CM | POA: Diagnosis not present

## 2023-10-10 DIAGNOSIS — G4733 Obstructive sleep apnea (adult) (pediatric): Secondary | ICD-10-CM | POA: Diagnosis not present

## 2023-10-22 DIAGNOSIS — N3946 Mixed incontinence: Secondary | ICD-10-CM | POA: Diagnosis not present

## 2023-10-22 DIAGNOSIS — N3020 Other chronic cystitis without hematuria: Secondary | ICD-10-CM | POA: Diagnosis not present

## 2023-10-24 DIAGNOSIS — D511 Vitamin B12 deficiency anemia due to selective vitamin B12 malabsorption with proteinuria: Secondary | ICD-10-CM | POA: Diagnosis not present

## 2023-11-10 DIAGNOSIS — J449 Chronic obstructive pulmonary disease, unspecified: Secondary | ICD-10-CM | POA: Diagnosis not present

## 2023-11-10 DIAGNOSIS — G4733 Obstructive sleep apnea (adult) (pediatric): Secondary | ICD-10-CM | POA: Diagnosis not present

## 2023-11-12 DIAGNOSIS — H02052 Trichiasis without entropian right lower eyelid: Secondary | ICD-10-CM | POA: Diagnosis not present

## 2023-11-12 DIAGNOSIS — H0102B Squamous blepharitis left eye, upper and lower eyelids: Secondary | ICD-10-CM | POA: Diagnosis not present

## 2023-11-12 DIAGNOSIS — H0102A Squamous blepharitis right eye, upper and lower eyelids: Secondary | ICD-10-CM | POA: Diagnosis not present

## 2023-11-12 DIAGNOSIS — Z961 Presence of intraocular lens: Secondary | ICD-10-CM | POA: Diagnosis not present

## 2023-11-19 DIAGNOSIS — S93492A Sprain of other ligament of left ankle, initial encounter: Secondary | ICD-10-CM | POA: Diagnosis not present

## 2023-11-19 DIAGNOSIS — B353 Tinea pedis: Secondary | ICD-10-CM | POA: Diagnosis not present

## 2023-11-19 DIAGNOSIS — L03115 Cellulitis of right lower limb: Secondary | ICD-10-CM | POA: Diagnosis not present

## 2023-12-03 DIAGNOSIS — L03115 Cellulitis of right lower limb: Secondary | ICD-10-CM | POA: Diagnosis not present

## 2023-12-03 DIAGNOSIS — D511 Vitamin B12 deficiency anemia due to selective vitamin B12 malabsorption with proteinuria: Secondary | ICD-10-CM | POA: Diagnosis not present

## 2023-12-03 DIAGNOSIS — B353 Tinea pedis: Secondary | ICD-10-CM | POA: Diagnosis not present

## 2023-12-10 DIAGNOSIS — J449 Chronic obstructive pulmonary disease, unspecified: Secondary | ICD-10-CM | POA: Diagnosis not present

## 2023-12-10 DIAGNOSIS — G4733 Obstructive sleep apnea (adult) (pediatric): Secondary | ICD-10-CM | POA: Diagnosis not present

## 2023-12-13 DIAGNOSIS — N3946 Mixed incontinence: Secondary | ICD-10-CM | POA: Diagnosis not present

## 2023-12-13 DIAGNOSIS — R350 Frequency of micturition: Secondary | ICD-10-CM | POA: Diagnosis not present

## 2023-12-13 DIAGNOSIS — N3020 Other chronic cystitis without hematuria: Secondary | ICD-10-CM | POA: Diagnosis not present

## 2024-01-10 DIAGNOSIS — J449 Chronic obstructive pulmonary disease, unspecified: Secondary | ICD-10-CM | POA: Diagnosis not present

## 2024-01-10 DIAGNOSIS — G4733 Obstructive sleep apnea (adult) (pediatric): Secondary | ICD-10-CM | POA: Diagnosis not present

## 2024-01-16 DIAGNOSIS — Z6826 Body mass index (BMI) 26.0-26.9, adult: Secondary | ICD-10-CM | POA: Diagnosis not present

## 2024-01-16 DIAGNOSIS — H8110 Benign paroxysmal vertigo, unspecified ear: Secondary | ICD-10-CM | POA: Diagnosis not present

## 2024-01-16 DIAGNOSIS — M5116 Intervertebral disc disorders with radiculopathy, lumbar region: Secondary | ICD-10-CM | POA: Diagnosis not present

## 2024-01-16 DIAGNOSIS — R7303 Prediabetes: Secondary | ICD-10-CM | POA: Diagnosis not present

## 2024-01-16 DIAGNOSIS — D511 Vitamin B12 deficiency anemia due to selective vitamin B12 malabsorption with proteinuria: Secondary | ICD-10-CM | POA: Diagnosis not present

## 2024-02-03 DIAGNOSIS — L821 Other seborrheic keratosis: Secondary | ICD-10-CM | POA: Diagnosis not present

## 2024-02-03 DIAGNOSIS — L538 Other specified erythematous conditions: Secondary | ICD-10-CM | POA: Diagnosis not present

## 2024-02-03 DIAGNOSIS — L570 Actinic keratosis: Secondary | ICD-10-CM | POA: Diagnosis not present

## 2024-02-03 DIAGNOSIS — L820 Inflamed seborrheic keratosis: Secondary | ICD-10-CM | POA: Diagnosis not present

## 2024-02-03 DIAGNOSIS — Z789 Other specified health status: Secondary | ICD-10-CM | POA: Diagnosis not present

## 2024-02-03 DIAGNOSIS — L2989 Other pruritus: Secondary | ICD-10-CM | POA: Diagnosis not present

## 2024-02-03 DIAGNOSIS — L814 Other melanin hyperpigmentation: Secondary | ICD-10-CM | POA: Diagnosis not present

## 2024-02-10 DIAGNOSIS — J449 Chronic obstructive pulmonary disease, unspecified: Secondary | ICD-10-CM | POA: Diagnosis not present

## 2024-02-10 DIAGNOSIS — G4733 Obstructive sleep apnea (adult) (pediatric): Secondary | ICD-10-CM | POA: Diagnosis not present

## 2024-02-14 DIAGNOSIS — M4125 Other idiopathic scoliosis, thoracolumbar region: Secondary | ICD-10-CM | POA: Diagnosis not present

## 2024-02-14 DIAGNOSIS — M4316 Spondylolisthesis, lumbar region: Secondary | ICD-10-CM | POA: Diagnosis not present

## 2024-02-14 DIAGNOSIS — Z6827 Body mass index (BMI) 27.0-27.9, adult: Secondary | ICD-10-CM | POA: Diagnosis not present

## 2024-02-18 ENCOUNTER — Other Ambulatory Visit (HOSPITAL_COMMUNITY): Payer: Self-pay | Admitting: Neurosurgery

## 2024-02-18 DIAGNOSIS — M4316 Spondylolisthesis, lumbar region: Secondary | ICD-10-CM

## 2024-02-21 ENCOUNTER — Other Ambulatory Visit: Payer: Self-pay | Admitting: Internal Medicine

## 2024-02-27 ENCOUNTER — Ambulatory Visit (HOSPITAL_COMMUNITY)
Admission: RE | Admit: 2024-02-27 | Discharge: 2024-02-27 | Disposition: A | Discharge status: Home / Self Care | Source: Ambulatory Visit | Attending: Neurosurgery | Admitting: Neurosurgery

## 2024-02-27 ENCOUNTER — Other Ambulatory Visit: Payer: Self-pay | Admitting: Physician Assistant

## 2024-02-27 DIAGNOSIS — M47817 Spondylosis without myelopathy or radiculopathy, lumbosacral region: Secondary | ICD-10-CM | POA: Diagnosis not present

## 2024-02-27 DIAGNOSIS — M4316 Spondylolisthesis, lumbar region: Secondary | ICD-10-CM | POA: Diagnosis not present

## 2024-02-27 DIAGNOSIS — M47816 Spondylosis without myelopathy or radiculopathy, lumbar region: Secondary | ICD-10-CM

## 2024-02-27 NOTE — Telephone Encounter (Signed)
 Error patient was requesting refill but needs to get establish first.

## 2024-03-04 ENCOUNTER — Ambulatory Visit (INDEPENDENT_AMBULATORY_CARE_PROVIDER_SITE_OTHER): Admitting: Physician Assistant

## 2024-03-04 ENCOUNTER — Encounter: Payer: Self-pay | Admitting: Physician Assistant

## 2024-03-04 VITALS — BP 164/92 | HR 95 | Temp 97.3°F | Ht 60.0 in | Wt 136.0 lb

## 2024-03-04 DIAGNOSIS — I10 Essential (primary) hypertension: Secondary | ICD-10-CM | POA: Diagnosis not present

## 2024-03-04 DIAGNOSIS — Z7689 Persons encountering health services in other specified circumstances: Secondary | ICD-10-CM

## 2024-03-04 DIAGNOSIS — M810 Age-related osteoporosis without current pathological fracture: Secondary | ICD-10-CM | POA: Diagnosis not present

## 2024-03-04 DIAGNOSIS — Z9189 Other specified personal risk factors, not elsewhere classified: Secondary | ICD-10-CM

## 2024-03-04 DIAGNOSIS — M797 Fibromyalgia: Secondary | ICD-10-CM

## 2024-03-04 DIAGNOSIS — Z23 Encounter for immunization: Secondary | ICD-10-CM

## 2024-03-04 DIAGNOSIS — M5416 Radiculopathy, lumbar region: Secondary | ICD-10-CM | POA: Diagnosis not present

## 2024-03-04 DIAGNOSIS — N3281 Overactive bladder: Secondary | ICD-10-CM | POA: Diagnosis not present

## 2024-03-04 DIAGNOSIS — E538 Deficiency of other specified B group vitamins: Secondary | ICD-10-CM

## 2024-03-04 DIAGNOSIS — N390 Urinary tract infection, site not specified: Secondary | ICD-10-CM | POA: Diagnosis not present

## 2024-03-04 MED ORDER — SAVELLA 100 MG PO TABS
100.0000 mg | ORAL_TABLET | Freq: Two times a day (BID) | ORAL | 1 refills | Status: AC
Start: 2024-03-04 — End: ?

## 2024-03-04 MED ORDER — GABAPENTIN 600 MG PO TABS: 600.0000 mg | ORAL_TABLET | Freq: Two times a day (BID) | ORAL | 1 refills | Status: AC

## 2024-03-04 MED ORDER — AMLODIPINE BESYLATE 5 MG PO TABS
5.0000 mg | ORAL_TABLET | Freq: Every day | ORAL | 1 refills | Status: AC
Start: 2024-03-04 — End: ?

## 2024-03-04 MED ORDER — FUROSEMIDE 40 MG PO TABS: 40.0000 mg | ORAL_TABLET | Freq: Every day | ORAL | 1 refills | Status: AC

## 2024-03-04 MED ORDER — GEMTESA 75 MG PO TABS: 75.0000 mg | ORAL_TABLET | Freq: Every day | ORAL | 1 refills | Status: AC

## 2024-03-04 MED ORDER — TRIMETHOPRIM 100 MG PO TABS: 100.0000 mg | ORAL_TABLET | Freq: Every day | ORAL | 1 refills | Status: AC

## 2024-03-04 NOTE — Progress Notes (Signed)
 New Patient Office Visit  Subjective    Patient ID: Audrey [name], female    DOB: 09-Dec-1940  Age: 83 y.o. MRN: [id number]  CC: No chief complaint on file.   HPI Audrey [name] presents to establish care  Discussed the use of AI scribe software for clinical note transcription with the patient, who gave verbal consent to proceed.  History of Present Illness Audrey [name] is an 83 year old female who presents today for transfer of care with increased pain and jitteriness after missing gabapentin doses.  She has been unable to obtain gabapentin since last Thursday, leading to increased pain and jitteriness. She takes 600 mg twice daily, which previously managed her symptoms well. She requires refills for gabapentin, Lasix, savella, and Gemtesa. She has run out of [place], which she takes at a dose of 75 mg, she follows every 6 months with [facility].  She has fibromyalgia and recurrent bladder infections, managed with trimethoprim 100 mg at bedtime. She underwent bladder sling surgery complicated by mesh growing into her bladder, requiring intervention.  She experiences pain in the right upper rib area after eating, associated with indigestion. This occurs frequently but not daily, and she manages it by eating light meals and avoiding late-night eating.  She has a family history of Von Willebrand's disease, which she tested positive for. Her daughters and grandchildren also have the condition. There is also a family history of heart disease.  She has high blood pressure, sleep apnea, acid reflux managed with Protonix , low thyroid  with a nodule, and osteoporosis. She has back issues with arthritis changes in the lower spine and scoliosis, experiencing increased pain with activities. She denies concerns or complaints aside from establishing care.     Outpatient Encounter Medications as of 03/04/2024  Medication Sig   amLODipine (NORVASC) 5 MG tablet Take 1 tablet (5 mg total) by mouth  daily.   Calcium Carb-Cholecalciferol (CALCIUM+D3 PO) Take 1 tablet by mouth daily. 1200 mg (Calcium)-25 mcg (D3)   chlordiazePOXIDE (LIBRIUM) 10 MG capsule Take 10 mg by mouth 2 (two) times daily.    Cholecalciferol (VITAMIN D3) 2000 UNITS TABS Take 2,000 Units by mouth at bedtime.    cyanocobalamin (,VITAMIN B-12,) 1000 MCG/ML injection Inject 1,000 mcg into the muscle every 30 (thirty) days.   fluticasone  (FLONASE ) 50 MCG/ACT nasal spray Place 2 sprays into both nostrils daily.   levothyroxine (SYNTHROID, LEVOTHROID) 50 MCG tablet Take 50 mcg by mouth daily before breakfast.    OXYGEN  Inhale 2 L into the lungs at bedtime. Just at night -Sleep apnea   pantoprazole  (PROTONIX ) 40 MG tablet TAKE (1) TABLET BY MOUTH TWICE DAILY BEFORE A MEAL.   Polyethyl Glycol-Propyl Glycol (SYSTANE) 0.4-0.3 % SOLN Place 1 drop into both eyes daily as needed (Dry eye).   potassium chloride SA (K-DUR,KLOR-CON) 20 MEQ tablet Take 40 mEq by mouth 2 (two) times daily.    rosuvastatin (CRESTOR) 5 MG tablet Take 5 mg by mouth every Monday, Wednesday, and Friday. In the morning.   [DISCONTINUED] furosemide (LASIX) 40 MG tablet Take 40 mg by mouth daily.    [DISCONTINUED] gabapentin (NEURONTIN) 600 MG tablet Take 600 mg by mouth 2 (two) times daily.    [DISCONTINUED] losartan-hydrochlorothiazide (HYZAAR) 100-25 MG per tablet Take 1 tablet by mouth daily.    [DISCONTINUED] SAVELLA 100 MG TABS tablet Take 100 mg by mouth 2 (two) times daily.   [DISCONTINUED] trimethoprim (TRIMPEX) 100 MG tablet Take 100 mg by mouth daily.   [  DISCONTINUED] Vibegron (GEMTESA) 75 MG TABS Take by mouth.   furosemide (LASIX) 40 MG tablet Take 1 tablet (40 mg total) by mouth daily.   gabapentin (NEURONTIN) 600 MG tablet Take 1 tablet (600 mg total) by mouth 2 (two) times daily.   PRESCRIPTION MEDICATION Place 1 application vaginally 2 (two) times a week. Custom Compound  Estradiol 0.1mg /Ml Vb Cream - Vaginal PERL   SAVELLA 100 MG TABS tablet  Take 1 tablet (100 mg total) by mouth 2 (two) times daily.   trimethoprim (TRIMPEX) 100 MG tablet Take 1 tablet (100 mg total) by mouth daily.   Vibegron (GEMTESA) 75 MG TABS Take 1 tablet (75 mg total) by mouth daily.   [DISCONTINUED] alendronate (FOSAMAX) 70 MG tablet Take 70 mg by mouth every Sunday. Take with a full glass of water on an empty stomach. (Patient not taking: Reported on 03/04/2024)   [DISCONTINUED] Coenzyme Q10 200 MG capsule Take 200 mg by mouth daily. (Patient not taking: Reported on 02/08/2023)   [DISCONTINUED] CRANBERRY PO Take 1 tablet by mouth daily. (Patient not taking: Reported on 02/08/2023)   [DISCONTINUED] Flaxseed, Linseed, (FLAX SEED OIL) 1300 MG CAPS Take 1,300 mg by mouth 2 (two) times daily. Omega 3 (Patient not taking: Reported on 02/08/2023)   [DISCONTINUED] Krill Oil 500 MG CAPS Take 500 mg by mouth daily. (Patient not taking: Reported on 02/08/2023)   [DISCONTINUED] triamcinolone  ointment (KENALOG ) 0.1 % Apply 1 application  topically daily as needed for itching. (Patient not taking: Reported on 03/04/2024)   [DISCONTINUED] VENTOLIN HFA 108 (90 Base) MCG/ACT inhaler Inhale 2 puffs into the lungs every 6 (six) hours as needed.   No facility-administered encounter medications on file as of 03/04/2024.    Past Medical History:  Diagnosis Date   Allergic rhinitis    Anxiety and depression    Arthritis    Basal cell carcinoma 2002   Chronic lymphocytic thyroiditis    Chronic pain syndrome    Colon polyp    Depression    Diverticulosis    Dyspnea    with excertion   Erosive esophagitis    Family history of heart disease    Fibromyalgia    Gastric polyps    GERD (gastroesophageal reflux disease)    Hashimoto's disease    Hiatal hernia    History of nuclear stress test 04/2010   nonischemic    HLD (hyperlipidemia)    HTN (hypertension)    Hypothyroidism    Interstitial cystitis    Meniere's disease    Obesity    Pinched vertebral nerve    PONV  (postoperative nausea and vomiting)    Raynaud's syndrome    Right hip pain 08/18/2012   Sleep apnea    Tuberculosis    postive skin test no symptoms  was around mom that had it   Vitamin B12 deficiency     Past Surgical History:  Procedure Laterality Date   ABDOMINAL HYSTERECTOMY  1982   BALLOON DILATION N/A 11/30/2019   Procedure: BALLOON DILATION;  Surgeon: Nandigam, Kavitha V, MD;  Location: [facility];  Service: Endoscopy;  Laterality: N/A;   BASAL CELL CARCINOMA EXCISION  03-31-01   face   BLADDER REPAIR  2005   ovaries, tubes removed; pelvic prolapse corrected; rectal repair   BLADDER SURGERY  01-2009   with mesh placement   CATARACT EXTRACTION     bilateral   COLONOSCOPY W/ BIOPSIES AND POLYPECTOMY  12/20/2009   diverticulosis, adenomatous polyps   COLONOSCOPY WITH PROPOFOL  N/A  08/26/2017   Procedure: COLONOSCOPY WITH PROPOFOL ;  Surgeon: Avram Lupita BRAVO, MD;  Location: [facility];  Service: Endoscopy;  Laterality: N/A;   ENDOVENOUS ABLATION SAPHENOUS VEIN W/ LASER Left 08/07/2016   endovenous laser ablation left small saphenous vein by Lynwood Collum MD   ESOPHAGEAL MANOMETRY N/A 05/25/2013   Procedure: ESOPHAGEAL MANOMETRY (EM);  Surgeon: Lupita BRAVO Avram, MD;  [facility]: [facility];  Service: Endoscopy;  Laterality: N/A;   ESOPHAGOGASTRODUODENOSCOPY (EGD) WITH ESOPHAGEAL DILATION  06/09/2012   Procedure: ESOPHAGOGASTRODUODENOSCOPY (EGD) WITH ESOPHAGEAL DILATION;  Surgeon: Lupita BRAVO Avram, MD;  [facility];  Service: Endoscopy;  Laterality: N/A;   ESOPHAGOGASTRODUODENOSCOPY (EGD) WITH PROPOFOL  N/A 11/30/2019   Procedure: ESOPHAGOGASTRODUODENOSCOPY (EGD) WITH PROPOFOL ;  Surgeon: Shila Gustav GAILS, MD;  [facility];  Service: Endoscopy;  Laterality: N/A;   FOOT SURGERY  1999   bilateral   HAMMER TOE SURGERY Right 11/13/2017   Procedure: HAMMER TOE REPAIR RIGHT 4TH TOE AND 5TH TOE;  Surgeon: Blinda Katz, DPM;  Location: [facility];  Service: Podiatry;   Laterality: Right;   INTERSTIM IMPLANT PLACEMENT  01/2012   [facility]   laser surgery bladder  05-25-10   on mesh   LIPOMA EXCISION     right arm   NASOLACRIMAL DUCT PROBING  09-12-10   right eye   NASOLACRIMAL DUCT PROBING  09-26-10   left eye   OSTECTOMY Right 11/13/2017   Procedure: OSTECTOMY RIGHT 5TH TOE;  Surgeon: Blinda Katz, DPM;  Location: [facility];  Service: Podiatry;  Laterality: Right;   removal of bladder mesh  03-01-11   [facility]   TONSILLECTOMY  1959   TRANSTHORACIC ECHOCARDIOGRAM  12/2006   EF=>55%; LA mild-mod dilated; RA mildly dilated; mild mitral annular calcif, mild MR; mod TR & elevated RVSP; mild pulm vavle regurg   UPPER GASTROINTESTINAL ENDOSCOPY  12/20/2009   GERD    Family History  Problem Relation Age of Onset   Pancreatic cancer Mother    Diabetes Mother    Tuberculosis Mother    Prostate cancer Father    Colon polyps Father    Diabetes Father    Hyperlipidemia Father    Hypertension Father        also kidney disease   Heart disease Father        CABG, twice   Diabetes Maternal Grandmother    Colon cancer Maternal Grandfather    Clotting disorder Paternal Grandmother        blood disorder   Heart disease Paternal Grandfather    Stroke Brother    Diabetes Brother    Diabetes Brother    Stroke Brother    Breast cancer Maternal Aunt        x 4   Colon cancer Maternal Uncle    Von Willebrand disease Daughter    Von Willebrand disease Daughter    Hypertension Son    Crohn's disease Grandchild    Clotting disorder Grandchild        x3    Social History   Socioeconomic History   Marital status: Married    Spouse name: Not on file   Number of children: 3   Years of education: Not on file   Highest education level: Some college, no degree  Occupational History   Occupation: retired - Surveyor, quantity  Tobacco Use   Smoking status: Never   Smokeless tobacco: Never  Vaping Use   Vaping status: Never Used  Substance and Sexual Activity    Alcohol use: No  Drug use: No   Sexual activity: Not Currently  Other Topics Concern   Not on file  Social History Narrative   Married   1 son and 2 daughters, 10 grandchildren   Remains active   Social Drivers of Health   Financial Resource Strain: Low Risk  (02/29/2024)   Overall Financial Resource Strain (CARDIA)    Difficulty of Paying Living Expenses: Not very hard  Food Insecurity: No Food Insecurity (02/29/2024)   Hunger Vital Sign    Worried About Running Out of Food in the Last Year: Never true    Ran Out of Food in the Last Year: Never true  Transportation Needs: No Transportation Needs (02/29/2024)   PRAPARE - Administrator, Civil Service (Medical): No    Lack of Transportation (Non-Medical): No  Physical Activity: Inactive (02/29/2024)   Exercise Vital Sign    Days of Exercise per Week: 0 days    Minutes of Exercise per Session: Not on file  Stress: Stress Concern Present (02/29/2024)   [employer] of Occupational Health - Occupational Stress Questionnaire    Feeling of Stress: To some extent  Social Connections: Moderately Integrated (02/29/2024)   Social Connection and Isolation Panel    Frequency of Communication with Friends and Family: More than three times a week    Frequency of Social Gatherings with Friends and Family: More than three times a week    Attends Religious Services: More than 4 times per year    Active Member of [employer] or Organizations: Yes    Attends Banker Meetings: More than 4 times per year    Marital Status: Widowed  Intimate Partner Violence: Not on file    Review of Systems  Constitutional:  Negative for chills, fever and malaise/fatigue.  Eyes:  Negative for blurred vision and double vision.  Respiratory:  Negative for cough and shortness of breath.   Cardiovascular:  Negative for chest pain and palpitations.  Musculoskeletal:  Positive for back pain and joint pain. Negative for falls and myalgias.   Neurological:  Negative for dizziness and headaches.        Objective    BP (!) 164/92   Pulse 95   Temp (!) 97.3 F (36.3 C)   Ht 5' (1.524 m)   Wt 136 lb (61.7 kg)   BMI 26.56 kg/m   Physical Exam Constitutional:      General: She is not in acute distress.    Appearance: Normal appearance. She is normal weight. She is not ill-appearing.  HENT:     Head: Normocephalic and atraumatic.     Mouth/Throat:     Mouth: Mucous membranes are moist.     Pharynx: Oropharynx is clear.  Eyes:     Extraocular Movements: Extraocular movements intact.     Conjunctiva/sclera: Conjunctivae normal.  Cardiovascular:     Rate and Rhythm: Normal rate and regular rhythm.     Heart sounds: Normal heart sounds. No murmur heard. Pulmonary:     Effort: Pulmonary effort is normal.     Breath sounds: Normal breath sounds.  Musculoskeletal:     Right lower leg: No edema.     Left lower leg: No edema.  Skin:    General: Skin is warm and dry.  Neurological:     General: No focal deficit present.     Mental Status: She is alert and oriented to person, place, and time.  Psychiatric:        Mood and Affect:  Mood normal.        Behavior: Behavior normal.       Assessment & Plan:  Encounter to establish care  Essential hypertension Assessment & Plan: 164/92 Uncontrolled. Continue current medications. Increasing amlodipine to 5 mg daily. Monitor BP at home Discussed DASH diet and dietary sodium restrictions.  Continue  dietary efforts and physical activity. Follow up in 1 month or sooner for new headache, visual changes, chest pain, or chest pain.  Placed ordered for coronary calcium screening today based on patient preference.    Orders: -     amLODIPine Besylate; Take 1 tablet (5 mg total) by mouth daily.  Dispense: 90 tablet; Refill: 1  Age-related osteoporosis without current pathological fracture Assessment & Plan: Currently on medication holiday from alendronate. Last bone  density screening incomplete due to equipment issues. - Order bone density screening at [facility] in [place]. - Decide on osteoporosis medication based on screening results.   Orders: -     DG Bone Density  B12 deficiency Assessment & Plan: Previously managed with monthly B12 injections. Insurance requires updated B12 level. - Order B12 level today. - Schedule nurse visit for B12 injection after obtaining B12 level.  Orders: -     Vitamin B12  Fibromyalgia Assessment & Plan: Increased pain and jitteriness after missing gabapentin and savella doses. Previously managed well  - Refilled gabapentin and Savella at [facility].   Orders: -     Savella; Take 1 tablet (100 mg total) by mouth 2 (two) times daily.  Dispense: 180 tablet; Refill: 1  Overactive bladder -     Gemtesa; Take 1 tablet (75 mg total) by mouth daily.  Dispense: 90 tablet; Refill: 1  Recurrent UTI -     Trimethoprim; Take 1 tablet (100 mg total) by mouth daily.  Dispense: 90 tablet; Refill: 1  Lumbar radiculopathy -     Gabapentin; Take 1 tablet (600 mg total) by mouth 2 (two) times daily.  Dispense: 180 tablet; Refill: 1  At high risk for cardiovascular disease -     CT CARDIAC SCORING (SELF PAY ONLY)  Immunization due -     Flu vaccine HIGH DOSE PF(Fluzone Trivalent)  Other orders -     Furosemide; Take 1 tablet (40 mg total) by mouth daily.  Dispense: 90 tablet; Refill: 1   Return in about 4 weeks (around 04/01/2024) for BP.   Charmaine Vaniya Augspurger, PA-C

## 2024-03-04 NOTE — Assessment & Plan Note (Signed)
 164/92 Uncontrolled. Continue current medications. Increasing amlodipine to 5 mg daily. Monitor BP at home Discussed DASH diet and dietary sodium restrictions.  Continue  dietary efforts and physical activity. Follow up in 1 month or sooner for new headache, visual changes, chest pain, or chest pain.  Placed ordered for coronary calcium screening today based on patient preference.

## 2024-03-04 NOTE — Assessment & Plan Note (Signed)
 Previously managed with monthly B12 injections. Insurance requires updated B12 level. - Order B12 level today. - Schedule nurse visit for B12 injection after obtaining B12 level.

## 2024-03-04 NOTE — Assessment & Plan Note (Signed)
 Increased pain and jitteriness after missing gabapentin and savella doses. Previously managed well  - Refilled gabapentin and Savella at [facility].

## 2024-03-04 NOTE — Assessment & Plan Note (Signed)
 Currently on medication holiday from alendronate. Last bone density screening incomplete due to equipment issues. - Order bone density screening at [facility] in [place]. - Decide on osteoporosis medication based on screening results.

## 2024-03-05 ENCOUNTER — Ambulatory Visit: Payer: Self-pay | Admitting: Physician Assistant

## 2024-03-05 LAB — VITAMIN B12: Vitamin B-12: 194 pg/mL — ABNORMAL LOW (ref 232–1245)

## 2024-03-11 DIAGNOSIS — G4733 Obstructive sleep apnea (adult) (pediatric): Secondary | ICD-10-CM | POA: Diagnosis not present

## 2024-03-20 ENCOUNTER — Ambulatory Visit: Admitting: Physician Assistant

## 2024-03-23 ENCOUNTER — Ambulatory Visit (HOSPITAL_COMMUNITY)
Admission: RE | Admit: 2024-03-23 | Discharge: 2024-03-23 | Disposition: A | Discharge status: Home / Self Care | Payer: Self-pay | Source: Ambulatory Visit | Attending: Physician Assistant | Admitting: Physician Assistant

## 2024-03-23 DIAGNOSIS — Z9189 Other specified personal risk factors, not elsewhere classified: Secondary | ICD-10-CM

## 2024-03-31 ENCOUNTER — Encounter: Payer: Self-pay | Admitting: Physician Assistant

## 2024-03-31 ENCOUNTER — Ambulatory Visit: Admitting: Physician Assistant

## 2024-03-31 VITALS — BP 149/83 | HR 84 | Temp 97.7°F | Ht 60.0 in | Wt 139.0 lb

## 2024-03-31 DIAGNOSIS — I10 Essential (primary) hypertension: Secondary | ICD-10-CM

## 2024-03-31 DIAGNOSIS — E538 Deficiency of other specified B group vitamins: Secondary | ICD-10-CM | POA: Diagnosis not present

## 2024-03-31 DIAGNOSIS — N189 Chronic kidney disease, unspecified: Secondary | ICD-10-CM | POA: Insufficient documentation

## 2024-03-31 MED ORDER — CYANOCOBALAMIN 1000 MCG/ML IJ SOLN
1000.0000 ug | Freq: Once | INTRAMUSCULAR | Status: AC
  Administered 2024-03-31: 1000 ug via INTRAMUSCULAR

## 2024-03-31 NOTE — Assessment & Plan Note (Signed)
 Patient presents today with fatigue and history of B12 deficiency. Last B12 level was 194. Patient given injection today.

## 2024-03-31 NOTE — Assessment & Plan Note (Signed)
 149/83, home readings ranging from 125-135/80. No chest pain, shortness of breath, headache, or visual changes.  Stable. Continue current medications. No change in management. Discussed DASH diet and dietary sodium restrictions.  Continue dietary efforts and physical activity. Follow up in 2 months for re-evaluation. Sooner for new symptoms.

## 2024-03-31 NOTE — Progress Notes (Signed)
 Established Patient Office Visit  Subjective   Patient ID: Audrey [name], female    DOB: 10-23-1940  Age: 83 y.o. MRN: [id number]  Chief Complaint  Patient presents with   Follow-up    Patient is here for a 1 month follow up on blood pressure.  She mentioned having blood work completed and wanted to go over results. She stated that she has fingers and upper metacarpal pain at times.     Discussed the use of AI scribe software for clinical note transcription with the patient, who gave verbal consent to proceed.  History of Present Illness Audrey [name] is an 83 year old female with hypertension who presents for follow-up on blood pressure management.  She is on amlodipine 5 mg with home blood pressure readings between 125/80 and 135/80, but today's office reading is 151/75. She questions the accuracy of her five-year-old home blood pressure machine. No chest pain, shortness of breath, headache, or visual changes.  She experiences general fatigue with occasional insomnia, feeling worse in the mornings. Her B12 levels were previously low and she is due for a B12 injection today.   Review of Systems  Constitutional:  Positive for malaise/fatigue. Negative for chills and fever.  Eyes:  Negative for blurred vision and double vision.  Respiratory:  Negative for cough and shortness of breath.   Cardiovascular:  Negative for chest pain and palpitations.  Musculoskeletal:  Positive for joint pain. Negative for myalgias.  Neurological:  Negative for dizziness and headaches.  Psychiatric/Behavioral:  The patient has insomnia.       Objective:     BP (!) 149/83   Pulse 84   Temp 97.7 F (36.5 C)   Ht 5' (1.524 m)   Wt 139 lb (63 kg)   SpO2 96%   BMI 27.15 kg/m    Physical Exam Constitutional:      General: She is not in acute distress.    Appearance: Normal appearance. She is normal weight. She is not ill-appearing.  HENT:     Head: Normocephalic and atraumatic.      Mouth/Throat:     Mouth: Mucous membranes are moist.     Pharynx: Oropharynx is clear.  Eyes:     Extraocular Movements: Extraocular movements intact.     Conjunctiva/sclera: Conjunctivae normal.  Cardiovascular:     Rate and Rhythm: Normal rate and regular rhythm.     Heart sounds: Normal heart sounds. No murmur heard. Pulmonary:     Effort: Pulmonary effort is normal.     Breath sounds: Normal breath sounds. No wheezing, rhonchi or rales.  Skin:    General: Skin is warm and dry.  Neurological:     General: No focal deficit present.     Mental Status: She is alert and oriented to person, place, and time.  Psychiatric:        Mood and Affect: Mood normal.        Behavior: Behavior normal.     No results found for any visits on 03/31/24.  The ASCVD Risk score (Arnett DK, et al., 2019) failed to calculate for the following reasons:   The 2019 ASCVD risk score is only valid for ages 62 to 60    Assessment & Plan:   Return in about 2 months (around 05/31/2024) for BP.   Essential hypertension Assessment & Plan: 149/83, home readings ranging from 125-135/80. No chest pain, shortness of breath, headache, or visual changes.  Stable. Continue current medications. No change in  management. Discussed DASH diet and dietary sodium restrictions.  Continue dietary efforts and physical activity. Follow up in 2 months for re-evaluation. Sooner for new symptoms.     B12 deficiency Assessment & Plan: Patient presents today with fatigue and history of B12 deficiency. Last B12 level was 194. Patient given injection today.   Orders: -     Cyanocobalamin    Tyshun Tuckerman, PA-C

## 2024-04-13 DIAGNOSIS — Z6826 Body mass index (BMI) 26.0-26.9, adult: Secondary | ICD-10-CM | POA: Diagnosis not present

## 2024-04-13 DIAGNOSIS — M4125 Other idiopathic scoliosis, thoracolumbar region: Secondary | ICD-10-CM | POA: Diagnosis not present

## 2024-04-14 ENCOUNTER — Other Ambulatory Visit: Payer: Self-pay

## 2024-04-14 ENCOUNTER — Ambulatory Visit

## 2024-04-14 MED ORDER — ROSUVASTATIN CALCIUM 5 MG PO TABS: 5.0000 mg | ORAL_TABLET | ORAL | 2 refills | Status: AC

## 2024-04-23 DIAGNOSIS — N3020 Other chronic cystitis without hematuria: Secondary | ICD-10-CM | POA: Diagnosis not present

## 2024-05-12 ENCOUNTER — Ambulatory Visit

## 2024-05-12 NOTE — Progress Notes (Signed)
 Pharmacy Quality Measure Review  This patient is appearing on a report for being at risk of failing the adherence measure for cholesterol (statin) medications this calendar year.   Medication: Rosuvastatin  5 mg Last fill date: 04/14/24 for 35 day supply, sold 04/21/24  Insurance report was not up to date. No action needed at this time.   Jenkins Graces, PharmD PGY1 Pharmacy Resident

## 2024-05-15 ENCOUNTER — Ambulatory Visit

## 2024-05-15 ENCOUNTER — Other Ambulatory Visit: Payer: Self-pay | Admitting: Physician Assistant

## 2024-05-15 DIAGNOSIS — E538 Deficiency of other specified B group vitamins: Secondary | ICD-10-CM

## 2024-05-15 DIAGNOSIS — M810 Age-related osteoporosis without current pathological fracture: Secondary | ICD-10-CM

## 2024-05-15 LAB — DG BONE DENSITY

## 2024-05-15 MED ORDER — CYANOCOBALAMIN 1000 MCG/ML IJ SOLN: 1000.0000 ug | Freq: Once | INTRAMUSCULAR | Status: AC

## 2024-05-26 ENCOUNTER — Ambulatory Visit (HOSPITAL_COMMUNITY): Attending: Neurosurgery

## 2024-05-26 ENCOUNTER — Encounter (HOSPITAL_COMMUNITY): Payer: Self-pay

## 2024-05-26 DIAGNOSIS — M5459 Other low back pain: Secondary | ICD-10-CM | POA: Diagnosis present

## 2024-05-26 DIAGNOSIS — M6281 Muscle weakness (generalized): Secondary | ICD-10-CM | POA: Insufficient documentation

## 2024-05-26 NOTE — Therapy (Signed)
 " OUTPATIENT PHYSICAL THERAPY THORACOLUMBAR EVALUATION   Patient Name: Audrey [name] MRN: [id number] DOB:22-Sep-1940, 84 y.o., female Today's Date: 05/26/2024  END OF SESSION:  PT End of Session - 05/26/24 1030     Visit Number 1    Number of Visits 12    Date for Recertification  08/14/24    Authorization Type [employer]    Authorization Time Period no authorization required    PT Start Time 1030    PT Stop Time 1117    PT Time Calculation (min) 47 min    Activity Tolerance Patient tolerated treatment well    Behavior During Therapy WFL for tasks assessed/performed          Past Medical History:  Diagnosis Date   Allergic rhinitis    Anxiety and depression    Arthritis    Basal cell carcinoma 2002   Chronic lymphocytic thyroiditis    Chronic pain syndrome    Colon polyp    Depression    Diverticulosis    Dyspnea    with excertion   Erosive esophagitis    Family history of heart disease    Fibromyalgia    Gastric polyps    GERD (gastroesophageal reflux disease)    Hashimoto's disease    Hiatal hernia    History of nuclear stress test 04/2010   nonischemic    HLD (hyperlipidemia)    HTN (hypertension)    Hypothyroidism    Interstitial cystitis    Meniere's disease    Obesity    Pinched vertebral nerve    PONV (postoperative nausea and vomiting)    Raynaud's syndrome    Right hip pain 08/18/2012   Sleep apnea    Tuberculosis    postive skin test no symptoms  was around mom that had it   Vitamin B12 deficiency    Past Surgical History:  Procedure Laterality Date   ABDOMINAL HYSTERECTOMY  1982   BALLOON DILATION N/A 11/30/2019   Procedure: BALLOON DILATION;  Surgeon: Nandigam, Kavitha V, MD;  Location: [facility];  Service: Endoscopy;  Laterality: N/A;   BASAL CELL CARCINOMA EXCISION  03-31-01   face   BLADDER REPAIR  2005   ovaries, tubes removed; pelvic prolapse corrected; rectal repair   BLADDER SURGERY  01-2009   with mesh placement    CATARACT EXTRACTION     bilateral   COLONOSCOPY W/ BIOPSIES AND POLYPECTOMY  12/20/2009   diverticulosis, adenomatous polyps   COLONOSCOPY WITH PROPOFOL  N/A 08/26/2017   Procedure: COLONOSCOPY WITH PROPOFOL ;  Surgeon: Avram Lupita BRAVO, MD;  Location: [facility];  Service: Endoscopy;  Laterality: N/A;   ENDOVENOUS ABLATION SAPHENOUS VEIN W/ LASER Left 08/07/2016   endovenous laser ablation left small saphenous vein by Lynwood Collum MD   ESOPHAGEAL MANOMETRY N/A 05/25/2013   Procedure: ESOPHAGEAL MANOMETRY (EM);  Surgeon: Lupita BRAVO Avram, MD;  Location: [facility];  Service: Endoscopy;  Laterality: N/A;   ESOPHAGOGASTRODUODENOSCOPY (EGD) WITH ESOPHAGEAL DILATION  06/09/2012   Procedure: ESOPHAGOGASTRODUODENOSCOPY (EGD) WITH ESOPHAGEAL DILATION;  Surgeon: Lupita BRAVO Avram, MD;  Location: [facility];  Service: Endoscopy;  Laterality: N/A;   ESOPHAGOGASTRODUODENOSCOPY (EGD) WITH PROPOFOL  N/A 11/30/2019   Procedure: ESOPHAGOGASTRODUODENOSCOPY (EGD) WITH PROPOFOL ;  Surgeon: Shila Gustav GAILS, MD;  Location: [facility];  Service: Endoscopy;  Laterality: N/A;   FOOT SURGERY  1999   bilateral   HAMMER TOE SURGERY Right 11/13/2017   Procedure: HAMMER TOE REPAIR RIGHT 4TH TOE AND 5TH TOE;  Surgeon: Blinda Katz, DPM;  Location: [facility];  Service: Podiatry;  Laterality: Right;   INTERSTIM IMPLANT PLACEMENT  01/2012   [facility]   laser surgery bladder  05-25-10   on mesh   LIPOMA EXCISION     right arm   NASOLACRIMAL DUCT PROBING  09-12-10   right eye   NASOLACRIMAL DUCT PROBING  09-26-10   left eye   OSTECTOMY Right 11/13/2017   Procedure: OSTECTOMY RIGHT 5TH TOE;  Surgeon: Blinda Katz, DPM;  Location: [facility];  Service: Podiatry;  Laterality: Right;   removal of bladder mesh  03-01-11   [facility]   TONSILLECTOMY  1959   TRANSTHORACIC ECHOCARDIOGRAM  12/2006   EF=>55%; LA mild-mod dilated; RA mildly dilated; mild mitral annular calcif, mild MR; mod TR & elevated RVSP; mild pulm vavle regurg   UPPER  GASTROINTESTINAL ENDOSCOPY  12/20/2009   GERD   Patient Active Problem List   Diagnosis Date Noted   CKD (chronic kidney disease) 03/31/2024   Osteoporosis 03/04/2024   B12 deficiency 03/04/2024   Lumbar radiculopathy 03/04/2024   Hiatal hernia    OSA (obstructive sleep apnea) 12/10/2018   Varicose veins of left lower extremity with complications 06/26/2016   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   Presbycusis of both ears 03/06/2016   Acquired hypothyroidism 09/24/2014   Recurrent UTI 09/22/2014   Erosion of implanted vaginal mesh to surrounding organ or tissue 05/05/2013   Essential hypertension 03/25/2013   Hyperlipidemia 03/25/2013   Lumbago 10/16/2012   Low back pain 08/18/2012   Right hip pain 08/18/2012   Overactive bladder 03/21/2012   S/P balloon dilatation of esophageal stricture 01/29/2012   Raynaud's disease 01/29/2012   Meniere disease 01/29/2012   Fibromyalgia 01/29/2012   Urge incontinence 12/27/2011   Atrophic vaginitis 12/27/2011   Family history of von Willebrand disease 06/26/2011   GERD 11/29/2009   Esophageal dysphagia 11/29/2009    PCP: Grooms, [place], PA-C   REFERRING PROVIDER: [name], MD   REFERRING DIAG: Other idiopathic scoliosis, thoracolumbar regio   Rationale for Evaluation and Treatment: Rehabilitation  THERAPY DIAG:  Other low back pain  Muscle weakness (generalized)  ONSET DATE: several years ago  SUBJECTIVE:                                                                                                                                                                                           SUBJECTIVE STATEMENT: Patient reports that she has been having back pain for several years now, but it has been getting worse. She was diagnosed with scoliosis and she feels like she can feel her ribs on her right side. She can feel a pinch in both hips  depending how she moves. She has fallen, but it was over six months ago  (June 2025). She feels like it will pull her to the right side while walking, but it gets worse throughout the day.   PERTINENT HISTORY:  HTN, Raynaud's disease, Meniere disease, osteoporosis, CKD, fibromyalgia, OA, and Hashimoto's disease  PAIN:  Are you having pain? Yes: NPRS scale: Current: 4/10 Best: 3/10 Worst: 9/10  Pain location: low back and both hips  Pain description: pinch and catch  Aggravating factors: prolonged standing, twisting, stepping into tub  Relieving factors: stopping and resting  PRECAUTIONS: None  RED FLAGS: None   WEIGHT BEARING RESTRICTIONS: No  FALLS:  Has patient fallen in last 6 months? No  LIVING ENVIRONMENT: Lives with: lives alone Lives in: House/apartment Stairs: Yes: Internal: 10-12 steps; on right going up and on left going up and External: 3 steps; on right going up Has following equipment at home: Grab bars  OCCUPATION: retired  PLOF: Independent  PATIENT GOALS: reduced pain and be able to walk more (100 feet currently prior to pain)  NEXT MD VISIT: none scheduled  OBJECTIVE:  Note: Objective measures were completed at Evaluation unless otherwise noted.  DIAGNOSTIC FINDINGS: 02/27/24 lumbar MRI  IMPRESSION: 51 degree levoscoliosis with multilevel degenerative changes as outlined above.   There is no significant spinal stenosis.   There is severe facet arthropathy at L4-5 and L5-S1.   No acute abnormality.  No significant change from Oct 13, 2019  PATIENT SURVEYS:  Modified Oswestry:  MODIFIED OSWESTRY DISABILITY SCALE  Date: 05/26/24 Score  Pain intensity 3 =  Pain medication provides me with moderate relief from pain.  2. Personal care (washing, dressing, etc.) 2 =  It is painful to take care of myself, and I am slow and careful.  3. Lifting 3 = Pain prevents me from lifting heavy weights, but I can manage light to medium weights if they are conveniently positioned  4. Walking 3 =  Pain prevents me from walking more than   mile.  5. Sitting 2 =  Pain prevents me from sitting more than 1 hour.  6. Standing 3 =  Pain prevents me from standing more than 1/2 hour.  7. Sleeping 4 =  Even when I take pain medication, I sleep less than 2 hour  8. Social Life 1 =  My social life is normal, but it increases my level of pain.  9. Traveling 0 =  I can travel anywhere without increased pain.  10. Employment/ Homemaking 1 = My normal homemaking/job activities increase my pain, but I can still perform all that is required of me  Total 22/50   Interpretation of scores: Score Category Description  0-20% Minimal Disability The patient can cope with most living activities. Usually no treatment is indicated apart from advice on lifting, sitting and exercise  21-40% Moderate Disability The patient experiences more pain and difficulty with sitting, lifting and standing. Travel and social life are more difficult and they may be disabled from work. Personal care, sexual activity and sleeping are not grossly affected, and the patient can usually be managed by conservative means  41-60% Severe Disability Pain remains the main problem in this group, but activities of daily living are affected. These patients require a detailed investigation  61-80% Crippled Back pain impinges on all aspects of the patients life. Positive intervention is required  81-100% Bed-bound These patients are either bed-bound or exaggerating their symptoms  Bluford BRAVO, Zoe DELENA Karon DELENA,  et al. Surgery versus conservative management of stable thoracolumbar fracture: the PRESTO feasibility RCT. [place] ([place]): [employer]; 2021 Nov. [facility] Technology Assessment, No. 25.62.) Appendix 3, Oswestry Disability Index category descriptors. Available from: Findjewelers.cz  Minimally Clinically Important Difference (MCID) = 12.8%  COGNITION: Overall cognitive status: Within functional limits for tasks  assessed     SENSATION: Light touch: Impaired  and diminished sensation throughout left lower extremity compared to the right Patient reports intermittent numbness in her right hand and toes  PALPATION: TTP: bilateral lumbar paraspinals (reproduced familiar radiating pain), L1-5 spinous process (reproduced familiar radiating pain), left QL, gluteals, and piriformis  LUMBAR ROM:   AROM eval  Flexion WFL   Extension 100% limited; familiar pain   Right lateral flexion 25% limited; painful pulling  Left lateral flexion 25% limited; unable to isolate side bending; painful pulling  Right rotation WFL   Left rotation 25% limited   (Blank rows = not tested)  LOWER EXTREMITY ROM:  WFL for activities assessed  LOWER EXTREMITY MMT:    MMT Right eval Left eval  Hip flexion 4-/5 3+/5  Hip extension    Hip abduction    Hip adduction    Hip internal rotation    Hip external rotation    Knee flexion 5/5 4/5  Knee extension 5/5 4+/5  Ankle dorsiflexion 4/5 4/5  Ankle plantarflexion    Ankle inversion    Ankle eversion     (Blank rows = not tested)  FUNCTIONAL TESTS:  5 times sit to stand: To be assessed at first follow-up, as able 2 minute walk test: To be assessed at first follow-up, as able Sit to stand: requires UE support   GAIT: Distance walked: 50 feet Assistive device utilized: None Level of assistance: Complete Independence   TREATMENT DATE:                                                                                                                               05/26/24: PT evaluation, patient education, and HEP    PATIENT EDUCATION:  Education details: Plan of care, prognosis, anatomy, healing, objective findings HEP,, and goals for physical therapy Person educated: Patient Education method: Explanation, Demonstration, and Handouts Education comprehension: verbalized understanding and returned demonstration  HOME EXERCISE PROGRAM: Access Code: [email or web address] URL:  [email or web address] Date: 05/26/2024 Prepared by: Lacinda Fass  Exercises - Supine Active Straight Leg Raise  - 1 x daily - 7 x weekly - 2 sets - 10 reps - Hooklying Gluteal Sets  - 1 x daily - 7 x weekly - 2 sets - 10 reps - 5 seconds  hold  ASSESSMENT:  CLINICAL IMPRESSION: Patient is a 84 y.o. 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  She presented with moderate pain severity and irritability with lumbar extension and sidebending reproducing her familiar symptoms.  She also exhibited reduced left lower extremity muscular  strength compared to the right lower extremity.  She was provided a home exercise program which she was able to properly demonstrate.  She reported understanding.  Recommend that she continue with skilled physical therapy to address her impairments to maximize her functional mobility.  OBJECTIVE IMPAIRMENTS: decreased activity tolerance, decreased mobility, difficulty walking, decreased ROM, decreased strength, hypomobility, impaired tone, and pain.   ACTIVITY LIMITATIONS: lifting, bending, sitting, standing, sleeping, transfers, and locomotion level  PARTICIPATION LIMITATIONS: cleaning, laundry, shopping, and community activity  PERSONAL FACTORS: Age, Past/current experiences, Time since onset of injury/illness/exacerbation, and 3+ comorbidities: HTN, Raynaud's disease, Meniere disease, osteoporosis, CKD, fibromyalgia, OA, and Hashimoto's disease are also affecting patient's functional outcome.   REHAB POTENTIAL: Fair    CLINICAL DECISION MAKING: Evolving/moderate complexity  EVALUATION COMPLEXITY: Moderate   GOALS: Goals reviewed with patient? Yes  SHORT TERM GOALS: Target date: 06/16/24  Patient will be independent with her initial HEP. Baseline: Goal status: INITIAL  2.  Patient will improve her left quadriceps to 5/5 for improved independence with  transfers. Baseline:  Goal status: INITIAL  3.  Patient will report being able to walk at least 150 feet without being limited by her familiar symptoms. Baseline:  Goal status: INITIAL  LONG TERM GOALS: Target date: 07/07/24  Patient will be independent with her advanced HEP. Baseline:  Goal status: INITIAL  2.  Patient will be able to complete her daily activities without her familiar pain exceeding 6/10. Baseline:  Goal status: INITIAL  3.  Patient will improve her ODI score to 15/50 or less for improved perceived function with her daily activities. Baseline:  Goal status: INITIAL  4.  Patient will be able to transfer from sitting to standing without upper extremity support for improved functional mobility. Baseline:  Goal status: INITIAL  5.  Patient will report being able to stand for at least 40 minutes for improved function shopping. Baseline:  Goal status: INITIAL  PLAN:  PT FREQUENCY: 2x/week  PT DURATION: 6 weeks  PLANNED INTERVENTIONS: 97164- PT Re-evaluation, 97750- Physical Performance Testing, 97110-Therapeutic exercises, 97530- Therapeutic activity, [id number]- Neuromuscular re-education, 97535- Self Care, [id number]- Manual therapy, [phone]- Gait training, Patient/Family education, Balance training, Stair training, Joint mobilization, Spinal mobilization, Cryotherapy, and Moist heat.  PLAN FOR NEXT SESSION: 2-minute walk test, 5 times sit to stand, lumbar and lower extremity strengthening, and manual therapy as needed   Lacinda JAYSON Fass, PT 05/26/2024, 6:04 PM  "

## 2024-05-28 ENCOUNTER — Ambulatory Visit (HOSPITAL_COMMUNITY)

## 2024-05-28 ENCOUNTER — Encounter (HOSPITAL_COMMUNITY): Payer: Self-pay

## 2024-05-28 DIAGNOSIS — M5459 Other low back pain: Secondary | ICD-10-CM

## 2024-05-28 DIAGNOSIS — M6281 Muscle weakness (generalized): Secondary | ICD-10-CM

## 2024-05-28 NOTE — Therapy (Signed)
 " OUTPATIENT PHYSICAL THERAPY THORACOLUMBAR TREATMENT   Patient Name: Audrey [name] MRN: [id number] DOB:1941/04/07, 84 y.o., female Today's Date: 05/28/2024  END OF SESSION:  PT End of Session - 05/28/24 1330     Visit Number 2    Number of Visits 12    Date for Recertification  08/14/24    Authorization Type [employer]    Authorization Time Period no authorization required    PT Start Time 1330    PT Stop Time 1413    PT Time Calculation (min) 43 min    Activity Tolerance Patient tolerated treatment well    Behavior During Therapy [facility] for tasks assessed/performed           Past Medical History:  Diagnosis Date   Allergic rhinitis    Anxiety and depression    Arthritis    Basal cell carcinoma 2002   Chronic lymphocytic thyroiditis    Chronic pain syndrome    Colon polyp    Depression    Diverticulosis    Dyspnea    with excertion   Erosive esophagitis    Family history of heart disease    Fibromyalgia    Gastric polyps    GERD (gastroesophageal reflux disease)    Hashimoto's disease    Hiatal hernia    History of nuclear stress test 04/2010   nonischemic    HLD (hyperlipidemia)    HTN (hypertension)    Hypothyroidism    Interstitial cystitis    Meniere's disease    Obesity    Pinched vertebral nerve    PONV (postoperative nausea and vomiting)    Raynaud's syndrome    Right hip pain 08/18/2012   Sleep apnea    Tuberculosis    postive skin test no symptoms  was around mom that had it   Vitamin B12 deficiency    Past Surgical History:  Procedure Laterality Date   ABDOMINAL HYSTERECTOMY  1982   BALLOON DILATION N/A 11/30/2019   Procedure: BALLOON DILATION;  Surgeon: Nandigam, Kavitha V, MD;  Location: [facility];  Service: Endoscopy;  Laterality: N/A;   BASAL CELL CARCINOMA EXCISION  03-31-01   face   BLADDER REPAIR  2005   ovaries, tubes removed; pelvic prolapse corrected; rectal repair   BLADDER SURGERY  01-2009   with mesh placement    CATARACT EXTRACTION     bilateral   COLONOSCOPY W/ BIOPSIES AND POLYPECTOMY  12/20/2009   diverticulosis, adenomatous polyps   COLONOSCOPY WITH PROPOFOL  N/A 08/26/2017   Procedure: COLONOSCOPY WITH PROPOFOL ;  Surgeon: Avram Lupita BRAVO, MD;  Location: [facility];  Service: Endoscopy;  Laterality: N/A;   ENDOVENOUS ABLATION SAPHENOUS VEIN W/ LASER Left 08/07/2016   endovenous laser ablation left small saphenous vein by Lynwood Collum MD   ESOPHAGEAL MANOMETRY N/A 05/25/2013   Procedure: ESOPHAGEAL MANOMETRY (EM);  Surgeon: Lupita BRAVO Avram, MD;  Location: [facility];  Service: Endoscopy;  Laterality: N/A;   ESOPHAGOGASTRODUODENOSCOPY (EGD) WITH ESOPHAGEAL DILATION  06/09/2012   Procedure: ESOPHAGOGASTRODUODENOSCOPY (EGD) WITH ESOPHAGEAL DILATION;  Surgeon: Lupita BRAVO Avram, MD;  Location: [facility];  Service: Endoscopy;  Laterality: N/A;   ESOPHAGOGASTRODUODENOSCOPY (EGD) WITH PROPOFOL  N/A 11/30/2019   Procedure: ESOPHAGOGASTRODUODENOSCOPY (EGD) WITH PROPOFOL ;  Surgeon: Shila Gustav GAILS, MD;  Location: [facility];  Service: Endoscopy;  Laterality: N/A;   FOOT SURGERY  1999   bilateral   HAMMER TOE SURGERY Right 11/13/2017   Procedure: HAMMER TOE REPAIR RIGHT 4TH TOE AND 5TH TOE;  Surgeon: Blinda Katz, DPM;  Location:  [facility];  Service: Podiatry;  Laterality: Right;   INTERSTIM IMPLANT PLACEMENT  01/2012   [facility]   laser surgery bladder  05-25-10   on mesh   LIPOMA EXCISION     right arm   NASOLACRIMAL DUCT PROBING  09-12-10   right eye   NASOLACRIMAL DUCT PROBING  09-26-10   left eye   OSTECTOMY Right 11/13/2017   Procedure: OSTECTOMY RIGHT 5TH TOE;  Surgeon: Blinda Katz, DPM;  Location: [facility];  Service: Podiatry;  Laterality: Right;   removal of bladder mesh  03-01-11   [facility]   TONSILLECTOMY  1959   TRANSTHORACIC ECHOCARDIOGRAM  12/2006   EF=>55%; LA mild-mod dilated; RA mildly dilated; mild mitral annular calcif, mild MR; mod TR & elevated RVSP; mild pulm vavle regurg   UPPER  GASTROINTESTINAL ENDOSCOPY  12/20/2009   GERD   Patient Active Problem List   Diagnosis Date Noted   CKD (chronic kidney disease) 03/31/2024   Osteoporosis 03/04/2024   B12 deficiency 03/04/2024   Lumbar radiculopathy 03/04/2024   Hiatal hernia    OSA (obstructive sleep apnea) 12/10/2018   Varicose veins of left lower extremity with complications 06/26/2016   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   Presbycusis of both ears 03/06/2016   Acquired hypothyroidism 09/24/2014   Recurrent UTI 09/22/2014   Erosion of implanted vaginal mesh to surrounding organ or tissue 05/05/2013   Essential hypertension 03/25/2013   Hyperlipidemia 03/25/2013   Lumbago 10/16/2012   Low back pain 08/18/2012   Right hip pain 08/18/2012   Overactive bladder 03/21/2012   S/P balloon dilatation of esophageal stricture 01/29/2012   Raynaud's disease 01/29/2012   Meniere disease 01/29/2012   Fibromyalgia 01/29/2012   Urge incontinence 12/27/2011   Atrophic vaginitis 12/27/2011   Family history of von Willebrand disease 06/26/2011   GERD 11/29/2009   Esophageal dysphagia 11/29/2009    PCP: Grooms, Lutz, PA-C   REFERRING PROVIDER: [name], MD   REFERRING DIAG: Other idiopathic scoliosis, thoracolumbar regio   Rationale for Evaluation and Treatment: Rehabilitation  THERAPY DIAG:  Other low back pain  Muscle weakness (generalized)  ONSET DATE: several years ago  SUBJECTIVE:                                                                                                                                                                                           SUBJECTIVE STATEMENT: Patient reports that she feels about the same today. She still has the catch in her back and it is sore.   Eval: Patient reports that she has been having back pain for several years now, but it has been getting worse. She  was diagnosed with scoliosis and she feels like she can feel her ribs on her right  side. She can feel a pinch in both hips depending how she moves. She has fallen, but it was over six months ago (June 2025). She feels like it will pull her to the right side while walking, but it gets worse throughout the day.   PERTINENT HISTORY:  HTN, Raynaud's disease, Meniere disease, osteoporosis, CKD, fibromyalgia, OA, and Hashimoto's disease  PAIN:  Are you having pain? Yes: NPRS scale: Current: 2-3/10 Best: 3/10 Worst: 9/10  Pain location: low back and both hips  Pain description: pinch and catch  Aggravating factors: prolonged standing, twisting, stepping into tub  Relieving factors: stopping and resting  PRECAUTIONS: None  RED FLAGS: None   WEIGHT BEARING RESTRICTIONS: No  FALLS:  Has patient fallen in last 6 months? No  LIVING ENVIRONMENT: Lives with: lives alone Lives in: House/apartment Stairs: Yes: Internal: 10-12 steps; on right going up and on left going up and External: 3 steps; on right going up Has following equipment at home: Grab bars  OCCUPATION: retired  PLOF: Independent  PATIENT GOALS: reduced pain and be able to walk more (100 feet currently prior to pain)  NEXT MD VISIT: none scheduled  OBJECTIVE:  Note: Objective measures were completed at Evaluation unless otherwise noted.  DIAGNOSTIC FINDINGS: 02/27/24 lumbar MRI  IMPRESSION: 51 degree levoscoliosis with multilevel degenerative changes as outlined above.   There is no significant spinal stenosis.   There is severe facet arthropathy at L4-5 and L5-S1.   No acute abnormality.  No significant change from Oct 13, 2019  PATIENT SURVEYS:  Modified Oswestry:  MODIFIED OSWESTRY DISABILITY SCALE  Date: 05/26/24 Score  Pain intensity 3 =  Pain medication provides me with moderate relief from pain.  2. Personal care (washing, dressing, etc.) 2 =  It is painful to take care of myself, and I am slow and careful.  3. Lifting 3 = Pain prevents me from lifting heavy weights, but I can manage light  to medium weights if they are conveniently positioned  4. Walking 3 =  Pain prevents me from walking more than  mile.  5. Sitting 2 =  Pain prevents me from sitting more than 1 hour.  6. Standing 3 =  Pain prevents me from standing more than 1/2 hour.  7. Sleeping 4 =  Even when I take pain medication, I sleep less than 2 hour  8. Social Life 1 =  My social life is normal, but it increases my level of pain.  9. Traveling 0 =  I can travel anywhere without increased pain.  10. Employment/ Homemaking 1 = My normal homemaking/job activities increase my pain, but I can still perform all that is required of me  Total 22/50   Interpretation of scores: Score Category Description  0-20% Minimal Disability The patient can cope with most living activities. Usually no treatment is indicated apart from advice on lifting, sitting and exercise  21-40% Moderate Disability The patient experiences more pain and difficulty with sitting, lifting and standing. Travel and social life are more difficult and they may be disabled from work. Personal care, sexual activity and sleeping are not grossly affected, and the patient can usually be managed by conservative means  41-60% Severe Disability Pain remains the main problem in this group, but activities of daily living are affected. These patients require a detailed investigation  61-80% Crippled Back pain impinges on all aspects of the  patients life. Positive intervention is required  81-100% Bed-bound These patients are either bed-bound or exaggerating their symptoms  Bluford FORBES Zoe DELENA Karon DELENA, et al. Surgery versus conservative management of stable thoracolumbar fracture: the PRESTO feasibility RCT. [place] ([place]): [employer]; 2021 Nov. [facility] Technology Assessment, No. 25.62.) Appendix 3, Oswestry Disability Index category descriptors. Available from: Findjewelers.cz  Minimally Clinically Important Difference (MCID)  = 12.8%  COGNITION: Overall cognitive status: Within functional limits for tasks assessed     SENSATION: Light touch: Impaired  and diminished sensation throughout left lower extremity compared to the right Patient reports intermittent numbness in her right hand and toes  PALPATION: TTP: bilateral lumbar paraspinals (reproduced familiar radiating pain), L1-5 spinous process (reproduced familiar radiating pain), left QL, gluteals, and piriformis  LUMBAR ROM:   AROM eval  Flexion WFL   Extension 100% limited; familiar pain   Right lateral flexion 25% limited; painful pulling  Left lateral flexion 25% limited; unable to isolate side bending; painful pulling  Right rotation WFL   Left rotation 25% limited   (Blank rows = not tested)  LOWER EXTREMITY ROM:  WFL for activities assessed  LOWER EXTREMITY MMT:    MMT Right eval Left eval  Hip flexion 4-/5 3+/5  Hip extension    Hip abduction    Hip adduction    Hip internal rotation    Hip external rotation    Knee flexion 5/5 4/5  Knee extension 5/5 4+/5  Ankle dorsiflexion 4/5 4/5  Ankle plantarflexion    Ankle inversion    Ankle eversion     (Blank rows = not tested)  FUNCTIONAL TESTS:  5 times sit to stand: 23.84 seconds on 05/28/24 2 minute walk test: 334 feet on 05/28/24 Sit to stand: requires UE support   GAIT: Distance walked: 50 feet Assistive device utilized: None Level of assistance: Complete Independence   TREATMENT DATE:                                                                                                                                                                05/28/24 EXERCISE LOG  Exercise Repetitions and Resistance Comments  Glute sets  2 minutes w/ 5 second hold    Double knee to chest  2 minutes  With LE supported on green ball   Supine march  2 minutes  Alternating LE   5x STS  23.84 seconds    2 WMW  334 feet   Isometric ball press  2 minutes w/ 5 second hold    Standing hip ABD 3  minutes  Alternating LE   Nustep  L4 x 5 minutes     Blank cell = exercise not performed today   05/26/24: PT evaluation, patient education, and HEP  PATIENT EDUCATION:  Education details: HEP, healing, activity modification, and objective findings Person educated: Patient Education method: Explanation, Demonstration, and Handouts Education comprehension: verbalized understanding and returned demonstration  HOME EXERCISE PROGRAM: Access Code: [email or web address] URL: [email or web address] Date: 05/28/2024 Prepared by: Lacinda Fass  Exercises - Seated Abdominal Press into [employer]  - 1 x daily - 7 x weekly - 3 sets - 10 reps - 5 seconds hold - Supine Hip and Knee Flexion AROM with Swiss Ball  - 1 x daily - 7 x weekly - 3 sets - 10 reps  Access Code: [email or web address] URL: [email or web address] Date: 05/26/2024 Prepared by: Lacinda Fass  Exercises - Supine Active Straight Leg Raise  - 1 x daily - 7 x weekly - 2 sets - 10 reps - Hooklying Gluteal Sets  - 1 x daily - 7 x weekly - 2 sets - 10 reps - 5 seconds  hold  ASSESSMENT:  CLINICAL IMPRESSION: Patient was introduced to multiple new interventions for improved lumbar stability.. She required minimal cueing with today's new interventions for proper exercise performance. She experienced no significant increase in pain or discomfort with any of today's interventions. She reported feeling ok upon the conclusion of treatment. Patient continues to require skilled physical therapy to address her remaining impairments to return to her prior level of function.    Eval: Patient is a 84 y.o. 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  She presented with moderate pain severity and irritability with lumbar extension and sidebending reproducing her familiar symptoms.  She also exhibited reduced left lower extremity muscular strength compared to  the right lower extremity.  She was provided a home exercise program which she was able to properly demonstrate.  She reported understanding.  Recommend that she continue with skilled physical therapy to address her impairments to maximize her functional mobility.  OBJECTIVE IMPAIRMENTS: decreased activity tolerance, decreased mobility, difficulty walking, decreased ROM, decreased strength, hypomobility, impaired tone, and pain.   ACTIVITY LIMITATIONS: lifting, bending, sitting, standing, sleeping, transfers, and locomotion level  PARTICIPATION LIMITATIONS: cleaning, laundry, shopping, and community activity  PERSONAL FACTORS: Age, Past/current experiences, Time since onset of injury/illness/exacerbation, and 3+ comorbidities: HTN, Raynaud's disease, Meniere disease, osteoporosis, CKD, fibromyalgia, OA, and Hashimoto's disease are also affecting patient's functional outcome.   REHAB POTENTIAL: Fair    CLINICAL DECISION MAKING: Evolving/moderate complexity  EVALUATION COMPLEXITY: Moderate   GOALS: Goals reviewed with patient? Yes  SHORT TERM GOALS: Target date: 06/16/24  Patient will be independent with her initial HEP. Baseline: Goal status: INITIAL  2.  Patient will improve her left quadriceps to 5/5 for improved independence with transfers. Baseline:  Goal status: INITIAL  3.  Patient will report being able to walk at least 150 feet without being limited by her familiar symptoms. Baseline:  Goal status: INITIAL  LONG TERM GOALS: Target date: 07/07/24  Patient will be independent with her advanced HEP. Baseline:  Goal status: INITIAL  2.  Patient will be able to complete her daily activities without her familiar pain exceeding 6/10. Baseline:  Goal status: INITIAL  3.  Patient will improve her ODI score to 15/50 or less for improved perceived function with her daily activities. Baseline:  Goal status: INITIAL  4.  Patient will be able to transfer from sitting to  standing without upper extremity support for improved functional mobility. Baseline:  Goal status: INITIAL  5.  Patient will report being able to stand for  at least 40 minutes for improved function shopping. Baseline:  Goal status: INITIAL  PLAN:  PT FREQUENCY: 2x/week  PT DURATION: 6 weeks  PLANNED INTERVENTIONS: 97164- PT Re-evaluation, 97750- Physical Performance Testing, 97110-Therapeutic exercises, 97530- Therapeutic activity, [id number]- Neuromuscular re-education, 97535- Self Care, [id number]- Manual therapy, [phone]- Gait training, Patient/Family education, Balance training, Stair training, Joint mobilization, Spinal mobilization, Cryotherapy, and Moist heat.  PLAN FOR NEXT SESSION: 2-minute walk test, 5 times sit to stand, lumbar and lower extremity strengthening, and manual therapy as needed   Lacinda JAYSON Fass, PT 05/28/2024, 5:55 PM   "

## 2024-05-28 NOTE — Progress Notes (Signed)
 Audrey [name]                                          MRN: [id number]   05/28/2024   The [facility] Quality Team Specialist reviewed this patient medical record for the purposes of chart review for care gap closure. The following were reviewed: chart review for care gap closure-controlling blood pressure.    [facility] Quality Team

## 2024-06-02 ENCOUNTER — Encounter: Payer: Self-pay | Admitting: Physician Assistant

## 2024-06-02 ENCOUNTER — Ambulatory Visit (INDEPENDENT_AMBULATORY_CARE_PROVIDER_SITE_OTHER): Admitting: Physician Assistant

## 2024-06-02 VITALS — BP 142/84 | HR 68 | Temp 97.9°F | Ht 60.0 in | Wt 139.4 lb

## 2024-06-02 DIAGNOSIS — I10 Essential (primary) hypertension: Secondary | ICD-10-CM | POA: Diagnosis not present

## 2024-06-02 DIAGNOSIS — N6002 Solitary cyst of left breast: Secondary | ICD-10-CM

## 2024-06-02 NOTE — Progress Notes (Signed)
 "   Acute Office Visit  Subjective:     Patient ID: Audrey [name], female    DOB: 09/05/1940, 84 y.o.   MRN: [id number]   Discussed the use of AI scribe software for clinical note transcription with the patient, who gave verbal consent to proceed.  History of Present Illness Audrey [name] is an 84 year old female who presents with a lesion on her left nipple.  Approximately six weeks ago, she noticed a lesion on her left nipple. It began as a blackhead-like spot, has enlarged, and is now white beige. She has no pain, redness, warmth, fever, or body aches and no prior similar lesions. She has a lifelong history of inverted nipples. She recalls a prior callback for breast ultrasound but no recent abnormal mammograms. Her last mammogram was in March and the next is scheduled for this spring.  She has labile blood pressure, measuring 125/82 mmHg this morning and up to 140/90 mmHg around Christmas. She monitors at home and takes amlodipine  5 mg daily. Denies symptoms at this time.     Review of Systems  Constitutional:  Negative for activity change, appetite change, chills, fatigue, fever and unexpected weight change.  Respiratory:  Negative for cough, chest tightness and shortness of breath.   Cardiovascular:  Negative for chest pain.  Skin:  Positive for rash. Negative for color change and wound.       Nipple lesion        Objective:     BP (!) 142/84 (BP Location: Left Arm, Cuff Size: Large)   Pulse 68   Temp 97.9 F (36.6 C)   Ht 5' (1.524 m)   Wt 139 lb 6.4 oz (63.2 kg)   SpO2 93%   BMI 27.22 kg/m   Physical Exam Constitutional:      General: She is not in acute distress.    Appearance: Normal appearance. She is normal weight. She is not ill-appearing.  HENT:     Head: Normocephalic and atraumatic.     Mouth/Throat:     Mouth: Mucous membranes are moist.     Pharynx: Oropharynx is clear.  Eyes:     Extraocular Movements: Extraocular movements intact.      Conjunctiva/sclera: Conjunctivae normal.  Cardiovascular:     Rate and Rhythm: Normal rate and regular rhythm.     Heart sounds: Normal heart sounds. No murmur heard. Pulmonary:     Effort: Pulmonary effort is normal.  Chest:  Breasts:    Left: Inverted nipple (normal for her) present. No swelling, bleeding, nipple discharge, skin change or tenderness.    Skin:    General: Skin is warm and dry.  Neurological:     General: No focal deficit present.     Mental Status: She is alert and oriented to person, place, and time.  Psychiatric:        Mood and Affect: Mood normal.        Behavior: Behavior normal.     No results found for any visits on 06/02/24.      Assessment & Plan:  Sandie gland cyst of left breast Assessment & Plan: Patient presents today with concern for left nipple lesion. Presentation consistent with montgomery cyst. No erythema, tenderness, or discharge on exam. No systemic symptoms.  Expected to resolve spontaneously. Last mammogram was normal last March, upcoming mammogram scheduled in March. - Monitor for changes such as redness, pain, or signs of infection. Return for evaluation if symptoms worsen or if there are  signs of infection.   Essential hypertension Assessment & Plan: Blood pressure stable with Amlodipine  5 mg. Recent readings around 125/82 at home Elevated in office. No current symptoms.  - Continue Amlodipine  5 mg. - Monitor blood pressure at home regularly. - Return for evaluation if readings exceed 140/90 or for new symptoms such as chest pain, shortness of breath, headache, or visual changes.      Return in about 3 months (around 08/31/2024) for BP.  Tomoya Ringwald, PA-C  "

## 2024-06-02 NOTE — Assessment & Plan Note (Signed)
 Patient presents today with concern for left nipple lesion. Presentation consistent with montgomery cyst. No erythema, tenderness, or discharge on exam. No systemic symptoms.  Expected to resolve spontaneously. Last mammogram was normal last March, upcoming mammogram scheduled in March. - Monitor for changes such as redness, pain, or signs of infection. Return for evaluation if symptoms worsen or if there are signs of infection.

## 2024-06-02 NOTE — Assessment & Plan Note (Signed)
 Blood pressure stable with Amlodipine  5 mg. Recent readings around 125/82 at home Elevated in office. No current symptoms.  - Continue Amlodipine  5 mg. - Monitor blood pressure at home regularly. - Return for evaluation if readings exceed 140/90 or for new symptoms such as chest pain, shortness of breath, headache, or visual changes.

## 2024-06-10 ENCOUNTER — Ambulatory Visit (HOSPITAL_COMMUNITY)

## 2024-06-10 DIAGNOSIS — M5459 Other low back pain: Secondary | ICD-10-CM | POA: Diagnosis not present

## 2024-06-10 DIAGNOSIS — M6281 Muscle weakness (generalized): Secondary | ICD-10-CM

## 2024-06-10 NOTE — Therapy (Signed)
 " OUTPATIENT PHYSICAL THERAPY THORACOLUMBAR TREATMENT   Patient Name: Audrey [name] MRN: [id number] DOB:23-Jan-1941, 84 y.o., female Today's Date: 06/10/2024  END OF SESSION:  PT End of Session - 06/10/24 1117     Visit Number 3    Number of Visits 12    Date for Recertification  08/14/24    Authorization Type [employer]    Authorization Time Period no authorization required    PT Start Time 1115    PT Stop Time 1153    PT Time Calculation (min) 38 min    Activity Tolerance Patient tolerated treatment well    Behavior During Therapy [facility] for tasks assessed/performed           Past Medical History:  Diagnosis Date   Allergic rhinitis    Anxiety and depression    Arthritis    Basal cell carcinoma 2002   Chronic lymphocytic thyroiditis    Chronic pain syndrome    Colon polyp    Depression    Diverticulosis    Dyspnea    with excertion   Erosive esophagitis    Family history of heart disease    Fibromyalgia    Gastric polyps    GERD (gastroesophageal reflux disease)    Hashimoto's disease    Hiatal hernia    History of nuclear stress test 04/2010   nonischemic    HLD (hyperlipidemia)    HTN (hypertension)    Hypothyroidism    Interstitial cystitis    Meniere's disease    Obesity    Pinched vertebral nerve    PONV (postoperative nausea and vomiting)    Raynaud's syndrome    Right hip pain 08/18/2012   Sleep apnea    Tuberculosis    postive skin test no symptoms  was around mom that had it   Vitamin B12 deficiency    Past Surgical History:  Procedure Laterality Date   ABDOMINAL HYSTERECTOMY  1982   BALLOON DILATION N/A 11/30/2019   Procedure: BALLOON DILATION;  Surgeon: Nandigam, Kavitha V, MD;  Location: [facility];  Service: Endoscopy;  Laterality: N/A;   BASAL CELL CARCINOMA EXCISION  03-31-01   face   BLADDER REPAIR  2005   ovaries, tubes removed; pelvic prolapse corrected; rectal repair   BLADDER SURGERY  01-2009   with mesh placement    CATARACT EXTRACTION     bilateral   COLONOSCOPY W/ BIOPSIES AND POLYPECTOMY  12/20/2009   diverticulosis, adenomatous polyps   COLONOSCOPY WITH PROPOFOL  N/A 08/26/2017   Procedure: COLONOSCOPY WITH PROPOFOL ;  Surgeon: Avram Lupita BRAVO, MD;  Location: [facility];  Service: Endoscopy;  Laterality: N/A;   ENDOVENOUS ABLATION SAPHENOUS VEIN W/ LASER Left 08/07/2016   endovenous laser ablation left small saphenous vein by Lynwood Collum MD   ESOPHAGEAL MANOMETRY N/A 05/25/2013   Procedure: ESOPHAGEAL MANOMETRY (EM);  Surgeon: Lupita BRAVO Avram, MD;  Location: [facility];  Service: Endoscopy;  Laterality: N/A;   ESOPHAGOGASTRODUODENOSCOPY (EGD) WITH ESOPHAGEAL DILATION  06/09/2012   Procedure: ESOPHAGOGASTRODUODENOSCOPY (EGD) WITH ESOPHAGEAL DILATION;  Surgeon: Lupita BRAVO Avram, MD;  Location: [facility];  Service: Endoscopy;  Laterality: N/A;   ESOPHAGOGASTRODUODENOSCOPY (EGD) WITH PROPOFOL  N/A 11/30/2019   Procedure: ESOPHAGOGASTRODUODENOSCOPY (EGD) WITH PROPOFOL ;  Surgeon: Shila Gustav GAILS, MD;  Location: [facility];  Service: Endoscopy;  Laterality: N/A;   FOOT SURGERY  1999   bilateral   HAMMER TOE SURGERY Right 11/13/2017   Procedure: HAMMER TOE REPAIR RIGHT 4TH TOE AND 5TH TOE;  Surgeon: Blinda Katz, DPM;  Location:  [facility];  Service: Podiatry;  Laterality: Right;   INTERSTIM IMPLANT PLACEMENT  01/2012   [facility]   laser surgery bladder  05-25-10   on mesh   LIPOMA EXCISION     right arm   NASOLACRIMAL DUCT PROBING  09-12-10   right eye   NASOLACRIMAL DUCT PROBING  09-26-10   left eye   OSTECTOMY Right 11/13/2017   Procedure: OSTECTOMY RIGHT 5TH TOE;  Surgeon: Blinda Katz, DPM;  Location: [facility];  Service: Podiatry;  Laterality: Right;   removal of bladder mesh  03-01-11   [facility]   TONSILLECTOMY  1959   TRANSTHORACIC ECHOCARDIOGRAM  12/2006   EF=>55%; LA mild-mod dilated; RA mildly dilated; mild mitral annular calcif, mild MR; mod TR & elevated RVSP; mild pulm vavle regurg   UPPER  GASTROINTESTINAL ENDOSCOPY  12/20/2009   GERD   Patient Active Problem List   Diagnosis Date Noted   Sandie gland cyst of left breast 06/02/2024   CKD (chronic kidney disease) 03/31/2024   Osteoporosis 03/04/2024   B12 deficiency 03/04/2024   Lumbar radiculopathy 03/04/2024   Hiatal hernia    OSA (obstructive sleep apnea) 12/10/2018   Varicose veins of left lower extremity with complications 06/26/2016   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   Presbycusis of both ears 03/06/2016   Acquired hypothyroidism 09/24/2014   Recurrent UTI 09/22/2014   Erosion of implanted vaginal mesh to surrounding organ or tissue 05/05/2013   Essential hypertension 03/25/2013   Hyperlipidemia 03/25/2013   Lumbago 10/16/2012   Low back pain 08/18/2012   Right hip pain 08/18/2012   Overactive bladder 03/21/2012   S/P balloon dilatation of esophageal stricture 01/29/2012   Raynaud's disease 01/29/2012   Meniere disease 01/29/2012   Fibromyalgia 01/29/2012   Urge incontinence 12/27/2011   Atrophic vaginitis 12/27/2011   Family history of von Willebrand disease 06/26/2011   GERD 11/29/2009   Esophageal dysphagia 11/29/2009    PCP: Grooms, [place], PA-C   REFERRING PROVIDER: [name], MD   REFERRING DIAG: Other idiopathic scoliosis, thoracolumbar regio   Rationale for Evaluation and Treatment: Rehabilitation  THERAPY DIAG:  Other low back pain  Muscle weakness (generalized)  ONSET DATE: several years ago  SUBJECTIVE:                                                                                                                                                                                           SUBJECTIVE STATEMENT: Patient still having back pain; catches sometimes; scoliosis runs in my family; also has osteopenia per her report  Eval: Patient reports that she has been having back pain for several years now, but it  has been getting worse. She was diagnosed with  scoliosis and she feels like she can feel her ribs on her right side. She can feel a pinch in both hips depending how she moves. She has fallen, but it was over six months ago (June 2025). She feels like it will pull her to the right side while walking, but it gets worse throughout the day.   PERTINENT HISTORY:  HTN, Raynaud's disease, Meniere disease, osteoporosis, CKD, fibromyalgia, OA, and Hashimoto's disease  PAIN:  Are you having pain? Yes: NPRS scale: Current: 2-3/10 Best: 3/10 Worst: 9/10  Pain location: low back and both hips  Pain description: pinch and catch  Aggravating factors: prolonged standing, twisting, stepping into tub  Relieving factors: stopping and resting  PRECAUTIONS: None  RED FLAGS: None   WEIGHT BEARING RESTRICTIONS: No  FALLS:  Has patient fallen in last 6 months? No  LIVING ENVIRONMENT: Lives with: lives alone Lives in: House/apartment Stairs: Yes: Internal: 10-12 steps; on right going up and on left going up and External: 3 steps; on right going up Has following equipment at home: Grab bars  OCCUPATION: retired  PLOF: Independent  PATIENT GOALS: reduced pain and be able to walk more (100 feet currently prior to pain)  NEXT MD VISIT: none scheduled  OBJECTIVE:  Note: Objective measures were completed at Evaluation unless otherwise noted.  DIAGNOSTIC FINDINGS: 02/27/24 lumbar MRI  IMPRESSION: 51 degree levoscoliosis with multilevel degenerative changes as outlined above.   There is no significant spinal stenosis.   There is severe facet arthropathy at L4-5 and L5-S1.   No acute abnormality.  No significant change from Oct 13, 2019  PATIENT SURVEYS:  Modified Oswestry:  MODIFIED OSWESTRY DISABILITY SCALE  Date: 05/26/24 Score  Pain intensity 3 =  Pain medication provides me with moderate relief from pain.  2. Personal care (washing, dressing, etc.) 2 =  It is painful to take care of myself, and I am slow and careful.  3. Lifting 3 = Pain  prevents me from lifting heavy weights, but I can manage light to medium weights if they are conveniently positioned  4. Walking 3 =  Pain prevents me from walking more than  mile.  5. Sitting 2 =  Pain prevents me from sitting more than 1 hour.  6. Standing 3 =  Pain prevents me from standing more than 1/2 hour.  7. Sleeping 4 =  Even when I take pain medication, I sleep less than 2 hour  8. Social Life 1 =  My social life is normal, but it increases my level of pain.  9. Traveling 0 =  I can travel anywhere without increased pain.  10. Employment/ Homemaking 1 = My normal homemaking/job activities increase my pain, but I can still perform all that is required of me  Total 22/50   Interpretation of scores: Score Category Description  0-20% Minimal Disability The patient can cope with most living activities. Usually no treatment is indicated apart from advice on lifting, sitting and exercise  21-40% Moderate Disability The patient experiences more pain and difficulty with sitting, lifting and standing. Travel and social life are more difficult and they may be disabled from work. Personal care, sexual activity and sleeping are not grossly affected, and the patient can usually be managed by conservative means  41-60% Severe Disability Pain remains the main problem in this group, but activities of daily living are affected. These patients require a detailed investigation  61-80% Crippled Back pain impinges  on all aspects of the patients life. Positive intervention is required  81-100% Bed-bound These patients are either bed-bound or exaggerating their symptoms  Bluford FORBES Zoe DELENA Karon DELENA, et al. Surgery versus conservative management of stable thoracolumbar fracture: the PRESTO feasibility RCT. [place] ([place]): [employer]; 2021 Nov. [facility] Technology Assessment, No. 25.62.) Appendix 3, Oswestry Disability Index category descriptors. Available from:  Findjewel[employer]cz  Minimally Clinically Important Difference (MCID) = 12.8%  COGNITION: Overall cognitive status: Within functional limits for tasks assessed     SENSATION: Light touch: Impaired  and diminished sensation throughout left lower extremity compared to the right Patient reports intermittent numbness in her right hand and toes  PALPATION: TTP: bilateral lumbar paraspinals (reproduced familiar radiating pain), L1-5 spinous process (reproduced familiar radiating pain), left QL, gluteals, and piriformis  LUMBAR ROM:   AROM eval  Flexion WFL   Extension 100% limited; familiar pain   Right lateral flexion 25% limited; painful pulling  Left lateral flexion 25% limited; unable to isolate side bending; painful pulling  Right rotation WFL   Left rotation 25% limited   (Blank rows = not tested)  LOWER EXTREMITY ROM:  WFL for activities assessed  LOWER EXTREMITY MMT:    MMT Right eval Left eval  Hip flexion 4-/5 3+/5  Hip extension    Hip abduction    Hip adduction    Hip internal rotation    Hip external rotation    Knee flexion 5/5 4/5  Knee extension 5/5 4+/5  Ankle dorsiflexion 4/5 4/5  Ankle plantarflexion    Ankle inversion    Ankle eversion     (Blank rows = not tested)  FUNCTIONAL TESTS:  5 times sit to stand: 23.84 seconds on 05/28/24 2 minute walk test: 334 feet on 05/28/24 Sit to stand: requires UE support   GAIT: Distance walked: 50 feet Assistive device utilized: None Level of assistance: Complete Independence   TREATMENT DATE:   06/10/2024 Decompression exercises 1-5; 5 hold x 8 reps Decompression with theraband 1-4; x 8 reps each Updated HEP                                                                                                                                                              05/28/24 EXERCISE LOG  Exercise Repetitions and Resistance Comments  Glute sets  2 minutes w/ 5 second hold    Double  knee to chest  2 minutes  With LE supported on green ball   Supine march  2 minutes  Alternating LE   5x STS  23.84 seconds    2 WMW  334 feet   Isometric ball press  2 minutes w/ 5 second hold    Standing hip ABD 3 minutes  Alternating LE   Nustep  L4 x  5 minutes     Blank cell = exercise not performed today   05/26/24: PT evaluation, patient education, and HEP    PATIENT EDUCATION:  Education details: HEP, healing, activity modification, and objective findings Person educated: Patient Education method: Explanation, Demonstration, and Handouts Education comprehension: verbalized understanding and returned demonstration  HOME EXERCISE PROGRAM: Access Code: [email or web address] URL: [email or web address] Date: 05/28/2024 Prepared by: Lacinda Fass  Exercises - Seated Abdominal Press into [employer]  - 1 x daily - 7 x weekly - 3 sets - 10 reps - 5 seconds hold - Supine Hip and Knee Flexion AROM with Swiss Ball  - 1 x daily - 7 x weekly - 3 sets - 10 reps  Access Code: [email or web address] URL: [email or web address] Date: 05/26/2024 Prepared by: Lacinda Fass  Exercises - Supine Active Straight Leg Raise  - 1 x daily - 7 x weekly - 2 sets - 10 reps - Hooklying Gluteal Sets  - 1 x daily - 7 x weekly - 2 sets - 10 reps - 5 seconds  hold  ASSESSMENT:  CLINICAL IMPRESSION: Introduced decompression exercises today due to patient scoliosis and osteopenia and symptomatic pain and debility.  She needs minimal cues for correct form with exercises today.  Issued handouts and red theraband for home use. Noted rib hump right side; pain left side low back and catching with sit to stand.   Patient will benefit from continued skilled therapy services to address deficits and promote return to optimal function.        Eval: Patient is a 84 y.o. 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  She presented  with moderate pain severity and irritability with lumbar extension and sidebending reproducing her familiar symptoms.  She also exhibited reduced left lower extremity muscular strength compared to the right lower extremity.  She was provided a home exercise program which she was able to properly demonstrate.  She reported understanding.  Recommend that she continue with skilled physical therapy to address her impairments to maximize her functional mobility.  OBJECTIVE IMPAIRMENTS: decreased activity tolerance, decreased mobility, difficulty walking, decreased ROM, decreased strength, hypomobility, impaired tone, and pain.   ACTIVITY LIMITATIONS: lifting, bending, sitting, standing, sleeping, transfers, and locomotion level  PARTICIPATION LIMITATIONS: cleaning, laundry, shopping, and community activity  PERSONAL FACTORS: Age, Past/current experiences, Time since onset of injury/illness/exacerbation, and 3+ comorbidities: HTN, Raynaud's disease, Meniere disease, osteoporosis, CKD, fibromyalgia, OA, and Hashimoto's disease are also affecting patient's functional outcome.   REHAB POTENTIAL: Fair    CLINICAL DECISION MAKING: Evolving/moderate complexity  EVALUATION COMPLEXITY: Moderate   GOALS: Goals reviewed with patient? Yes  SHORT TERM GOALS: Target date: 06/16/24  Patient will be independent with her initial HEP. Baseline: Goal status: INITIAL  2.  Patient will improve her left quadriceps to 5/5 for improved independence with transfers. Baseline:  Goal status: INITIAL  3.  Patient will report being able to walk at least 150 feet without being limited by her familiar symptoms. Baseline:  Goal status: INITIAL  LONG TERM GOALS: Target date: 07/07/24  Patient will be independent with her advanced HEP. Baseline:  Goal status: INITIAL  2.  Patient will be able to complete her daily activities without her familiar pain exceeding 6/10. Baseline:  Goal status: INITIAL  3.  Patient  will improve her ODI score to 15/50 or less for improved perceived function with her daily activities. Baseline:  Goal status: INITIAL  4.  Patient  will be able to transfer from sitting to standing without upper extremity support for improved functional mobility. Baseline:  Goal status: INITIAL  5.  Patient will report being able to stand for at least 40 minutes for improved function shopping. Baseline:  Goal status: INITIAL  PLAN:  PT FREQUENCY: 2x/week  PT DURATION: 6 weeks  PLANNED INTERVENTIONS: 97164- PT Re-evaluation, 97750- Physical Performance Testing, 97110-Therapeutic exercises, 97530- Therapeutic activity, [id number]- Neuromuscular re-education, 97535- Self Care, [id number]- Manual therapy, [phone]- Gait training, Patient/Family education, Balance training, Stair training, Joint mobilization, Spinal mobilization, Cryotherapy, and Moist heat.  PLAN FOR NEXT SESSION:  lumbar and lower extremity strengthening, and manual therapy as needed  12:36 PM, 06/10/24 Youa Deloney Small Dejanee Thibeaux MPT [facility] [place] [phone]   "

## 2024-06-12 ENCOUNTER — Ambulatory Visit (HOSPITAL_COMMUNITY)

## 2024-06-12 ENCOUNTER — Encounter (HOSPITAL_COMMUNITY): Payer: Self-pay

## 2024-06-12 DIAGNOSIS — M5459 Other low back pain: Secondary | ICD-10-CM | POA: Diagnosis not present

## 2024-06-12 DIAGNOSIS — M6281 Muscle weakness (generalized): Secondary | ICD-10-CM

## 2024-06-12 NOTE — Therapy (Signed)
 " OUTPATIENT PHYSICAL THERAPY THORACOLUMBAR TREATMENT   Patient Name: Audrey [name] MRN: [id number] DOB:1940/10/07, 84 y.o., female Today's Date: 06/12/2024  END OF SESSION:  PT End of Session - 06/12/24 1110     Visit Number 4    Number of Visits 12    Date for Recertification  08/14/24    Authorization Type [employer]    Authorization Time Period no authorization required    PT Start Time 1118    PT Stop Time 1200    PT Time Calculation (min) 42 min    Activity Tolerance Patient tolerated treatment well    Behavior During Therapy WFL for tasks assessed/performed           Past Medical History:  Diagnosis Date   Allergic rhinitis    Anxiety and depression    Arthritis    Basal cell carcinoma 2002   Chronic lymphocytic thyroiditis    Chronic pain syndrome    Colon polyp    Depression    Diverticulosis    Dyspnea    with excertion   Erosive esophagitis    Family history of heart disease    Fibromyalgia    Gastric polyps    GERD (gastroesophageal reflux disease)    Hashimoto's disease    Hiatal hernia    History of nuclear stress test 04/2010   nonischemic    HLD (hyperlipidemia)    HTN (hypertension)    Hypothyroidism    Interstitial cystitis    Meniere's disease    Obesity    Pinched vertebral nerve    PONV (postoperative nausea and vomiting)    Raynaud's syndrome    Right hip pain 08/18/2012   Sleep apnea    Tuberculosis    postive skin test no symptoms  was around mom that had it   Vitamin B12 deficiency    Past Surgical History:  Procedure Laterality Date   ABDOMINAL HYSTERECTOMY  1982   BALLOON DILATION N/A 11/30/2019   Procedure: BALLOON DILATION;  Surgeon: Nandigam, Kavitha V, MD;  Location: [facility];  Service: Endoscopy;  Laterality: N/A;   BASAL CELL CARCINOMA EXCISION  03-31-01   face   BLADDER REPAIR  2005   ovaries, tubes removed; pelvic prolapse corrected; rectal repair   BLADDER SURGERY  01-2009   with mesh placement    CATARACT EXTRACTION     bilateral   COLONOSCOPY W/ BIOPSIES AND POLYPECTOMY  12/20/2009   diverticulosis, adenomatous polyps   COLONOSCOPY WITH PROPOFOL  N/A 08/26/2017   Procedure: COLONOSCOPY WITH PROPOFOL ;  Surgeon: Avram Lupita BRAVO, MD;  Location: [facility];  Service: Endoscopy;  Laterality: N/A;   ENDOVENOUS ABLATION SAPHENOUS VEIN W/ LASER Left 08/07/2016   endovenous laser ablation left small saphenous vein by Lynwood Collum MD   ESOPHAGEAL MANOMETRY N/A 05/25/2013   Procedure: ESOPHAGEAL MANOMETRY (EM);  Surgeon: Lupita BRAVO Avram, MD;  Location: [facility];  Service: Endoscopy;  Laterality: N/A;   ESOPHAGOGASTRODUODENOSCOPY (EGD) WITH ESOPHAGEAL DILATION  06/09/2012   Procedure: ESOPHAGOGASTRODUODENOSCOPY (EGD) WITH ESOPHAGEAL DILATION;  Surgeon: Lupita BRAVO Avram, MD;  Location: [facility];  Service: Endoscopy;  Laterality: N/A;   ESOPHAGOGASTRODUODENOSCOPY (EGD) WITH PROPOFOL  N/A 11/30/2019   Procedure: ESOPHAGOGASTRODUODENOSCOPY (EGD) WITH PROPOFOL ;  Surgeon: Shila Gustav GAILS, MD;  Location: [facility];  Service: Endoscopy;  Laterality: N/A;   FOOT SURGERY  1999   bilateral   HAMMER TOE SURGERY Right 11/13/2017   Procedure: HAMMER TOE REPAIR RIGHT 4TH TOE AND 5TH TOE;  Surgeon: Blinda Katz, DPM;  Location:  [facility];  Service: Podiatry;  Laterality: Right;   INTERSTIM IMPLANT PLACEMENT  01/2012   [facility]   laser surgery bladder  05-25-10   on mesh   LIPOMA EXCISION     right arm   NASOLACRIMAL DUCT PROBING  09-12-10   right eye   NASOLACRIMAL DUCT PROBING  09-26-10   left eye   OSTECTOMY Right 11/13/2017   Procedure: OSTECTOMY RIGHT 5TH TOE;  Surgeon: Blinda Katz, DPM;  Location: [facility];  Service: Podiatry;  Laterality: Right;   removal of bladder mesh  03-01-11   [facility]   TONSILLECTOMY  1959   TRANSTHORACIC ECHOCARDIOGRAM  12/2006   EF=>55%; LA mild-mod dilated; RA mildly dilated; mild mitral annular calcif, mild MR; mod TR & elevated RVSP; mild pulm vavle regurg   UPPER  GASTROINTESTINAL ENDOSCOPY  12/20/2009   GERD   Patient Active Problem List   Diagnosis Date Noted   Sandie gland cyst of left breast 06/02/2024   CKD (chronic kidney disease) 03/31/2024   Osteoporosis 03/04/2024   B12 deficiency 03/04/2024   Lumbar radiculopathy 03/04/2024   Hiatal hernia    OSA (obstructive sleep apnea) 12/10/2018   Varicose veins of left lower extremity with complications 06/26/2016   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   Presbycusis of both ears 03/06/2016   Acquired hypothyroidism 09/24/2014   Recurrent UTI 09/22/2014   Erosion of implanted vaginal mesh to surrounding organ or tissue 05/05/2013   Essential hypertension 03/25/2013   Hyperlipidemia 03/25/2013   Lumbago 10/16/2012   Low back pain 08/18/2012   Right hip pain 08/18/2012   Overactive bladder 03/21/2012   S/P balloon dilatation of esophageal stricture 01/29/2012   Raynaud's disease 01/29/2012   Meniere disease 01/29/2012   Fibromyalgia 01/29/2012   Urge incontinence 12/27/2011   Atrophic vaginitis 12/27/2011   Family history of von Willebrand disease 06/26/2011   GERD 11/29/2009   Esophageal dysphagia 11/29/2009    PCP: Grooms, Curtis, PA-C   REFERRING PROVIDER: [name], MD   REFERRING DIAG: Other idiopathic scoliosis, thoracolumbar regio   Rationale for Evaluation and Treatment: Rehabilitation  THERAPY DIAG:  Other low back pain  Muscle weakness (generalized)  ONSET DATE: several years ago  SUBJECTIVE:                                                                                                                                                                                           SUBJECTIVE STATEMENT: Pt stated Rt side lower back pain scale, 3/10 sore achy pain.  Rt hip catches with movements when reached to pick up object off floor.  Pain increased while standing.  Has began the  decompression exercises at home with no questions.  Eval: Patient  reports that she has been having back pain for several years now, but it has been getting worse. She was diagnosed with scoliosis and she feels like she can feel her ribs on her right side. She can feel a pinch in both hips depending how she moves. She has fallen, but it was over six months ago (June 2025). She feels like it will pull her to the right side while walking, but it gets worse throughout the day.   PERTINENT HISTORY:  HTN, Raynaud's disease, Meniere disease, osteoporosis, CKD, fibromyalgia, OA, and Hashimoto's disease  PAIN:  Are you having pain? Yes: NPRS scale: Current: 2-3/10 Best: 3/10 Worst: 9/10  Pain location: low back and both hips  Pain description: pinch and catch  Aggravating factors: prolonged standing, twisting, stepping into tub  Relieving factors: stopping and resting  PRECAUTIONS: None  RED FLAGS: None   WEIGHT BEARING RESTRICTIONS: No  FALLS:  Has patient fallen in last 6 months? No  LIVING ENVIRONMENT: Lives with: lives alone Lives in: House/apartment Stairs: Yes: Internal: 10-12 steps; on right going up and on left going up and External: 3 steps; on right going up Has following equipment at home: Grab bars  OCCUPATION: retired  PLOF: Independent  PATIENT GOALS: reduced pain and be able to walk more (100 feet currently prior to pain)  NEXT MD VISIT: none scheduled  OBJECTIVE:  Note: Objective measures were completed at Evaluation unless otherwise noted.  DIAGNOSTIC FINDINGS: 02/27/24 lumbar MRI  IMPRESSION: 51 degree levoscoliosis with multilevel degenerative changes as outlined above.   There is no significant spinal stenosis.   There is severe facet arthropathy at L4-5 and L5-S1.   No acute abnormality.  No significant change from Oct 13, 2019  PATIENT SURVEYS:  Modified Oswestry:  MODIFIED OSWESTRY DISABILITY SCALE  Date: 05/26/24 Score  Pain intensity 3 =  Pain medication provides me with moderate relief from pain.  2. Personal  care (washing, dressing, etc.) 2 =  It is painful to take care of myself, and I am slow and careful.  3. Lifting 3 = Pain prevents me from lifting heavy weights, but I can manage light to medium weights if they are conveniently positioned  4. Walking 3 =  Pain prevents me from walking more than  mile.  5. Sitting 2 =  Pain prevents me from sitting more than 1 hour.  6. Standing 3 =  Pain prevents me from standing more than 1/2 hour.  7. Sleeping 4 =  Even when I take pain medication, I sleep less than 2 hour  8. Social Life 1 =  My social life is normal, but it increases my level of pain.  9. Traveling 0 =  I can travel anywhere without increased pain.  10. Employment/ Homemaking 1 = My normal homemaking/job activities increase my pain, but I can still perform all that is required of me  Total 22/50   Interpretation of scores: Score Category Description  0-20% Minimal Disability The patient can cope with most living activities. Usually no treatment is indicated apart from advice on lifting, sitting and exercise  21-40% Moderate Disability The patient experiences more pain and difficulty with sitting, lifting and standing. Travel and social life are more difficult and they may be disabled from work. Personal care, sexual activity and sleeping are not grossly affected, and the patient can usually be managed by conservative means  41-60% Severe Disability Pain remains the  main problem in this group, but activities of daily living are affected. These patients require a detailed investigation  61-80% Crippled Back pain impinges on all aspects of the patients life. Positive intervention is required  81-100% Bed-bound These patients are either bed-bound or exaggerating their symptoms  Bluford FORBES Zoe DELENA Karon DELENA, et al. Surgery versus conservative management of stable thoracolumbar fracture: the PRESTO feasibility RCT. [place] ([place]): [employer]; 2021 Nov. [facility] Technology Assessment,  No. 25.62.) Appendix 3, Oswestry Disability Index category descriptors. Available from: Findjewelers.cz  Minimally Clinically Important Difference (MCID) = 12.8%  COGNITION: Overall cognitive status: Within functional limits for tasks assessed     SENSATION: Light touch: Impaired  and diminished sensation throughout left lower extremity compared to the right Patient reports intermittent numbness in her right hand and toes  PALPATION: TTP: bilateral lumbar paraspinals (reproduced familiar radiating pain), L1-5 spinous process (reproduced familiar radiating pain), left QL, gluteals, and piriformis  LUMBAR ROM:   AROM eval  Flexion WFL   Extension 100% limited; familiar pain   Right lateral flexion 25% limited; painful pulling  Left lateral flexion 25% limited; unable to isolate side bending; painful pulling  Right rotation WFL   Left rotation 25% limited   (Blank rows = not tested)  LOWER EXTREMITY ROM:  WFL for activities assessed  LOWER EXTREMITY MMT:    MMT Right eval Left eval  Hip flexion 4-/5 3+/5  Hip extension    Hip abduction    Hip adduction    Hip internal rotation    Hip external rotation    Knee flexion 5/5 4/5  Knee extension 5/5 4+/5  Ankle dorsiflexion 4/5 4/5  Ankle plantarflexion    Ankle inversion    Ankle eversion     (Blank rows = not tested)  FUNCTIONAL TESTS:  5 times sit to stand: 23.84 seconds on 05/28/24 2 minute walk test: 334 feet on 05/28/24 Sit to stand: requires UE support   GAIT: Distance walked: 50 feet Assistive device utilized: None Level of assistance: Complete Independence   TREATMENT DATE:   06/12/24: Sidelying:  - Clam 10x 5 Supine:  - Bridge 10x 5 partial raise  - DKTC with feet on green ball x 2 min  - Dead bug with knee flexed, abdominal sets 10x STS hands on thigh elevated height 19.5 to assist, eccentric control Standing:  - Heel raise with cueing for posture 10x with UE support  incline   - Toe raises with UE support decline slope  - Abduction 10x  - Sidestep 2RT inside //bars with UE support.  06/10/2024 Decompression exercises 1-5; 5 hold x 8 reps Decompression with theraband 1-4; x 8 reps each Updated HEP  05/28/24 EXERCISE LOG  Exercise Repetitions and Resistance Comments  Glute sets  2 minutes w/ 5 second hold    Double knee to chest  2 minutes  With LE supported on green ball   Supine march  2 minutes  Alternating LE   5x STS  23.84 seconds    2 WMW  334 feet   Isometric ball press  2 minutes w/ 5 second hold    Standing hip ABD 3 minutes  Alternating LE   Nustep  L4 x 5 minutes     Blank cell = exercise not performed today   05/26/24: PT evaluation, patient education, and HEP    PATIENT EDUCATION:  Education details: HEP, healing, activity modification, and objective findings Person educated: Patient Education method: Explanation, Demonstration, and Handouts Education comprehension: verbalized understanding and returned demonstration  HOME EXERCISE PROGRAM: Access Code: [email or web address] URL: [email or web address] Date: 05/28/2024 Prepared by: Lacinda Fass  Exercises - Seated Abdominal Press into [employer]  - 1 x daily - 7 x weekly - 3 sets - 10 reps - 5 seconds hold - Supine Hip and Knee Flexion AROM with Swiss Ball  - 1 x daily - 7 x weekly - 3 sets - 10 reps  Access Code: [email or web address] URL: [email or web address] Date: 05/26/2024 Prepared by: Lacinda Fass  Exercises - Supine Active Straight Leg Raise  - 1 x daily - 7 x weekly - 2 sets - 10 reps - Hooklying Gluteal Sets  - 1 x daily - 7 x weekly - 2 sets - 10 reps - 5 seconds  hold  ASSESSMENT:  CLINICAL IMPRESSION: Session focus with core and proximal strengthening.  Added several new exercises with good  tolerance.  No reports of catching through session.  Progressed to standing exercises for general LE strengthening.  PT tolerated well to session, does continue to have some pain in lower back.  Eval: Patient is a 84 y.o. 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  She presented with moderate pain severity and irritability with lumbar extension and sidebending reproducing her familiar symptoms.  She also exhibited reduced left lower extremity muscular strength compared to the right lower extremity.  She was provided a home exercise program which she was able to properly demonstrate.  She reported understanding.  Recommend that she continue with skilled physical therapy to address her impairments to maximize her functional mobility.  OBJECTIVE IMPAIRMENTS: decreased activity tolerance, decreased mobility, difficulty walking, decreased ROM, decreased strength, hypomobility, impaired tone, and pain.   ACTIVITY LIMITATIONS: lifting, bending, sitting, standing, sleeping, transfers, and locomotion level  PARTICIPATION LIMITATIONS: cleaning, laundry, shopping, and community activity  PERSONAL FACTORS: Age, Past/current experiences, Time since onset of injury/illness/exacerbation, and 3+ comorbidities: HTN, Raynaud's disease, Meniere disease, osteoporosis, CKD, fibromyalgia, OA, and Hashimoto's disease are also affecting patient's functional outcome.   REHAB POTENTIAL: Fair    CLINICAL DECISION MAKING: Evolving/moderate complexity  EVALUATION COMPLEXITY: Moderate   GOALS: Goals reviewed with patient? Yes  SHORT TERM GOALS: Target date: 06/16/24  Patient will be independent with her initial HEP. Baseline: Goal status: INITIAL  2.  Patient will improve her left quadriceps to 5/5 for improved independence with transfers. Baseline:  Goal status: INITIAL  3.  Patient will report being able to walk at least 150  feet without being limited by her familiar symptoms. Baseline:  Goal status: INITIAL  LONG TERM GOALS: Target date: 07/07/24  Patient will be independent with her  advanced HEP. Baseline:  Goal status: INITIAL  2.  Patient will be able to complete her daily activities without her familiar pain exceeding 6/10. Baseline:  Goal status: INITIAL  3.  Patient will improve her ODI score to 15/50 or less for improved perceived function with her daily activities. Baseline:  Goal status: INITIAL  4.  Patient will be able to transfer from sitting to standing without upper extremity support for improved functional mobility. Baseline:  Goal status: INITIAL  5.  Patient will report being able to stand for at least 40 minutes for improved function shopping. Baseline:  Goal status: INITIAL  PLAN:  PT FREQUENCY: 2x/week  PT DURATION: 6 weeks  PLANNED INTERVENTIONS: 97164- PT Re-evaluation, 97750- Physical Performance Testing, 97110-Therapeutic exercises, 97530- Therapeutic activity, [id number]- Neuromuscular re-education, 97535- Self Care, [id number]- Manual therapy, [phone]- Gait training, Patient/Family education, Balance training, Stair training, Joint mobilization, Spinal mobilization, Cryotherapy, and Moist heat.  PLAN FOR NEXT SESSION:  lumbar and lower extremity strengthening, and manual therapy as needed  Augustin Mclean, LPTA/CLT; CBIS [phone]  12:07 PM, 06/12/24    "

## 2024-06-16 ENCOUNTER — Ambulatory Visit (HOSPITAL_COMMUNITY): Admitting: Physical Therapy

## 2024-06-19 ENCOUNTER — Ambulatory Visit (HOSPITAL_COMMUNITY)

## 2024-06-23 ENCOUNTER — Ambulatory Visit (HOSPITAL_COMMUNITY)

## 2024-06-26 ENCOUNTER — Ambulatory Visit (HOSPITAL_COMMUNITY)

## 2024-06-26 ENCOUNTER — Ambulatory Visit

## 2024-06-26 DIAGNOSIS — E538 Deficiency of other specified B group vitamins: Secondary | ICD-10-CM

## 2024-06-26 MED ORDER — CYANOCOBALAMIN 1000 MCG/ML IJ SOLN
1000.0000 ug | Freq: Once | INTRAMUSCULAR | Status: AC
  Administered 2024-06-26: 1000 ug via INTRAMUSCULAR

## 2024-06-30 ENCOUNTER — Ambulatory Visit (HOSPITAL_COMMUNITY)

## 2024-07-03 ENCOUNTER — Ambulatory Visit (HOSPITAL_COMMUNITY)

## 2024-07-07 ENCOUNTER — Ambulatory Visit (HOSPITAL_COMMUNITY)

## 2024-07-10 ENCOUNTER — Ambulatory Visit (HOSPITAL_COMMUNITY)

## 2024-07-24 ENCOUNTER — Ambulatory Visit

## 2024-07-31 ENCOUNTER — Ambulatory Visit

## 2024-08-31 ENCOUNTER — Ambulatory Visit: Admitting: Physician Assistant
